# Patient Record
Sex: Female | Born: 1975 | Race: White | Hispanic: No | Marital: Single | Smoking: Current every day smoker
Health system: Southern US, Community
[De-identification: ages and names within clinical notes are randomized; demographics above are authoritative.]

## PROBLEM LIST (undated history)

## (undated) DIAGNOSIS — F32A Depression, unspecified: Secondary | ICD-10-CM

## (undated) DIAGNOSIS — E109 Type 1 diabetes mellitus without complications: Secondary | ICD-10-CM

## (undated) DIAGNOSIS — F419 Anxiety disorder, unspecified: Secondary | ICD-10-CM

## (undated) DIAGNOSIS — N83209 Unspecified ovarian cyst, unspecified side: Secondary | ICD-10-CM

## (undated) DIAGNOSIS — I209 Angina pectoris, unspecified: Secondary | ICD-10-CM

## (undated) DIAGNOSIS — F329 Major depressive disorder, single episode, unspecified: Secondary | ICD-10-CM

## (undated) DIAGNOSIS — E785 Hyperlipidemia, unspecified: Secondary | ICD-10-CM

## (undated) DIAGNOSIS — K219 Gastro-esophageal reflux disease without esophagitis: Secondary | ICD-10-CM

## (undated) HISTORY — PX: OTHER SURGICAL HISTORY: SHX169

## (undated) HISTORY — PX: COLONOSCOPY: SHX174

## (undated) HISTORY — PX: TUBAL LIGATION: SHX77

## (undated) HISTORY — PX: UPPER GASTROINTESTINAL ENDOSCOPY: SHX188

## (undated) HISTORY — PX: LAPAROSCOPIC SALPINGOOPHERECTOMY: SUR795

## (undated) HISTORY — DX: Hyperlipidemia, unspecified: E78.5

## (undated) HISTORY — PX: CARPAL TUNNEL RELEASE: SHX101

## (undated) HISTORY — DX: Major depressive disorder, single episode, unspecified: F32.9

## (undated) HISTORY — PX: ENDOMETRIAL ABLATION: SHX621

## (undated) HISTORY — DX: Depression, unspecified: F32.A

## (undated) HISTORY — PX: WISDOM TOOTH EXTRACTION: SHX21

## (undated) HISTORY — PX: ABDOMINAL HYSTERECTOMY: SHX81

## (undated) HISTORY — PX: TRIGGER FINGER RELEASE: SHX641

---

## 1998-12-20 ENCOUNTER — Other Ambulatory Visit: Admission: RE | Admit: 1998-12-20 | Discharge: 1998-12-20 | Payer: Self-pay | Admitting: Obstetrics and Gynecology

## 1999-10-02 ENCOUNTER — Other Ambulatory Visit: Admission: RE | Admit: 1999-10-02 | Discharge: 1999-10-02 | Payer: Self-pay | Admitting: Obstetrics and Gynecology

## 1999-10-27 ENCOUNTER — Observation Stay (HOSPITAL_COMMUNITY): Admission: AD | Admit: 1999-10-27 | Discharge: 1999-10-27 | Payer: Self-pay | Admitting: Obstetrics and Gynecology

## 2000-04-01 ENCOUNTER — Ambulatory Visit (HOSPITAL_COMMUNITY): Admission: RE | Admit: 2000-04-01 | Discharge: 2000-04-01 | Payer: Self-pay | Admitting: Obstetrics and Gynecology

## 2000-04-06 ENCOUNTER — Inpatient Hospital Stay (HOSPITAL_COMMUNITY): Admission: AD | Admit: 2000-04-06 | Discharge: 2000-04-09 | Payer: Self-pay | Admitting: Obstetrics and Gynecology

## 2000-05-05 ENCOUNTER — Other Ambulatory Visit: Admission: RE | Admit: 2000-05-05 | Discharge: 2000-05-05 | Payer: Self-pay | Admitting: Obstetrics and Gynecology

## 2000-06-11 ENCOUNTER — Encounter: Admission: RE | Admit: 2000-06-11 | Discharge: 2000-06-11 | Payer: Self-pay | Admitting: Obstetrics and Gynecology

## 2000-06-11 ENCOUNTER — Encounter: Payer: Self-pay | Admitting: Obstetrics and Gynecology

## 2001-05-12 ENCOUNTER — Other Ambulatory Visit: Admission: RE | Admit: 2001-05-12 | Discharge: 2001-05-12 | Payer: Self-pay | Admitting: Obstetrics and Gynecology

## 2002-05-31 ENCOUNTER — Other Ambulatory Visit: Admission: RE | Admit: 2002-05-31 | Discharge: 2002-05-31 | Payer: Self-pay | Admitting: Obstetrics and Gynecology

## 2003-06-04 ENCOUNTER — Other Ambulatory Visit: Admission: RE | Admit: 2003-06-04 | Discharge: 2003-06-04 | Payer: Self-pay | Admitting: Obstetrics and Gynecology

## 2005-02-19 ENCOUNTER — Encounter (INDEPENDENT_AMBULATORY_CARE_PROVIDER_SITE_OTHER): Payer: Self-pay | Admitting: Specialist

## 2005-02-19 ENCOUNTER — Ambulatory Visit (HOSPITAL_COMMUNITY): Admission: RE | Admit: 2005-02-19 | Discharge: 2005-02-19 | Payer: Self-pay | Admitting: Obstetrics and Gynecology

## 2007-10-07 ENCOUNTER — Ambulatory Visit (HOSPITAL_COMMUNITY): Admission: RE | Admit: 2007-10-07 | Discharge: 2007-10-07 | Payer: Self-pay | Admitting: Internal Medicine

## 2008-05-24 ENCOUNTER — Telehealth (INDEPENDENT_AMBULATORY_CARE_PROVIDER_SITE_OTHER): Payer: Self-pay | Admitting: *Deleted

## 2008-05-31 ENCOUNTER — Ambulatory Visit: Payer: Self-pay | Admitting: Gastroenterology

## 2008-05-31 DIAGNOSIS — E785 Hyperlipidemia, unspecified: Secondary | ICD-10-CM | POA: Insufficient documentation

## 2008-05-31 DIAGNOSIS — K921 Melena: Secondary | ICD-10-CM | POA: Insufficient documentation

## 2008-05-31 DIAGNOSIS — K219 Gastro-esophageal reflux disease without esophagitis: Secondary | ICD-10-CM | POA: Insufficient documentation

## 2008-05-31 DIAGNOSIS — F329 Major depressive disorder, single episode, unspecified: Secondary | ICD-10-CM

## 2008-05-31 DIAGNOSIS — F3289 Other specified depressive episodes: Secondary | ICD-10-CM | POA: Insufficient documentation

## 2008-05-31 DIAGNOSIS — K625 Hemorrhage of anus and rectum: Secondary | ICD-10-CM | POA: Insufficient documentation

## 2008-05-31 DIAGNOSIS — R197 Diarrhea, unspecified: Secondary | ICD-10-CM | POA: Insufficient documentation

## 2008-05-31 DIAGNOSIS — E119 Type 2 diabetes mellitus without complications: Secondary | ICD-10-CM | POA: Insufficient documentation

## 2008-06-01 DIAGNOSIS — E109 Type 1 diabetes mellitus without complications: Secondary | ICD-10-CM | POA: Insufficient documentation

## 2008-07-05 ENCOUNTER — Ambulatory Visit: Payer: Self-pay | Admitting: Gastroenterology

## 2008-07-11 ENCOUNTER — Telehealth: Payer: Self-pay | Admitting: Gastroenterology

## 2009-03-27 ENCOUNTER — Telehealth: Payer: Self-pay | Admitting: Gastroenterology

## 2009-08-08 ENCOUNTER — Telehealth: Payer: Self-pay | Admitting: Gastroenterology

## 2009-09-11 ENCOUNTER — Telehealth: Payer: Self-pay | Admitting: Gastroenterology

## 2009-09-12 ENCOUNTER — Ambulatory Visit: Payer: Self-pay | Admitting: Internal Medicine

## 2009-09-12 DIAGNOSIS — R1013 Epigastric pain: Secondary | ICD-10-CM | POA: Insufficient documentation

## 2009-09-16 ENCOUNTER — Ambulatory Visit (HOSPITAL_COMMUNITY): Admission: RE | Admit: 2009-09-16 | Discharge: 2009-09-16 | Payer: Self-pay | Admitting: Internal Medicine

## 2009-09-17 ENCOUNTER — Telehealth: Payer: Self-pay | Admitting: Internal Medicine

## 2009-10-07 ENCOUNTER — Telehealth: Payer: Self-pay | Admitting: Physician Assistant

## 2009-10-10 ENCOUNTER — Encounter: Payer: Self-pay | Admitting: Physician Assistant

## 2009-11-05 ENCOUNTER — Telehealth: Payer: Self-pay | Admitting: Gastroenterology

## 2009-11-18 ENCOUNTER — Ambulatory Visit: Payer: Self-pay | Admitting: Gastroenterology

## 2009-11-18 DIAGNOSIS — R079 Chest pain, unspecified: Secondary | ICD-10-CM | POA: Insufficient documentation

## 2009-12-16 ENCOUNTER — Ambulatory Visit (HOSPITAL_COMMUNITY): Admission: RE | Admit: 2009-12-16 | Discharge: 2009-12-16 | Payer: Self-pay | Admitting: Gastroenterology

## 2010-01-10 ENCOUNTER — Encounter: Admission: RE | Admit: 2010-01-10 | Discharge: 2010-01-10 | Payer: Self-pay | Admitting: Obstetrics and Gynecology

## 2010-11-27 ENCOUNTER — Telehealth: Payer: Self-pay | Admitting: Gastroenterology

## 2010-12-23 ENCOUNTER — Telehealth: Payer: Self-pay | Admitting: Gastroenterology

## 2011-01-07 ENCOUNTER — Ambulatory Visit
Admission: RE | Admit: 2011-01-07 | Discharge: 2011-01-07 | Payer: Self-pay | Source: Home / Self Care | Attending: Gastroenterology | Admitting: Gastroenterology

## 2011-01-15 NOTE — Progress Notes (Signed)
Summary: refill request  Phone Note Call from Patient Call back at [phone]   Caller: Patient Call For: Dr. Russella Dar Reason for Call: Talk to Nurse Summary of Call: needs refill of Dexilant until ov in January... [facility] [address] Initial call taken by: Vallarie Mare,  November 27, 2010 8:33 AM  Follow-up for Phone Call        Rx was sent to pts pharmacy and pt notified to keep appt in January for further refills. Follow-up by: Christie Nottingham CMA Duncan Dull),  November 27, 2010 8:37 AM    New/Updated Medications: DEXILANT 60 MG CPDR (DEXLANSOPRAZOLE) one tablet by mouth once daily Keep appt in January Prescriptions: DEXILANT 60 MG CPDR (DEXLANSOPRAZOLE) one tablet by mouth once daily Keep appt in January  #30 x 0   Entered by:   Christie Nottingham CMA (AAMA)   Authorized by:   Meryl Dare MD [facility]   Signed by:   Christie Nottingham CMA (AAMA) on 11/27/2010   Method used:   [facility]        [facility]  [address] [id number]       [address]       [place], [place]  [address]       Ph: [phone]       Fax: [phone]   RxID:   [id number]

## 2011-01-15 NOTE — Assessment & Plan Note (Signed)
Summary: REFILL DEXILANT...AS.   History of Present Illness Visit Type: Follow-up Visit Primary GI MD: Elie Goody MD [facility] Primary Provider: Geoffry Paradise, MD Requesting Provider: n/a Chief Complaint: F/u for GERD, pt denies any GI complaints. Pt needs refill on Dexilant  History of Present Illness:   Sheryl White has good control of her reflux symptoms. She occasionally notes breakthrough symptoms that respond to [facility]. Her gastric emptying scan was normal.   GI Review of Systems      Denies abdominal pain, acid reflux, belching, bloating, chest pain, dysphagia with liquids, dysphagia with solids, heartburn, loss of appetite, nausea, vomiting, vomiting blood, weight loss, and  weight gain.        Denies anal fissure, black tarry stools, change in bowel habit, constipation, diarrhea, diverticulosis, fecal incontinence, heme positive stool, hemorrhoids, irritable bowel syndrome, jaundice, light color stool, liver problems, rectal bleeding, and  rectal pain.   Current Medications (verified): 1)  Humalog Mix 50/50 50-50 %  Susp (Insulin Lispro Prot & Lispro) .... Pump 2)  Dexilant 60 Mg Cpdr (Dexlansoprazole) .... One Tablet By Mouth Once Daily Keep Appt in January 3)  Tums 500 Mg Chew (Calcium Carbonate Antacid) .... As Needed  Allergies (verified): No Known Drug Allergies  Past History:  Past Medical History: Reviewed history from 09/12/2009 and no changes required. Diabetes Mellitus 1987 GERD  Past Surgical History: Reviewed history from 09/12/2009 and no changes required. C-Section Carpal Tunnel Release Finger Release x 6  Family History: Reviewed history from 05/31/2008 and no changes required. No FH of Colon Cancer: Family History of Colon Polyps: Father Family History of Diabetes: Son, Sister  Social History: Reviewed history from 05/31/2008 and no changes required. Married Occupation: Share Engineer, materials. Patient currently smokes. 1 pack/3 days Alcohol Use -  no Daily Caffeine Use 3 cup/day Illicit Drug Use - no Patient does not get regular exercise.   Review of Systems       The pertinent positives and negatives are noted as above and in the HPI. All other ROS were reviewed and were negative.   Vital Signs:  Patient profile:   35 year old female Height:      66 inches Weight:      209 pounds BMI:     33.86 BSA:     2.04 Pulse rate:   88 / minute Pulse rhythm:   regular BP sitting:   116 / 74  (left arm) Cuff size:   regular  Vitals Entered By: Ok Anis CMA (January 07, 2011 3:28 PM)  Physical Exam  General:  Well developed, well nourished, no acute distress. obese.   Head:  Normocephalic and atraumatic. Eyes:  PERRLA, no icterus. Ears:  Normal auditory acuity. Mouth:  No deformity or lesions, dentition normal. Lungs:  Clear throughout to auscultation. Heart:  Regular rate and rhythm; no murmurs, rubs,  or bruits. Abdomen:  Soft, nontender and nondistended. No masses, hepatosplenomegaly or hernias noted. Normal bowel sounds. Psych:  Alert and cooperative. Normal mood and affect.  Impression & Recommendations:  Problem # 1:  GERD (ICD-530.81) Continue Dexilant 60 mg q.a.m. and standard antireflux measures.  Problem # 2:  DIABETES MELLITUS, TYPE I (ICD-250.01)  Patient Instructions: 1)  Your prescription has been sent to your pharmacy.  2)  Please continue current medications.  3)  Please schedule a follow-up appointment in 1 year. 4)  Copy sent to : Geoffry Paradise, MD 5)  The medication list was reviewed and reconciled.  All changed /  newly prescribed medications were explained.  A complete medication list was provided to the patient / caregiver.  Prescriptions: DEXILANT 60 MG CPDR (DEXLANSOPRAZOLE) one tablet by mouth once daily  #34 x 11   Entered by:   Christie Nottingham CMA (AAMA)   Authorized by:   Meryl Dare MD [facility]   Signed by:   Christie Nottingham CMA (AAMA) on 01/07/2011   Method used:   [facility]          [facility]  [address] [id number]       [address]       [place], [place]  [address]       Ph: [phone]       Fax: [phone]   RxID:   [id number]

## 2011-01-15 NOTE — Progress Notes (Signed)
Summary: Medication refill  Phone Note Call from Patient Call back at Home Phone [phone] Call back at [phone]   Caller: Patient Call For: Dr. Russella Dar Reason for Call: Talk to Nurse Summary of Call: Pt is calling because she only has two dexilant pills left to last untill her appt and she wants another refill Initial call taken by: [place] Johnson,  December 23, 2010 9:12 AM  Follow-up for Phone Call        Rx was sent to pts pharmacy and pt told to keep appt this month. Pt agreed. Follow-up by: Christie Nottingham CMA Duncan Dull),  December 23, 2010 9:21 AM    Prescriptions: DEXILANT 60 MG CPDR (DEXLANSOPRAZOLE) one tablet by mouth once daily Keep appt in January  #30 x 0   Entered by:   Christie Nottingham CMA (AAMA)   Authorized by:   Meryl Dare MD [facility]   Signed by:   Christie Nottingham CMA (AAMA) on 12/23/2010   Method used:   [facility]        [facility]  [address] [id number]       [address]       [place], [place]  [address]       Ph: [phone]       Fax: [phone]   RxID:   [id number]

## 2011-01-20 ENCOUNTER — Encounter: Payer: Self-pay | Admitting: Gastroenterology

## 2011-02-04 NOTE — Medication Information (Signed)
Summary: Dexilant approved/[employer]  Dexilant approved/[employer]   Imported By: Sherian Rein 01/26/2011 14:45:10  _____________________________________________________________________  External Attachment:    Type:   Image     Comment:   External Document

## 2011-04-10 ENCOUNTER — Encounter (HOSPITAL_COMMUNITY)
Admission: RE | Admit: 2011-04-10 | Discharge: 2011-04-10 | Disposition: A | Discharge status: Home / Self Care | Payer: 59 | Source: Ambulatory Visit | Attending: Obstetrics and Gynecology | Admitting: Obstetrics and Gynecology

## 2011-04-10 LAB — CBC
Hemoglobin: 15.1 g/dL — ABNORMAL HIGH (ref 12.0–15.0)
MCH: 31.4 pg (ref 26.0–34.0)
MCHC: 34.9 g/dL (ref 30.0–36.0)
MCV: 90 fL (ref 78.0–100.0)
RBC: 4.81 MIL/uL (ref 3.87–5.11)

## 2011-04-10 LAB — BASIC METABOLIC PANEL
BUN: 7 mg/dL (ref 6–23)
CO2: 25 mEq/L (ref 19–32)
Calcium: 8.9 mg/dL (ref 8.4–10.5)
Chloride: 105 mEq/L (ref 96–112)
Creatinine, Ser: 0.67 mg/dL (ref 0.4–1.2)
GFR calc Af Amer: >60 mL/min (ref >60)
Glucose, Bld: 189 mg/dL — ABNORMAL HIGH (ref 70–99)

## 2011-04-10 LAB — SURGICAL PCR SCREEN: MRSA, PCR: NEGATIVE

## 2011-04-16 ENCOUNTER — Ambulatory Visit (HOSPITAL_COMMUNITY)
Admission: RE | Admit: 2011-04-16 | Discharge: 2011-04-16 | Disposition: A | Discharge status: Home / Self Care | Payer: 59 | Source: Ambulatory Visit | Attending: Obstetrics and Gynecology | Admitting: Obstetrics and Gynecology

## 2011-04-16 ENCOUNTER — Other Ambulatory Visit: Payer: Self-pay | Admitting: Obstetrics and Gynecology

## 2011-04-16 DIAGNOSIS — Z01812 Encounter for preprocedural laboratory examination: Secondary | ICD-10-CM | POA: Insufficient documentation

## 2011-04-16 DIAGNOSIS — IMO0002 Reserved for concepts with insufficient information to code with codable children: Secondary | ICD-10-CM | POA: Insufficient documentation

## 2011-04-16 DIAGNOSIS — Z01818 Encounter for other preprocedural examination: Secondary | ICD-10-CM | POA: Insufficient documentation

## 2011-04-16 DIAGNOSIS — N803 Endometriosis of pelvic peritoneum, unspecified: Secondary | ICD-10-CM | POA: Insufficient documentation

## 2011-04-16 DIAGNOSIS — N802 Endometriosis of fallopian tube: Secondary | ICD-10-CM | POA: Insufficient documentation

## 2011-04-16 DIAGNOSIS — R1031 Right lower quadrant pain: Secondary | ICD-10-CM | POA: Insufficient documentation

## 2011-04-16 DIAGNOSIS — N80209 Endometriosis of unspecified fallopian tube, unspecified depth: Secondary | ICD-10-CM | POA: Insufficient documentation

## 2011-04-16 LAB — GLUCOSE, CAPILLARY
Glucose-Capillary: 244 mg/dL — ABNORMAL HIGH (ref 70–99)
Glucose-Capillary: 204 mg/dL — ABNORMAL HIGH (ref 70–99)
Glucose-Capillary: 255 mg/dL — ABNORMAL HIGH (ref 70–99)

## 2011-04-21 NOTE — H&P (Signed)
  Sheryl White, Sheryl White               ACCOUNT NO.:  [id number]  MEDICAL RECORD NO.:  [id number]           PATIENT TYPE:  LOCATION:                                 FACILITY:  PHYSICIAN:  Lenoard Aden, M.D.DATE OF BIRTH:  [birth year]  DATE OF ADMISSION:  04/16/2011 DATE OF DISCHARGE:                             HISTORY & PHYSICAL   CHIEF COMPLAINT:  Persistent right lower quadrant pain with dyspareunia.  She is a 35 year old white female G4, P2, history of NovaSure endometrial ablation in 2006, history of C-section in 2001, who presents now with worsening right lower quadrant pain over the last 2-3 months. She has had worsening pain during intercourse.  No vaginal bleeding status post NovaSure.  No GI or GU symptomatology and normal ultrasound. She presents now for diagnostic laparoscopy, definitive evaluation, and perform tubal ligation and possible right salpingo-oophorectomy.  ALLERGIES:  She has no known drug allergies.  MEDICATIONS:  Vicodin as needed for pain, progestin-only birth control pills, insulin, Dexilant.  She has a pregnancy history of 1 vaginal delivery, 1 C-section, and 2 miscarriages, and surgical history, otherwise, is endometrial ablation. The patient had a questionable history of a laparoscopy but this operative note cannot be found.  She has a medical history remarkable for HSV and diabetes mellitus.  PHYSICAL EXAMINATION:  GENERAL:  She is a well-developed, well-nourished white female.  Height of 5 feet 6 inches, weight of 250 pounds. HEENT:  Normal. NECK:  Supple.  Full range of motion. LUNGS:  Clear. HEART:  Regular rhythm. ABDOMEN:  Soft, nontender. PELVIC:  Uterus felt to be anteverted.  No adnexal masses. EXTREMITIES:  There are no cords. NEUROLOGIC:  Nonfocal. SKIN:  Intact.  IMPRESSION: 1. Persistent right lower quadrant pain of questionable etiology. 2. Desire for elective sterilization.  PLAN:  Diagnostic laparoscopy, possible RSO,  possible lysis of adhesions, tubal ligation.  Risks of anesthesia, infection, bleeding, injury to abdominal organs, need for repair, delayed versus immediate complications to include bowel and bladder injury.  The patient acknowledges and wishes to proceed.  Inability to __________ was also discussed despite surgical options.     Lenoard Aden, M.D.     RJT/MEDQ  D:  04/15/2011  T:  04/16/2011  Job:  [id number]  Electronically Signed by Olivia Mackie M.D. on 04/21/2011 02:45:29 PM

## 2011-04-21 NOTE — Op Note (Signed)
Sheryl White, Sheryl White               ACCOUNT NO.:  [id number]  MEDICAL RECORD NO.:  [id number]           PATIENT TYPE:  O  LOCATION:  [facility]                          [facility]  PHYSICIAN:  Lenoard Aden, M.D.DATE OF BIRTH:  1976/12/10  DATE OF PROCEDURE:  04/16/2011 DATE OF DISCHARGE:                              OPERATIVE REPORT   PREOPERATIVE DIAGNOSES: 1. Right lower quadrant pain. 2. Desire for elective sterilization.  POSTOPERATIVE DIAGNOSES: 1. Right lower quadrant pain. 2. Desire for elective sterilization. 3. Pelvic endometriosis.  PROCEDURES: 1. Diagnostic laparoscopy. 2. Laparoscopic tubal ligation of the left tube. 3. Ablation of endometriosis in the cul-de-sac. 4. Right salpingo-oophorectomy.  SURGEON:  Lenoard Aden, MD  ASSISTANT:  Marlinda Mike, CNM  ANESTHESIA:  General.  ESTIMATED BLOOD LOSS:  Less than 30 mL.  COMPLICATIONS:  None.  DRAINS:  Foley.  COUNTS:  Correct.  DISPOSITION:  The patient went to recovery in good condition.  BRIEF OPERATIVE NOTE:  After being apprised of the risks of anesthesia, infection, bleeding, injury to abdominal organs, need for repair, delayed versus immediate complications including bowel and bladder injury, possible need for repair, and inability to cure pelvic pain. The patient was brought to the operating room where she was administered general anesthetic without complications, prepped and draped in the usual sterile fashion.  Foley catheter was placed after achieving adequate anesthesia.  A Hulka tenaculum placed per vagina. Infraumbilical incision was made with a scalpel in the previous incisional area.  The fascia was identified and opened vertically using Mayo scissors.  Peritoneal entry was noted and is atraumatic.  A pursestring suture of 0-Vicryl was placed.  Hasson trocar was placed without difficulty.  Visualization of the abdomen reveals normal liver, gallbladder bed, normal appendiceal  area, pelvic endometriosis in the cul-de-sac, and right paratubal cyst.  Normal left tube and ovary, normal-appearing right ovary with possible superficial implants of endometriosis.  There is some vesicular endometriosis also with clear lesions noted in the cul-de-sac.  At this time, the two 5-mm trocar sites were placed after stab incisions were made in the right and left lower quadrants and trocar visualization and both placed atraumatically with good hemostasis.  Gyrus and atraumatic grasper entered.  Ureter was identified on the right and the infundibulopelvic ligament was transected after being coagulated on the right.  The mesosalpinx was progressively coagulated and divided using the gyrus device down to the level of the cornua where the tube was transected and the ovarian ligament was transected and the specimen was removed with placement of the cul-de-sac.  The left tube was then grasped along the ampullary isthmic portion of the tube, cauterized in 3 contiguous areas and divided using the gyrus.  In the cul-de-sac, there are some vesicular lesions which are ablated using the gyrus.  Otherwise, there was diffuse vesicular disease noted.  At this time, good hemostasis along the right surgical pedicle was noted.  The 5-mm scope was placed at the left port and the specimen was placed in EndoCatch and removed through the 12-mm trocar.  Good hemostasis was noted.  Revisualization reveals good hemostasis.  All  instruments were removed under direct visualization. Incisions were closed using 0 Vicryl, 4-0 Vicryl, and Dermabond. Instruments were removed from the vagina.  Foley catheter was removed. The patient tolerated the procedure well, was awakened, and transferred to recovery in good condition.     Lenoard Aden, M.D.     RJT/MEDQ  D:  04/16/2011  T:  04/16/2011  Job:  [id number]  Electronically Signed by Olivia Mackie M.D. on 04/21/2011 02:45:26 PM

## 2011-05-01 NOTE — H&P (Signed)
[facility]  Patient:    Sheryl White, Sheryl White                      MRN: [id number] Adm. Date:  [id number] Attending:  Lenoard Aden                         History and Physical  CHIEF COMPLAINT:              Elective cesarean section for macrosomia.  HISTORY OF PRESENT ILLNESS:   The patient is a 35 year old white female, gravida 4, para 1-0-2-1, EDD of Apr 30, 2000, at 37+ weeks with known insulin-dependent diabetes mellitus, noncompliant, mature LS PG ratio of 3.9, and positive who presents for elective cesarean section due to macrosomia and polyhydramnios.  PAST MEDICAL HISTORY:         Remarkable for an obstetric history which reveals two uncomplicated first trimester losses in 1994 and 1998.  Vaginal delivery in 1992, 8 pound 6 ounce female.  Medical problems to include longstanding insulin-dependent  diabetes mellitus noncompliant on insulin pump.  History of HSV.  History of cryosurgery in 1993.  SOCIAL HISTORY:               Noncontributory.  MEDICATIONS:                  Insulin pump and prenatal vitamins.  ALLERGIES:                    No known drug allergies.  PRENATAL LABORATORY DATA:     Remarkable for noncompliant diabetes and polyhydramnios with macrosomia.  No HSV lesions.  PHYSICAL EXAMINATION:  GENERAL:                      The patient is a well-developed, well-nourished white female in no acute distress.  HEENT:                        Normal.  LUNGS:                        Clear.  HEART:                        Regular rate and rhythm.  ABDOMEN:                      Soft, gravid, and nontender.  Estimated fetal weight 9 to 9-1/2 pounds.  PELVIC:                       Cervix is closed, long, vertex, and floating.  EXTREMITIES:                  Reveals no cords.  NEUROLOGICAL:                 Nonfocal.  IMPRESSION:                   1. 37-week intrauterine pregnancy.                               2.  Insulin-dependent diabetes mellitus.  3. Mature LS, PG, and noncompliance.  PLAN:                         Primary elective cesarean section.  Risks of anesthesia, infection, bleeding, injury to abdominal organs with need for repair is discussed.  Estimated fetal weight of 4200 grams is indication to proceed with primary elective LTCS.  The patient is apprised of the risks and the inaccuracies of ultrasound and will proceed. DD:  04/06/00 TD:  04/06/00 Job: [id number]

## 2011-05-01 NOTE — Op Note (Signed)
[facility]  Patient:    Sheryl White, Sheryl White                      MRN: [id number] Proc. Date: 04/06/00 Attending:  Lenoard Aden CC:         [facility]                           Operative Report  PREOPERATIVE DIAGNOSES:       Thirty-eight-week intrauterine pregnancy, noncompliance with insulin-dependent diabetes, with mature lecithin/sphingomyelin-prostaglandin ratio, a large-for-gestational-age fetus with polyhydramnios.  POSTOPERATIVE DIAGNOSES:      Thirty-eight-week intrauterine pregnancy, noncompliance with insulin-dependent diabetes, with mature lecithin/sphingomyelin-prostaglandin ratio, a large-for-gestational-age fetus with polyhydramnios.  PROCEDURE:                    Primary low segment transverse cesarean section.  SURGEON:                      Lenoard Aden, M.D.  ASSISTANT:                    ______ .  ANESTHESIA:                   Spinal by J. Leilani Able, Montez Hageman., M.D.  ESTIMATED BLOOD LOSS:         1000 cc.  COMPLICATIONS:                None.  DRAINS:                       Foley.  COUNTS:                       Correct.  FINDINGS:                     Full-term living female, occipitoanterior position,  9 pounds 6 ounces, Apgars 8/9, pediatricians in attendance.  Clear fluid noted.  Placenta manually and intact; three-vessel cord noted.  DESCRIPTION OF PROCEDURE:     After being apprised of the risks of anesthesia, infection, bleeding and injury to abdominal organs with need for repair, patient is brought to the operating room where she is prepped and draped in usual sterile fashion.  After having achieved adequate anesthesia via spinal anesthetic, placed in the dorsal lithotomy position and prepped and draped in the usual sterile fashion.  Foley catheter placed.  At this time, Pfannenstiel skin incision is made, after achieving adequate anesthesia, and carried down to the fascia, which is nicked in the  midline, and opened transversely using Mayo scissors.  Rectus muscles are dissected sharply in the midline, peritoneum entered sharply and bladder blade placed.  Visceroperitoneum is scored in a smile-like fashion, bladder flap developed sharply; uterus then scored in a smile-like fashion, Kerr hysterotomy  incision, for delivery of a 9-pound 6-ounce female from occipitoanterior position  using vacuum assistance, Apgars 8/9, pediatricians in attendance, clear fluid noted.  Placenta manually and intact; three-vessel cord noted.  Uterus exteriorized; normal tubes and ovaries noted.  Uterus closed using 0 Monocryl in continuous running fashion and second imbricating layer placed using a Lembert suture method.  At this time, good hemostasis is noted.  Bladder flap inspected and found to be hemostatic.  Paracolic gutters irrigated and all blood clots subsequently removed.  Rectus muscles are reapproximated in  the midline, fascia  closed using a 0 Vicryl in a continuous running fashion and skin closed using staples, after placement of a 2-0 plain suture in the subcutaneous tissue. Dilute Marcaine solution placed.  Patient tolerates procedure well and goes to recovery in good condition. DD:  04/06/00 TD:  04/07/00 Job: [id number]

## 2011-05-01 NOTE — Op Note (Signed)
Sheryl White, Sheryl White               ACCOUNT NO.:  [id number]   MEDICAL RECORD NO.:  [id number]          PATIENT TYPE:  AMB   LOCATION:  [facility]                           [facility]   PHYSICIAN:  Lenoard Aden, M.D.DATE OF BIRTH:  1976/03/02   DATE OF PROCEDURE:  02/19/2005  DATE OF DISCHARGE:                                 OPERATIVE REPORT   PREOPERATIVE DIAGNOSIS:  Menorrhagia.   POSTOPERATIVE DIAGNOSIS:  Menorrhagia.   PROCEDURE:  Diagnostic hysteroscopy, dilatation and curettage, NovaSure  endometrial ablation.   SURGEON:  Lenoard Aden, M.D.   ANESTHESIA:  General.   ESTIMATED BLOOD LOSS:  Less than 50 mL.   COMPLICATIONS:  None.   FLUID DEFICIT:  40 mL.   SPECIMENS:  Endometrial curettings to pathology.   Patient to recovery in good condition.   BRIEF OPERATIVE NOTE:  After being apprised of the risks of anesthesia,  bleeding, uterine perforation with possible need for repair, the patient was  brought to the operating room, where she was administered a general  anesthetic without complications, prepped and draped in the usual sterile  fashion, catheterized until the bladder is empty.  After achieving adequate  anesthesia, dilute Pitressin solution placed at 3 and 9 o'clock at the  cervicovaginal junction without difficulty.  The uterus is anteflexed and  sounds to 9 cm, intracervical length of 3.5 cm.  The cervix is dilated up to  a #23 Pratt dilator, hysteroscope placed.  No obvious endometrial lesions  are noted, no evidence of submucosal fibroids at this time.  D&C is  performed without difficulty, tissue sent to pathology.  Normal tubal ostia  previously noted, pictures taken.  At this time, NovaSure device is placed.  Intracavitary length of 4.6 cm is noted.  The procedure is initiated and the  total time of the procedure done in the standard fashion of 1 minute 21  seconds.  Revisualization hysteroscopically reveals the cavity to be well-  ablated, no  evidence of uterine perforation.  Please note that prior to the  ablation, the CO2 test performed was negative for any evidence of  perforation.  At this time the procedure is terminated and the patient is  awakened and transferred to recovery in good condition.    RJT/MEDQ  D:  02/19/2005  T:  02/19/2005  Job:  [id number]

## 2011-05-01 NOTE — Discharge Summary (Signed)
[facility]  Patient:    Sheryl White, Sheryl White                      MRN: [id number] Adm. Date:  [id number] Disc. Date: [id number] Attending:  Lenoard Aden                           Discharge Summary  HOSPITAL COURSE:              The patient was admitted and underwent a primary ow transverse cesarean section for noncompliance with diabetes with probable macrosomia and polyhydramnios.  She underwent uncomplicated low transverse cesarean section on April 06, 2000.  Postpartum course uncomplicated.  On insulin pump management per endocrinology.  She is discharged to home on day #3.  DISCHARGE MEDICATIONS:        1. Prenatal vitamins.                               2. Iron.                               3. Tylox.  FOLLOW-UP:                    Follow up in the office for staple removal. Postpartum approximately scheduled. DD:  04/27/00 TD:  04/27/00 Job: [id number]

## 2011-09-11 LAB — GLUCOSE, CAPILLARY: Glucose-Capillary: 174 — ABNORMAL HIGH

## 2012-01-09 ENCOUNTER — Inpatient Hospital Stay (HOSPITAL_COMMUNITY)
Admission: AD | Admit: 2012-01-09 | Discharge: 2012-01-09 | Disposition: A | Discharge status: Home / Self Care | Payer: 59 | Source: Ambulatory Visit | Attending: Obstetrics & Gynecology | Admitting: Obstetrics & Gynecology

## 2012-01-09 ENCOUNTER — Inpatient Hospital Stay (HOSPITAL_COMMUNITY): Payer: 59

## 2012-01-09 ENCOUNTER — Encounter (HOSPITAL_COMMUNITY): Payer: Self-pay | Admitting: *Deleted

## 2012-01-09 DIAGNOSIS — R1031 Right lower quadrant pain: Secondary | ICD-10-CM | POA: Diagnosis present

## 2012-01-09 DIAGNOSIS — D259 Leiomyoma of uterus, unspecified: Secondary | ICD-10-CM | POA: Insufficient documentation

## 2012-01-09 DIAGNOSIS — IMO0002 Reserved for concepts with insufficient information to code with codable children: Secondary | ICD-10-CM | POA: Insufficient documentation

## 2012-01-09 DIAGNOSIS — N949 Unspecified condition associated with female genital organs and menstrual cycle: Secondary | ICD-10-CM | POA: Insufficient documentation

## 2012-01-09 DIAGNOSIS — E118 Type 2 diabetes mellitus with unspecified complications: Secondary | ICD-10-CM | POA: Insufficient documentation

## 2012-01-09 HISTORY — DX: Unspecified ovarian cyst, unspecified side: N83.209

## 2012-01-09 LAB — URINALYSIS, ROUTINE W REFLEX MICROSCOPIC
Bilirubin Urine: NEGATIVE
Ketones, ur: 40 mg/dL — AB
Leukocytes, UA: NEGATIVE
Nitrite: NEGATIVE
Protein, ur: NEGATIVE mg/dL
pH: 6 (ref 5.0–8.0)

## 2012-01-09 LAB — URINE MICROSCOPIC-ADD ON

## 2012-01-09 MED ORDER — HYDROCODONE-ACETAMINOPHEN 10-325 MG PO TABS: 1.0000 | ORAL_TABLET | ORAL | Status: DC

## 2012-01-09 MED ORDER — TRAMADOL HCL 50 MG PO TABS: ORAL_TABLET | ORAL | Status: DC

## 2012-01-09 MED ORDER — OXYCODONE HCL 15 MG PO TB12
15.0000 mg | ORAL_TABLET | Freq: Once | ORAL | Status: AC
  Administered 2012-01-09: 15 mg via ORAL

## 2012-01-09 NOTE — ED Provider Notes (Signed)
History     Chief Complaint  Patient presents with  . Abdominal Pain  . Vaginal Bleeding   Abdominal Pain This is a new problem. The current episode started in the past 7 days. The onset quality is sudden. The problem occurs constantly. The most recent episode lasted 3 days. The problem has been unchanged. The pain is located in the RLQ. The pain is at a severity of 6/10. The pain is moderate. The quality of the pain is sharp and aching. The abdominal pain radiates to the back. Pertinent negatives include no anorexia, constipation, diarrhea, dysuria, fever, frequency, headaches, nausea or vomiting. The pain is aggravated by nothing. The pain is relieved by nothing. Her past medical history is significant for abdominal surgery.  Vaginal Bleeding Associated symptoms include abdominal pain. Pertinent negatives include no anorexia, chills, constipation, diarrhea, dysuria, fever, frequency, headaches, nausea, urgency or vomiting. Her past medical history is significant for an abdominal surgery.   Spotting red on Thursday with onset of sudden pain right lower abdomen. Brown spotting since Thursday. Ablation 5 years ago / removal of right tube and ovary in May 2012. Hx endometriosis. Chronic pelvic pain for years - no improvement in pain with removal of right ovary - pain same in right lower quadrant monthly with cycle. This month worse than others.  Takes Vicodin 2 tablets every 4-6 hours with acute episodes of pain - occasional use of Motrin. Last meds today Vicodin 2 tablets at 2pm / no NSAIDS.  CBG - 253 at 1600 /treated with insulin bolus per SS.    Past Medical History  Diagnosis Date  . Endometriosis   . Ovarian cyst     Past Surgical History  Procedure Date  . Laparoscopic salpingoopherectomy   . Endometrial ablation   . Wisdom tooth extraction   . Tubal ligation     History reviewed. No pertinent family history.  History  Substance Use Topics  . Smoking status: Current  Everyday Smoker -- 1.0 packs/day  . Smokeless tobacco: Not on file  . Alcohol Use: 0.0 oz/week    2-3 drink(s) per week     occasionally    Allergies: No Known Allergies  Prescriptions prior to admission  Medication Sig Dispense Refill  . ALPRAZolam (XANAX) 0.25 MG tablet Take 0.25 mg by mouth 2 (two) times daily as needed. anxiety      . dexlansoprazole (DEXILANT) 60 MG capsule Take 60 mg by mouth daily.      Marland Kitchen HYDROcodone-acetaminophen (VICODIN) 5-500 MG per tablet Take 1-2 tablets by mouth every 4 (four) hours as needed. pain      . Insulin Human (INSULIN PUMP) 100 unit/ml SOLN Inject into the skin. Pt uses Humalog insulin in pump, she could not give a range of units used daily.        Review of Systems  Constitutional: Negative for fever, chills and malaise/fatigue.  Respiratory: Negative.   Cardiovascular: Negative.   Gastrointestinal: Positive for abdominal pain. Negative for nausea, vomiting, diarrhea, constipation and anorexia.  Genitourinary: Positive for vaginal bleeding. Negative for dysuria, urgency and frequency.  Musculoskeletal: Negative.   Skin: Negative.   Neurological: Negative for weakness and headaches.  Psychiatric/Behavioral: Negative.    Physical Exam   Blood pressure 116/67, pulse 85, temperature 98.2 F (36.8 C), temperature source Oral, height 5\' 6"  (1.676 m), weight 87.816 kg (193 lb 9.6 oz).  Physical Exam  Constitutional: She is oriented to person, place, and time. She appears well-developed and well-nourished.  Neck: Normal range  of motion.  Cardiovascular: Normal rate and normal heart sounds.   Respiratory: Effort normal and breath sounds normal.  GI: Soft. Bowel sounds are normal. She exhibits no distension. There is no tenderness. There is no rebound and no guarding.  Genitourinary: Uterus normal. Vaginal discharge found.       Bimanual exam:  uterus without enlargement / non-tender Adnexa without masses / mildly tender Left adnexa Scant  tan-brown discharge   Musculoskeletal: Normal range of motion.  Neurological: She is alert and oriented to person, place, and time.  Skin: Skin is warm.    MAU Course  Procedures  Urinalysis - negative except 1000 glucose with + ketones Pelvic sono pending  Assessment and Plan   Pelvic pain           Etiology possible cyclic endometrial pain versus rupture ovarian cyst (left) versus adenomyosis  Uncontrolled diabetes  1) pelvic sono 2) analgesia - change narcotic med to remove acetaminophen                                     (uncontrolled diabetes with frequent analgesia use)  BAILEY,TANYA 01/09/2012, 6:55 PM   Addendum : 01/09/2012 at 2000  SONO reviewed - Left ovary normal - no evidence of cyst                               Uterus several small fibroids                                Developing adenomyosis s/p ablation  Discharge to home   call [facility] to be seen by Dr Billy Coast for definitive management  call endocrinologist for uncontrolled diabetes  RX for #45 Ultram 50 mg 1-2 tablet every 6-8 hours prn moderate to severe pain

## 2012-01-09 NOTE — Progress Notes (Signed)
Pain in RLQ since Thursday (2 days).  On Vicodin for chronic pain in this area.  Has h/o endometriosis, had uterine ablation more than 5 yrs ago for heavy bleeding, R salpingo-oophorectomy for endometriosis May 2012.  Called nurse line, was told to come to MAU for increased pain.

## 2012-01-09 NOTE — Progress Notes (Signed)
Patient had ablation about 5 years ago and right ovary and tube removed, onset of right lower abdominal pain since yesterday worse today, light spotting.

## 2012-01-20 ENCOUNTER — Other Ambulatory Visit: Payer: Self-pay | Admitting: Gastroenterology

## 2012-01-20 NOTE — Telephone Encounter (Signed)
NEEDS OFFICE VISIT FOR ANY FURTHER REFILLS! 

## 2012-02-01 ENCOUNTER — Encounter (HOSPITAL_COMMUNITY): Payer: Self-pay | Admitting: Pharmacist

## 2012-02-09 ENCOUNTER — Other Ambulatory Visit: Payer: Self-pay | Admitting: Obstetrics and Gynecology

## 2012-02-09 ENCOUNTER — Encounter (HOSPITAL_COMMUNITY): Payer: Self-pay

## 2012-02-09 ENCOUNTER — Encounter (HOSPITAL_COMMUNITY)
Admission: RE | Admit: 2012-02-09 | Discharge: 2012-02-09 | Disposition: A | Discharge status: Home / Self Care | Payer: 59 | Source: Ambulatory Visit | Attending: Obstetrics and Gynecology | Admitting: Obstetrics and Gynecology

## 2012-02-09 HISTORY — DX: Angina pectoris, unspecified: I20.9

## 2012-02-09 HISTORY — DX: Gastro-esophageal reflux disease without esophagitis: K21.9

## 2012-02-09 HISTORY — DX: Anxiety disorder, unspecified: F41.9

## 2012-02-09 LAB — BASIC METABOLIC PANEL
CO2: 28 mEq/L (ref 19–32)
Chloride: 100 mEq/L (ref 96–112)
GFR calc Af Amer: >90 mL/min (ref >90)
Potassium: 4.1 mEq/L (ref 3.5–5.1)

## 2012-02-09 LAB — SURGICAL PCR SCREEN: Staphylococcus aureus: POSITIVE — AB

## 2012-02-09 LAB — CBC
HCT: 42.2 % (ref 36.0–46.0)
Platelets: 331 10*3/uL (ref 150–400)
RBC: 4.68 MIL/uL (ref 3.87–5.11)
RDW: 12.1 % (ref 11.5–15.5)
WBC: 8.3 10*3/uL (ref 4.0–10.5)

## 2012-02-09 NOTE — Patient Instructions (Signed)
YOUR PROCEDURE IS SCHEDULED ON:02/19/12 ENTER THROUGH THE MAIN ENTRANCE OF [facility] AT:6am  USE DESK PHONE AND DIAL [phone] TO INFORM us OF YOUR ARRIVAL  CALL [phone] IF YOU HAVE ANY QUESTIONS OR PROBLEMS PRIOR TO YOUR ARRIVAL.  REMEMBER: DO NOT EAT OR DRINK AFTER MIDNIGHT :Thursday  SPECIAL INSTRUCTIONS:   YOU MAY BRUSH YOUR TEETH THE MORNING OF SURGERY   TAKE THESE MEDICINES THE DAY OF SURGERY WITH SIP OF WATER:Dexilant   DO NOT WEAR JEWELRY, EYE MAKEUP, LIPSTICK OR DARK FINGERNAIL POLISH DO NOT WEAR LOTIONS  DO NOT SHAVE FOR 48 HOURS PRIOR TO SURGERY  YOU WILL NOT BE ALLOWED TO DRIVE YOURSELF HOME.  NAME OF DRIVER:Victoria- mother

## 2012-02-10 NOTE — Pre-Procedure Instructions (Addendum)
Reviewed glucose results with Dr Kirtland Bouchard Jackson-(glucose 423 received on 02/09/12 at 1605) Instructed to notify Dr Billy Coast office for optimal control prior to surgery on 02/19/12. Message left with Birdena Crandall to return call Shanelle returned call: aware of need for Dr Billy Coast to better control blood glucose level for [facility]

## 2012-02-18 MED ORDER — CEFAZOLIN SODIUM-DEXTROSE 2-3 GM-% IV SOLR
2.0000 g | INTRAVENOUS | Status: AC
Start: 2012-02-19 — End: 2012-02-19
  Administered 2012-02-19: 2 g via INTRAVENOUS
  Filled 2012-02-18: qty 50

## 2012-02-19 ENCOUNTER — Encounter (HOSPITAL_COMMUNITY): Payer: Self-pay | Admitting: *Deleted

## 2012-02-19 ENCOUNTER — Encounter (HOSPITAL_COMMUNITY): Payer: Self-pay | Admitting: Anesthesiology

## 2012-02-19 ENCOUNTER — Ambulatory Visit (HOSPITAL_COMMUNITY): Payer: 59 | Admitting: Anesthesiology

## 2012-02-19 ENCOUNTER — Ambulatory Visit (HOSPITAL_COMMUNITY)
Admission: RE | Admit: 2012-02-19 | Discharge: 2012-02-19 | Disposition: A | Discharge status: Home / Self Care | Payer: 59 | Source: Ambulatory Visit | Attending: Obstetrics and Gynecology | Admitting: Obstetrics and Gynecology

## 2012-02-19 ENCOUNTER — Encounter (HOSPITAL_COMMUNITY)
Admission: RE | Disposition: A | Discharge status: Home / Self Care | Payer: Self-pay | Source: Ambulatory Visit | Attending: Obstetrics and Gynecology

## 2012-02-19 DIAGNOSIS — N949 Unspecified condition associated with female genital organs and menstrual cycle: Secondary | ICD-10-CM | POA: Insufficient documentation

## 2012-02-19 DIAGNOSIS — N80109 Endometriosis of ovary, unspecified side, unspecified depth: Secondary | ICD-10-CM | POA: Insufficient documentation

## 2012-02-19 DIAGNOSIS — E119 Type 2 diabetes mellitus without complications: Secondary | ICD-10-CM | POA: Insufficient documentation

## 2012-02-19 DIAGNOSIS — N801 Endometriosis of ovary: Secondary | ICD-10-CM | POA: Insufficient documentation

## 2012-02-19 DIAGNOSIS — N946 Dysmenorrhea, unspecified: Secondary | ICD-10-CM | POA: Insufficient documentation

## 2012-02-19 DIAGNOSIS — N920 Excessive and frequent menstruation with regular cycle: Secondary | ICD-10-CM | POA: Insufficient documentation

## 2012-02-19 LAB — GLUCOSE, CAPILLARY
Glucose-Capillary: 95 mg/dL (ref 70–99)
Glucose-Capillary: 117 mg/dL — ABNORMAL HIGH (ref 70–99)
Glucose-Capillary: 182 mg/dL — ABNORMAL HIGH (ref 70–99)

## 2012-02-19 LAB — CBC
HCT: 43.3 % (ref 36.0–46.0)
Hemoglobin: 15.1 g/dL — ABNORMAL HIGH (ref 12.0–15.0)
MCH: 31.1 pg (ref 26.0–34.0)
MCHC: 34.9 g/dL (ref 30.0–36.0)
MCV: 89.3 fL (ref 78.0–100.0)
RBC: 4.85 MIL/uL (ref 3.87–5.11)

## 2012-02-19 LAB — HCG, SERUM, QUALITATIVE: Preg, Serum: NEGATIVE

## 2012-02-19 SURGERY — ROBOTIC ASSISTED TOTAL HYSTERECTOMY
Anesthesia: General | Wound class: Clean Contaminated

## 2012-02-19 MED ORDER — NEOSTIGMINE METHYLSULFATE 1 MG/ML IJ SOLN
INTRAMUSCULAR | Status: AC
  Filled 2012-02-19: qty 10

## 2012-02-19 MED ORDER — HYDROMORPHONE 0.3 MG/ML IV SOLN
INTRAVENOUS | Status: DC
  Administered 2012-02-19: 13:00:00 via INTRAVENOUS

## 2012-02-19 MED ORDER — ARTIFICIAL TEARS OP OINT
TOPICAL_OINTMENT | OPHTHALMIC | Status: DC | PRN
  Administered 2012-02-19: 1 via OPHTHALMIC

## 2012-02-19 MED ORDER — TRAMADOL HCL 50 MG PO TABS: 50.0000 mg | ORAL_TABLET | Freq: Four times a day (QID) | ORAL | Status: DC | PRN

## 2012-02-19 MED ORDER — PROPOFOL 10 MG/ML IV EMUL
INTRAVENOUS | Status: AC
  Filled 2012-02-19: qty 40

## 2012-02-19 MED ORDER — DIPHENHYDRAMINE HCL 12.5 MG/5ML PO ELIX
12.5000 mg | ORAL_SOLUTION | Freq: Four times a day (QID) | ORAL | Status: DC | PRN
  Filled 2012-02-19: qty 5

## 2012-02-19 MED ORDER — MIDAZOLAM HCL 5 MG/5ML IJ SOLN
INTRAMUSCULAR | Status: DC | PRN
  Administered 2012-02-19: 2 mg via INTRAVENOUS

## 2012-02-19 MED ORDER — BUPIVACAINE HCL (PF) 0.25 % IJ SOLN
INTRAMUSCULAR | Status: DC | PRN
  Administered 2012-02-19: 10 mL

## 2012-02-19 MED ORDER — ONDANSETRON HCL 4 MG/2ML IJ SOLN: 4.0000 mg | Freq: Four times a day (QID) | INTRAMUSCULAR | Status: DC | PRN

## 2012-02-19 MED ORDER — ZOLPIDEM TARTRATE 5 MG PO TABS: 5.0000 mg | ORAL_TABLET | Freq: Every evening | ORAL | Status: DC | PRN

## 2012-02-19 MED ORDER — LACTATED RINGERS IV SOLN
INTRAVENOUS | Status: DC
  Administered 2012-02-19: 10:00:00 via INTRAVENOUS
  Administered 2012-02-19: 125 mL/h via INTRAVENOUS
  Administered 2012-02-19: 08:00:00 via INTRAVENOUS

## 2012-02-19 MED ORDER — DEXAMETHASONE SODIUM PHOSPHATE 10 MG/ML IJ SOLN
INTRAMUSCULAR | Status: AC
  Filled 2012-02-19: qty 1

## 2012-02-19 MED ORDER — LIDOCAINE HCL (CARDIAC) 20 MG/ML IV SOLN
INTRAVENOUS | Status: AC
  Filled 2012-02-19: qty 5

## 2012-02-19 MED ORDER — BUPIVACAINE HCL (PF) 0.25 % IJ SOLN
INTRAMUSCULAR | Status: AC
  Filled 2012-02-19: qty 30

## 2012-02-19 MED ORDER — ONDANSETRON HCL 4 MG/2ML IJ SOLN
INTRAMUSCULAR | Status: AC
  Filled 2012-02-19: qty 2

## 2012-02-19 MED ORDER — LACTATED RINGERS IV SOLN
INTRAVENOUS | Status: DC
  Administered 2012-02-19: 18:00:00 via INTRAVENOUS

## 2012-02-19 MED ORDER — PROPOFOL 10 MG/ML IV EMUL
INTRAVENOUS | Status: DC | PRN
  Administered 2012-02-19: 150 mg via INTRAVENOUS

## 2012-02-19 MED ORDER — NEOSTIGMINE METHYLSULFATE 1 MG/ML IJ SOLN
INTRAMUSCULAR | Status: DC | PRN
  Administered 2012-02-19: 2 mg via INTRAVENOUS

## 2012-02-19 MED ORDER — ROCURONIUM BROMIDE 100 MG/10ML IV SOLN
INTRAVENOUS | Status: DC | PRN
  Administered 2012-02-19: 10 mg via INTRAVENOUS
  Administered 2012-02-19: 50 mg via INTRAVENOUS

## 2012-02-19 MED ORDER — INSULIN PUMP
Freq: Three times a day (TID) | SUBCUTANEOUS | Status: DC
  Filled 2012-02-19: qty 1

## 2012-02-19 MED ORDER — LIDOCAINE HCL (CARDIAC) 20 MG/ML IV SOLN
INTRAVENOUS | Status: DC | PRN
  Administered 2012-02-19: 60 mg via INTRAVENOUS

## 2012-02-19 MED ORDER — HYDROMORPHONE 0.3 MG/ML IV SOLN
INTRAVENOUS | Status: AC
  Filled 2012-02-19: qty 25

## 2012-02-19 MED ORDER — SUFENTANIL CITRATE 50 MCG/ML IV SOLN
INTRAVENOUS | Status: DC | PRN
  Administered 2012-02-19 (×5): 10 ug via INTRAVENOUS

## 2012-02-19 MED ORDER — GLYCOPYRROLATE 0.2 MG/ML IJ SOLN
INTRAMUSCULAR | Status: DC | PRN
  Administered 2012-02-19: 0.4 mg via INTRAVENOUS

## 2012-02-19 MED ORDER — SODIUM CHLORIDE 0.9 % IJ SOLN: 9.0000 mL | INTRAMUSCULAR | Status: DC | PRN

## 2012-02-19 MED ORDER — ONDANSETRON HCL 4 MG/2ML IJ SOLN
INTRAMUSCULAR | Status: DC | PRN
  Administered 2012-02-19: 4 mg via INTRAVENOUS

## 2012-02-19 MED ORDER — DIPHENHYDRAMINE HCL 50 MG/ML IJ SOLN: 12.5000 mg | Freq: Four times a day (QID) | INTRAMUSCULAR | Status: DC | PRN

## 2012-02-19 MED ORDER — FENTANYL CITRATE 0.05 MG/ML IJ SOLN
25.0000 ug | INTRAMUSCULAR | Status: DC | PRN
  Administered 2012-02-19 (×2): 50 ug via INTRAVENOUS

## 2012-02-19 MED ORDER — SUFENTANIL CITRATE 50 MCG/ML IV SOLN
INTRAVENOUS | Status: AC
  Filled 2012-02-19: qty 1

## 2012-02-19 MED ORDER — FENTANYL CITRATE 0.05 MG/ML IJ SOLN
INTRAMUSCULAR | Status: AC
  Administered 2012-02-19: 50 ug via INTRAVENOUS
  Filled 2012-02-19: qty 2

## 2012-02-19 MED ORDER — ROCURONIUM BROMIDE 50 MG/5ML IV SOLN
INTRAVENOUS | Status: AC
  Filled 2012-02-19: qty 1

## 2012-02-19 MED ORDER — DEXAMETHASONE SODIUM PHOSPHATE 10 MG/ML IJ SOLN
INTRAMUSCULAR | Status: DC | PRN
  Administered 2012-02-19: 10 mg via INTRAVENOUS

## 2012-02-19 MED ORDER — HYDROCODONE-ACETAMINOPHEN 5-325 MG PO TABS
1.0000 | ORAL_TABLET | Freq: Once | ORAL | Status: AC
  Administered 2012-02-19: 2 via ORAL
  Filled 2012-02-19: qty 2

## 2012-02-19 MED ORDER — NALOXONE HCL 0.4 MG/ML IJ SOLN: 0.4000 mg | INTRAMUSCULAR | Status: DC | PRN

## 2012-02-19 MED ORDER — TRAMADOL HCL 50 MG PO TABS: 100.0000 mg | ORAL_TABLET | Freq: Four times a day (QID) | ORAL | Status: DC | PRN

## 2012-02-19 MED ORDER — OXYCODONE-ACETAMINOPHEN 5-325 MG PO TABS: 1.0000 | ORAL_TABLET | ORAL | Status: DC | PRN

## 2012-02-19 MED ORDER — GLYCOPYRROLATE 0.2 MG/ML IJ SOLN
INTRAMUSCULAR | Status: AC
  Filled 2012-02-19: qty 2

## 2012-02-19 MED ORDER — MIDAZOLAM HCL 2 MG/2ML IJ SOLN
INTRAMUSCULAR | Status: AC
  Filled 2012-02-19: qty 2

## 2012-02-19 SURGICAL SUPPLY — 73 items
ADH SKN CLS APL DERMABOND .7 (GAUZE/BANDAGES/DRESSINGS) ×3
BAG URINE DRAINAGE (UROLOGICAL SUPPLIES) ×4 IMPLANT
BARRIER ADHS 3X4 INTERCEED (GAUZE/BANDAGES/DRESSINGS) ×4 IMPLANT
BRR ADH 4X3 ABS CNTRL BYND (GAUZE/BANDAGES/DRESSINGS) ×3
CABLE HIGH FREQUENCY MONO STRZ (ELECTRODE) ×4 IMPLANT
CATH FOLEY 3WAY  5CC 16FR (CATHETERS) ×1
CATH FOLEY 3WAY 5CC 16FR (CATHETERS) ×3 IMPLANT
CHLORAPREP W/TINT 26ML (MISCELLANEOUS) ×4 IMPLANT
CLOTH BEACON ORANGE TIMEOUT ST (SAFETY) ×4 IMPLANT
CONT PATH 16OZ SNAP LID 3702 (MISCELLANEOUS) ×4 IMPLANT
COVER MAYO STAND STRL (DRAPES) ×4 IMPLANT
COVER TABLE BACK 60X90 (DRAPES) ×8 IMPLANT
COVER TIP SHEARS 8 DVNC (MISCELLANEOUS) ×3 IMPLANT
COVER TIP SHEARS 8MM DA VINCI (MISCELLANEOUS) ×1
DECANTER SPIKE VIAL GLASS SM (MISCELLANEOUS) ×4 IMPLANT
DERMABOND ADVANCED (GAUZE/BANDAGES/DRESSINGS) ×1
DERMABOND ADVANCED .7 DNX12 (GAUZE/BANDAGES/DRESSINGS) ×3 IMPLANT
DRAPE HUG U DISPOSABLE (DRAPE) ×4 IMPLANT
DRAPE LG THREE QUARTER DISP (DRAPES) ×8 IMPLANT
DRAPE MONITOR DA VINCI (DRAPE) IMPLANT
DRAPE WARM FLUID 44X44 (DRAPE) ×4 IMPLANT
ELECT REM PT RETURN 9FT ADLT (ELECTROSURGICAL) ×4
ELECTRODE REM PT RTRN 9FT ADLT (ELECTROSURGICAL) ×3 IMPLANT
EVACUATOR SMOKE 8.L (FILTER) ×4 IMPLANT
GAUZE VASELINE 3X9 (GAUZE/BANDAGES/DRESSINGS) IMPLANT
GLOVE BIO SURGEON STRL SZ7.5 (GLOVE) ×12 IMPLANT
GOWN PREVENTION PLUS XLARGE (GOWN DISPOSABLE) ×4 IMPLANT
GOWN STRL REIN XL XLG (GOWN DISPOSABLE) ×24 IMPLANT
GYRUS RUMI II 2.5CM BLUE (DISPOSABLE)
GYRUS RUMI II 3.5CM BLUE (DISPOSABLE)
GYRUS RUMI II 4.0CM BLUE (DISPOSABLE)
KIT ACCESSORY DA VINCI DISP (KITS) ×1
KIT ACCESSORY DVNC DISP (KITS) ×3 IMPLANT
KIT DISP ACCESSORY 4 ARM (KITS) IMPLANT
NDL INSUFFLATION 14GA 120MM (NEEDLE) ×2 IMPLANT
NEEDLE INSUFFLATION 14GA 120MM (NEEDLE) ×4 IMPLANT
PACK LAVH (CUSTOM PROCEDURE TRAY) ×4 IMPLANT
PAD PREP 24X48 CUFFED NSTRL (MISCELLANEOUS) ×8 IMPLANT
PLUG CATH AND CAP STER (CATHETERS) ×4 IMPLANT
POSITIONER SURGICAL ARM (MISCELLANEOUS) ×8 IMPLANT
PROTECTOR NERVE ULNAR (MISCELLANEOUS) ×8 IMPLANT
RUMI II 3.0CM BLUE KOH-EFFICIE (DISPOSABLE) ×2 IMPLANT
RUMI II GYRUS 2.5CM BLUE (DISPOSABLE) IMPLANT
RUMI II GYRUS 3.5CM BLUE (DISPOSABLE) IMPLANT
RUMI II GYRUS 4.0CM BLUE (DISPOSABLE) IMPLANT
SET IRRIG TUBING LAPAROSCOPIC (IRRIGATION / IRRIGATOR) ×4 IMPLANT
SOLUTION ELECTROLUBE (MISCELLANEOUS) ×4 IMPLANT
SPONGE LAP 18X18 X RAY DECT (DISPOSABLE) IMPLANT
SUT VIC AB 0 CT1 27 (SUTURE) ×8
SUT VIC AB 0 CT1 27XBRD ANBCTR (SUTURE) ×6 IMPLANT
SUT VIC AB 0 CT1 27XBRD ANTBC (SUTURE) IMPLANT
SUT VICRYL 0 UR6 27IN ABS (SUTURE) ×4 IMPLANT
SUT VICRYL RAPIDE 4/0 PS 2 (SUTURE) ×8 IMPLANT
SUT VLOC 180 0 6IN GS21 (SUTURE) ×2 IMPLANT
SUT VLOC 180 0 9IN  GS21 (SUTURE)
SUT VLOC 180 0 9IN GS21 (SUTURE) IMPLANT
SYR 50ML LL SCALE MARK (SYRINGE) ×4 IMPLANT
SYRINGE 10CC LL (SYRINGE) ×4 IMPLANT
SYSTEM CONVERTIBLE TROCAR (TROCAR) IMPLANT
TIP UTERINE 5.1X6CM LAV DISP (MISCELLANEOUS) IMPLANT
TIP UTERINE 6.7X10CM GRN DISP (MISCELLANEOUS) IMPLANT
TIP UTERINE 6.7X6CM WHT DISP (MISCELLANEOUS) IMPLANT
TIP UTERINE 6.7X8CM BLUE DISP (MISCELLANEOUS) ×2 IMPLANT
TOWEL OR 17X24 6PK STRL BLUE (TOWEL DISPOSABLE) ×12 IMPLANT
TROCAR DISP BLADELESS 8 DVNC (TROCAR) ×3 IMPLANT
TROCAR DISP BLADELESS 8MM (TROCAR) ×1
TROCAR XCEL 12X100 BLDLESS (ENDOMECHANICALS) IMPLANT
TROCAR XCEL NON-BLD 5MMX100MML (ENDOMECHANICALS) ×4 IMPLANT
TROCAR Z-THREAD 12X150 (TROCAR) ×4 IMPLANT
TROCAR Z-THREAD FIOS 12X100MM (TROCAR) IMPLANT
TUBING FILTER THERMOFLATOR (ELECTROSURGICAL) ×4 IMPLANT
WARMER LAPAROSCOPE (MISCELLANEOUS) ×4 IMPLANT
WATER STERILE IRR 1000ML POUR (IV SOLUTION) ×12 IMPLANT

## 2012-02-19 NOTE — Progress Notes (Signed)
Pt checked CBG 147 self administered 5.5 units via insulin pump.

## 2012-02-19 NOTE — OR Nursing (Signed)
Patient has insulin pump - right lower abd.  CBG in SS was 179.

## 2012-02-19 NOTE — OR Nursing (Signed)
Dr Cristela Blue informed of cbg 179 and patient adjusted her insulin pump for that result.  No additional orders given.

## 2012-02-19 NOTE — Anesthesia Postprocedure Evaluation (Signed)
Anesthesia Post Note  Patient: Sheryl White  Procedure(s) Performed: Procedure(s) (LRB): ROBOTIC ASSISTED TOTAL HYSTERECTOMY (N/A) ROBOTIC ASSISTED BILATERAL SALPINGO OOPHERECTOMY (Left)  Anesthesia type: GA  Patient location: PACU  Post pain: Pain level controlled  Post assessment: Post-op Vital signs reviewed  Last Vitals:  Filed Vitals:   02/19/12 1100  BP: 133/77  Pulse: 68  Temp:   Resp: 18    Post vital signs: Reviewed  Level of consciousness: sedated  Complications: No apparent anesthesia complications

## 2012-02-19 NOTE — Progress Notes (Signed)
Patient ID: Sheryl White, female   DOB: 12-18-1975, 36 y.o.   MRN: [id number] Patient seen and examined. Consent witnessed and signed. No changes noted. Update completed.

## 2012-02-19 NOTE — Anesthesia Preprocedure Evaluation (Signed)
Anesthesia Evaluation  Patient identified by MRN, date of birth, ID band Patient awake    Reviewed: Allergy & Precautions, H&P , Patient's Chart, lab work & pertinent test results, reviewed documented beta blocker date and time   Airway Mallampati: II TM Distance: >3 FB Neck ROM: full    Dental No notable dental hx.    Pulmonary  breath sounds clear to auscultation  Pulmonary exam normal       Cardiovascular Rhythm:regular Rate:Normal     Neuro/Psych    GI/Hepatic GERD-  ,  Endo/Other  Diabetes mellitus- (present BS 170 in DS; was 200 this am earlier.She may have to d/c pump as it is close to surgical site), Insulin Dependent  Renal/GU      Musculoskeletal   Abdominal   Peds  Hematology   Anesthesia Other Findings   Reproductive/Obstetrics                           Anesthesia Physical Anesthesia Plan  ASA: III  Anesthesia Plan: General   Post-op Pain Management:    Induction: Intravenous  Airway Management Planned: Oral ETT  Additional Equipment:   Intra-op Plan:   Post-operative Plan:   Informed Consent: I have reviewed the patients History and Physical, chart, labs and discussed the procedure including the risks, benefits and alternatives for the proposed anesthesia with the patient or authorized representative who has indicated his/her understanding and acceptance.   Dental Advisory Given and Dental advisory given  Plan Discussed with: CRNA and Surgeon  Anesthesia Plan Comments: (  Discussed  general anesthesia, including possible nausea, instrumentation of airway, sore throat,pulmonary aspiration, etc. I asked if the were any outstanding questions, or  concerns before we proceeded. )        Anesthesia Quick Evaluation

## 2012-02-19 NOTE — Transfer of Care (Signed)
Immediate Anesthesia Transfer of Care Note  Patient: Sheryl White  Procedure(s) Performed: Procedure(s) (LRB): ROBOTIC ASSISTED TOTAL HYSTERECTOMY (N/A) ROBOTIC ASSISTED BILATERAL SALPINGO OOPHERECTOMY (Left)  Patient Location: PACU  Anesthesia Type: General  Level of Consciousness: oriented and sedated  Airway & Oxygen Therapy: Patient Spontanous Breathing and Patient connected to nasal cannula oxygen  Post-op Assessment: Report given to PACU RN and Post -op Vital signs reviewed and stable  Post vital signs: stable  Complications: No apparent anesthesia complications

## 2012-02-19 NOTE — Progress Notes (Signed)
Discharge instructions given to and reviewed with patient.  Pt verbalized understanding of discharge instructions.2nd copy of discharge instructions signed and place in patient shadow chart. Pt escorted out by   [employer].                     Marland Kitchen

## 2012-02-19 NOTE — H&P (Signed)
Sheryl White, Sheryl White               ACCOUNT NO.:  [id number]  MEDICAL RECORD NO.:  [id number]  LOCATION:                                 FACILITY:  PHYSICIAN:  Lenoard Aden, M.D.DATE OF BIRTH:  1976/03/21  DATE OF ADMISSION:  02/19/2012 DATE OF DISCHARGE:                             HISTORY & PHYSICAL   CHIEF COMPLAINT:  Dysmenorrhea and pelvic pain.  HISTORY OF PRESENT ILLNESS:  A 36 year old white female, G4, P2 with a history of a NovaSure endometrial ablation for dysmenorrhea and menorrhagia, then status post right salpingectomy in 2012, who presents now with persistent dysmenorrhea and discomfort for definitive therapy.  She has no known drug allergies.  Medications are Vicodin as needed, Xanax as needed, insulin and Dexilant.  PAST SURGICAL HISTORY:  Remarkable for laparoscopy as noted, tubal ligation, salpingectomy, and NovaSure ablation, two vaginal deliveries.  PHYSICAL EXAMINATION:  GENERAL:  This is a well-developed, well- nourished white female, in no acute distress. VITAL SIGNS:  Height of 66-1/2 inches, weight of 196 pounds. HEENT:  Normal. NECK:  Supple, full range of motion. LUNGS:  Clear. HEART:  Regular rhythm. ABDOMEN:  Soft, gravid, nontender. PELVIC:  Reveals uterus to be anteflexed with no adnexal masses appreciated.  FAMILY HISTORY:  Remarkable for diabetes, lung cancer, lupus, and hypertension.  PERSONAL MEDICAL HISTORY:  Remarkable for genital herpes, dysmenorrhea, and type 2 diabetes.  IMPRESSION:  Persistent dysmenorrhea and menorrhagia despite NovaSure ablation for definitive therapy.  PLAN:  Proceed with da-Vinci assisted total laparoscopic hysterectomy, unilateral salpingectomy, risks of anesthesia, infection, bleeding, injury to abdominal organs, need for repair was discussed, delayed versus immediate complications to include bowel and bladder injury noted, inability to cure all pelvic pain was discussed.  The  patient acknowledges, wishes to proceed.  We will check BMP prior surgery.     Lenoard Aden, M.D.     RJT/MEDQ  D:  02/18/2012  T:  02/19/2012  Job:  [id number]

## 2012-02-19 NOTE — Op Note (Signed)
02/19/2012  9:57 AM  PATIENT:  Sheryl White  36 y.o. female  PRE-OPERATIVE DIAGNOSIS:  Dysmenorrhea Menorrhagia  POST-OPERATIVE DIAGNOSIS:  Dysmenorrhea  PROCEDURE:  Procedure(s): ROBOTIC ASSISTED TOTAL HYSTERECTOMY ROBOTIC ASSISTED Left salpingectomy and Lysis of Adhesions Cul-de -plasty   SURGEON:  Surgeon(s): Lenoard Aden, MD Genia Del, MD  ASSISTANTS: Seymour Bars   ANESTHESIA:   local and general  ESTIMATED BLOOD LOSS: * No blood loss amount entered *   DRAINS: Urinary Catheter (Foley)   LOCAL MEDICATIONS USED:  MARCAINE     SPECIMEN:  Source of Specimen:  Uterus, cervix, left tube,   DISPOSITION OF SPECIMEN:  PATHOLOGY  COUNTS:  YES  DICTATION #: [id number]  PLAN OF CARE: 23 hour observation then dc home  PATIENT DISPOSITION:  PACU - hemodynamically stable.

## 2012-02-20 NOTE — Op Note (Signed)
Sheryl White, Sheryl White               ACCOUNT NO.:  [id number]  MEDICAL RECORD NO.:  [id number]  LOCATION:  9317                          FACILITY:  [facility]  PHYSICIAN:  Lenoard Aden, M.D.DATE OF BIRTH:  [birth year]  DATE OF PROCEDURE: DATE OF DISCHARGE:  02/19/2012                              OPERATIVE REPORT   SURGEON:  Lenoard Aden, MD  DESCRIPTION OF PROCEDURE:  After being apprised of risks of anesthesia, infection, bleeding, injury to abdominal organs, possible need for repair, delayed versus immediate complications to include bowel and bladder injury, possible need for repair, the patient was brought to the operating room where she was administered general anesthetic without complications.  Prepped and draped in usual sterile fashion, and Foley catheter was placed.  RUMI retractor was placed per vagina in standard fashion.  At this time, attention was turned to the abdominal portion of the procedure whereby infraumbilical incision was made with a scalpel. Veress needle was placed with opening pressure of -1 noted, 3 liters of CO2 was insufflated without difficulty.  Trocar was placed atraumatically.  Visualization reveals adhesions to the left and right adnexa and the cul-de-sac and normal-appearing left ovary and normal anterior cul-de-sac.  At this time, the robotic ports were placed on the right and left, and the 5-mm port was also placed on the left.  The robot was docked in a standard fashion entering the PK forceps and the Endo Shears through the operative ports.  At this time, attention was turned to the ureters bilaterally were identified coming over the pelvic brim.  There were adhesions of the bowel to the right adnexa, which has been previously removed.  These were lysed using sharp dissection clearing the right adnexa and restoring the normal anatomy.  On the left side, similarly, the left sigmoid was adherent to the left tube and ovarian fossa, these were  lysed sharply restoring the normal anatomy. The left tube was traced out to the fimbriated end.  The mesosalpinx was cauterized, divided the left tube, was removed.  There was some endometriosis on the left ovary, which was also cauterized.  At this time, the round ligament was cauterized and divided and the retroperitoneal space was entered on the left.  The uterine vessels were skeletonized.  The ureter was identified.  The bladder flap was then developed sharply and the uterine vessels after being skeletonized were clamped, burned, but not cut.  On the right side after further adhesiolysis, the round ligament was divided, the retroperitoneal space was entered, the ureter was identified.  The right uterine vessels were skeletonized.  The bladder flap was developed.  The right uterine vessels were cauterized, divided, and then the uterine vessels were divided on the left.  The cervicovaginal junction was scored using the Endo Shears and the specimen was retracted into the vagina.  The vagina was closed using a 0 Vicryl V-Loc 180 days suture in a continuous running fashion and the second layer was placed performing a McCall culdoplasty in the process.  At this time, good hemostasis was noted. Irrigation was accomplished.  Ureters were seemed to be peristalsing normally bilaterally.  Urine was clear.  All instruments were then removed  after the irrigation was accomplished.  The robot was undocked and visualization revealed good hemostasis.  Vaginal exam confirmed excellent closure of the vaginal cuff.  The CO2 was then released.  Incisions were then closed using 4-0 Vicryl, 0 Vicryl and Dermabond.  The patient tolerated the procedure well, was awakened and transferred to recovery in good condition.     Lenoard Aden, M.D.     RJT/MEDQ  D:  02/19/2012  T:  02/20/2012  Job:  [id number]

## 2012-02-25 ENCOUNTER — Other Ambulatory Visit: Payer: Self-pay | Admitting: Gastroenterology

## 2012-03-28 ENCOUNTER — Encounter: Payer: Self-pay | Admitting: Gastroenterology

## 2012-03-28 ENCOUNTER — Ambulatory Visit (INDEPENDENT_AMBULATORY_CARE_PROVIDER_SITE_OTHER): Payer: 59 | Admitting: Gastroenterology

## 2012-03-28 VITALS — BP 108/72 | HR 88 | Ht 66.5 in | Wt 199.2 lb

## 2012-03-28 DIAGNOSIS — K219 Gastro-esophageal reflux disease without esophagitis: Secondary | ICD-10-CM

## 2012-03-28 MED ORDER — DEXLANSOPRAZOLE 60 MG PO CPDR: 60.0000 mg | DELAYED_RELEASE_CAPSULE | Freq: Every day | ORAL | Status: DC

## 2012-03-28 MED ORDER — RANITIDINE HCL 300 MG PO TABS: 300.0000 mg | ORAL_TABLET | Freq: Every day | ORAL | Status: DC

## 2012-03-28 NOTE — Patient Instructions (Addendum)
You have been given a separate informational sheet regarding your tobacco use, the importance of quitting and local resources to help you quit. We have sent the following medications to your pharmacy for you to pick up at your convenience: Dexilant, Ranitidine. cc: Geoffry Paradise, MD

## 2012-03-28 NOTE — Progress Notes (Signed)
History of Present Illness: This is a 36 year old female with chronic GERD. EGD in July 2009 showed esophageal erythema and a small hiatal hernia. Her symptoms had been well-controlled on Dexilant however over the past several months she notes breakthrough symptoms that occur approximately every other day at various times during the day. She does not have nocturnal symptoms. Her symptoms tend to take 2 doses of TUMS to improve and last for about one hour. Denies weight loss, abdominal pain, constipation, diarrhea, change in stool caliber, melena, hematochezia, nausea, vomiting, dysphagia, chest pain.  Current Medications, Allergies, Past Medical History, Past Surgical History, Family History and Social History were reviewed in [employer] record.  Physical Exam: General: Well developed , well nourished, no acute distress Head: Normocephalic and atraumatic Eyes:  sclerae anicteric, EOMI Ears: Normal auditory acuity Mouth: No deformity or lesions Lungs: Clear throughout to auscultation Heart: Regular rate and rhythm; no murmurs, rubs or bruits Abdomen: Soft, non tender and non distended. No masses, hepatosplenomegaly or hernias noted. Normal Bowel sounds Musculoskeletal: Symmetrical with no gross deformities  Pulses:  Normal pulses noted Extremities: No clubbing, cyanosis, edema or deformities noted Neurological: Alert oriented x 4, grossly nonfocal Psychological:  Alert and cooperative. Normal mood and affect  Assessment and Recommendations:  1. GERD. Frequent breakthrough symptoms. Add ranitidine 300 mg every evening and intensify all antireflux measures. Refill Dexilant. Consider Nexium 40 mg twice a day if this regimen is not effective. Return offices in 4 weeks

## 2012-06-01 ENCOUNTER — Ambulatory Visit: Payer: 59 | Admitting: Gastroenterology

## 2013-04-03 ENCOUNTER — Other Ambulatory Visit: Payer: Self-pay | Admitting: Gastroenterology

## 2013-04-07 ENCOUNTER — Ambulatory Visit (INDEPENDENT_AMBULATORY_CARE_PROVIDER_SITE_OTHER): Payer: 59 | Admitting: Gastroenterology

## 2013-04-07 ENCOUNTER — Encounter: Payer: Self-pay | Admitting: Gastroenterology

## 2013-04-07 VITALS — BP 110/70 | HR 78 | Ht 66.5 in | Wt 201.0 lb

## 2013-04-07 DIAGNOSIS — K219 Gastro-esophageal reflux disease without esophagitis: Secondary | ICD-10-CM

## 2013-04-07 MED ORDER — DEXLANSOPRAZOLE 60 MG PO CPDR: 60.0000 mg | DELAYED_RELEASE_CAPSULE | Freq: Every day | ORAL | Status: DC

## 2013-04-07 MED ORDER — RANITIDINE HCL 300 MG PO TABS: 300.0000 mg | ORAL_TABLET | Freq: Every day | ORAL | Status: DC

## 2013-04-07 NOTE — Patient Instructions (Addendum)
We have sent the following medications to your pharmacy for you to pick up at your convenience: Dexilant and Ranitidine with refills for 1 year. Follow up with Dr Russella Dar in 1 year. CC:  Geoffry Paradise MD

## 2013-04-07 NOTE — Progress Notes (Signed)
History of Present Illness: This is a 38 year old female returns for followup of GERD. Her reflux symptoms have come under very good control on Dexilant 60 mg in the morning and ranitidine 300 mg in the afternoon. Her only episodes of reflux now occur with dietary stressors such as spicy foods.  Current Medications, Allergies, Past Medical History, Past Surgical History, Family History and Social History were reviewed in [employer] record.  Physical Exam: General: Well developed , well nourished, no acute distress Head: Normocephalic and atraumatic Eyes:  sclerae anicteric, EOMI Ears: Normal auditory acuity Mouth: No deformity or lesions Lungs: Clear throughout to auscultation Heart: Regular rate and rhythm; no murmurs, rubs or bruits Abdomen: Soft, non tender and non distended. No masses, hepatosplenomegaly or hernias noted.  Normal Bowel sounds Musculoskeletal: Symmetrical with no gross deformities  Pulses:  Normal pulses noted Extremities: No clubbing, cyanosis, edema or deformities noted Neurological: Alert oriented x 4, grossly nonfocal Psychological:  Alert and cooperative. Normal mood and affect  Assessment and Recommendations:  1. GERD. Symptoms under very good control on current regimen. Continue current medications and all standard antireflux measures. Followup in one year and as needed.

## 2013-10-06 ENCOUNTER — Emergency Department (HOSPITAL_COMMUNITY): Payer: Medicaid Other

## 2013-10-06 ENCOUNTER — Inpatient Hospital Stay (HOSPITAL_COMMUNITY)
Admission: EM | Admit: 2013-10-06 | Discharge: 2013-10-07 | DRG: 639 | Disposition: A | Discharge status: Home / Self Care | Payer: Medicaid Other | Attending: Internal Medicine | Admitting: Internal Medicine

## 2013-10-06 ENCOUNTER — Encounter (HOSPITAL_COMMUNITY): Payer: Self-pay | Admitting: Emergency Medicine

## 2013-10-06 DIAGNOSIS — F3289 Other specified depressive episodes: Secondary | ICD-10-CM | POA: Diagnosis present

## 2013-10-06 DIAGNOSIS — K219 Gastro-esophageal reflux disease without esophagitis: Secondary | ICD-10-CM | POA: Diagnosis present

## 2013-10-06 DIAGNOSIS — F17200 Nicotine dependence, unspecified, uncomplicated: Secondary | ICD-10-CM | POA: Diagnosis present

## 2013-10-06 DIAGNOSIS — F329 Major depressive disorder, single episode, unspecified: Secondary | ICD-10-CM | POA: Diagnosis present

## 2013-10-06 DIAGNOSIS — E1010 Type 1 diabetes mellitus with ketoacidosis without coma: Principal | ICD-10-CM

## 2013-10-06 DIAGNOSIS — E1110 Type 2 diabetes mellitus with ketoacidosis without coma: Secondary | ICD-10-CM

## 2013-10-06 DIAGNOSIS — E109 Type 1 diabetes mellitus without complications: Secondary | ICD-10-CM | POA: Diagnosis present

## 2013-10-06 DIAGNOSIS — Z794 Long term (current) use of insulin: Secondary | ICD-10-CM

## 2013-10-06 DIAGNOSIS — Z79899 Other long term (current) drug therapy: Secondary | ICD-10-CM

## 2013-10-06 DIAGNOSIS — F411 Generalized anxiety disorder: Secondary | ICD-10-CM | POA: Diagnosis present

## 2013-10-06 DIAGNOSIS — E785 Hyperlipidemia, unspecified: Secondary | ICD-10-CM | POA: Diagnosis present

## 2013-10-06 LAB — COMPREHENSIVE METABOLIC PANEL
ALT: 14 U/L (ref 0–35)
AST: 18 U/L (ref 0–37)
BUN: 15 mg/dL (ref 6–23)
CO2: 12 mEq/L — ABNORMAL LOW (ref 19–32)
Calcium: 8.8 mg/dL (ref 8.4–10.5)
Chloride: 98 mEq/L (ref 96–112)
GFR calc non Af Amer: >90 mL/min (ref >90)
Potassium: 3.8 mEq/L (ref 3.5–5.1)
Sodium: 135 mEq/L (ref 135–145)
Total Bilirubin: 0.6 mg/dL (ref 0.3–1.2)

## 2013-10-06 LAB — URINE MICROSCOPIC-ADD ON

## 2013-10-06 LAB — POCT I-STAT 3, ART BLOOD GAS (G3+)
O2 Saturation: 97 %
Patient temperature: 98.6
pCO2 arterial: 22.8 mmHg — ABNORMAL LOW (ref 35.0–45.0)
pH, Arterial: 7.304 — ABNORMAL LOW (ref 7.350–7.450)
pO2, Arterial: 97 mmHg (ref 80.0–100.0)

## 2013-10-06 LAB — POCT I-STAT, CHEM 8
Calcium, Ion: 1.25 mmol/L — ABNORMAL HIGH (ref 1.12–1.23)
Chloride: 103 mEq/L (ref 96–112)
Glucose, Bld: 260 mg/dL — ABNORMAL HIGH (ref 70–99)
HCT: 45 % (ref 36.0–46.0)

## 2013-10-06 LAB — URINALYSIS, ROUTINE W REFLEX MICROSCOPIC
Glucose, UA: >1000 mg/dL — AB
Leukocytes, UA: NEGATIVE
Specific Gravity, Urine: 1.026 (ref 1.005–1.030)
pH: 5 (ref 5.0–8.0)

## 2013-10-06 MED ORDER — ONDANSETRON HCL 4 MG/2ML IJ SOLN: 4.0000 mg | Freq: Once | INTRAMUSCULAR | Status: DC

## 2013-10-06 MED ORDER — SODIUM CHLORIDE 0.9 % IV BOLUS (SEPSIS)
1000.0000 mL | Freq: Once | INTRAVENOUS | Status: AC
  Administered 2013-10-06: 1000 mL via INTRAVENOUS

## 2013-10-06 NOTE — ED Notes (Addendum)
cbg high ~ 2000 454. Took 8/9 novolog. Took another 4 units ~ 20 minutes ago.  C/o dry mouth, some sob, sick earlier from eating. Ketones are high. Has insulin pump.

## 2013-10-06 NOTE — ED Provider Notes (Signed)
CSN: [id number]     Arrival date & time 10/06/13  2244 History   First MD Initiated Contact with Patient 10/06/13 2306     Chief Complaint  Patient presents with  . Hyperglycemia   (Consider location/radiation/quality/duration/timing/severity/associated sxs/prior Treatment) HPI Hx per PT - Has IDDM and has been having trouble with her sugars all week, she c/o fatigue and tonight some SOB, N/V and persistent elevated sugars.  She feels warm but no chills or measured fevers. No ABD pain, no CP, no HA or neck pain/ stiffness. No rash, sick contacts or recent travel.  Followed by [facility]. No dysuria or back pain. No sore throat or cough.   Past Medical History  Diagnosis Date  . Endometriosis   . Ovarian cyst   . Diabetes mellitus   . Angina   . GERD (gastroesophageal reflux disease)   . Anxiety   . Depression   . Dyslipidemia    Past Surgical History  Procedure Laterality Date  . Laparoscopic salpingoopherectomy    . Endometrial ablation    . Wisdom tooth extraction    . Tubal ligation    . Carpal tunnel release      bilateral  . Colonoscopy    . Upper gastrointestinal endoscopy    . Svd      x 1  . Cesarean section      x 1  . Abdominal hysterectomy      still have left ovary  . Trigger finger release      bilateral   Family History  Problem Relation Age of Onset  . Colon polyps Father   . Diabetes Son   . Diabetes Sister   . Cancer Father     ?   History  Substance Use Topics  . Smoking status: Current Every Day Smoker -- 1.00 packs/day for 5 years    Types: Cigarettes  . Smokeless tobacco: Never Used  . Alcohol Use: 0.0 oz/week    2-3 drink(s) per week     Comment: occasionally   OB History   Grav Para Term Preterm Abortions TAB SAB Ect Mult Living   4 2 1 1 2  2   2      Review of Systems  Constitutional: Positive for fatigue. Negative for chills.  HENT: Negative for sore throat.   Eyes: Negative for visual disturbance.  Respiratory: Positive for  shortness of breath. Negative for cough.   Cardiovascular: Negative for chest pain, palpitations and leg swelling.  Gastrointestinal: Positive for nausea and vomiting. Negative for abdominal pain.  Genitourinary: Negative for dysuria.  Musculoskeletal: Negative for back pain.  Skin: Negative for rash.  Neurological: Negative for headaches.  All other systems reviewed and are negative.    Allergies  Review of patient's allergies indicates no known allergies.  Home Medications   Current Outpatient Rx  Name  Route  Sig  Dispense  Refill  . acetaminophen (TYLENOL) 500 MG tablet   Oral   Take 3,000 mg by mouth 2 (two) times daily as needed for pain.          Marland Kitchen ALPRAZolam (XANAX) 0.25 MG tablet   Oral   Take 0.25 mg by mouth 2 (two) times daily as needed for anxiety (chest pains).          Marland Kitchen dexlansoprazole (DEXILANT) 60 MG capsule   Oral   Take 1 capsule (60 mg total) by mouth daily.   30 capsule   11   . insulin glargine (LANTUS) 100 UNIT/ML  injection   Subcutaneous   Inject 45 Units into the skin at bedtime.         . insulin lispro (HUMALOG) 100 UNIT/ML injection   Subcutaneous   Inject 4-15 Units into the skin 6 (six) times daily. Per sliding scale          BP 120/73  Pulse 133  Temp(Src) 97.7 F (36.5 C) (Oral)  SpO2 100% Physical Exam  Constitutional: She is oriented to person, place, and time. She appears well-developed and well-nourished.  HENT:  Head: Normocephalic and atraumatic.  Mouth/Throat: Oropharynx is clear and moist. No oropharyngeal exudate.  Eyes: Conjunctivae and EOM are normal. Pupils are equal, round, and reactive to light.  Neck: Neck supple. No tracheal deviation present.  Cardiovascular: Regular rhythm and intact distal pulses.   tachycardia  Pulmonary/Chest: Effort normal and breath sounds normal. No respiratory distress. She exhibits no tenderness.  Abdominal: Soft. Bowel sounds are normal. She exhibits no distension. There is no  tenderness.  Musculoskeletal: Normal range of motion. She exhibits no edema and no tenderness.  Neurological: She is alert and oriented to person, place, and time. No cranial nerve deficit.  Skin: Skin is warm and dry.    ED Course  Procedures (including critical care time) Labs Review Labs Reviewed  GLUCOSE, CAPILLARY - Abnormal; Notable for the following:    Glucose-Capillary 292 (*)    All other components within normal limits  URINALYSIS, ROUTINE W REFLEX MICROSCOPIC - Abnormal; Notable for the following:    Glucose, UA >1000 (*)    Ketones, ur >80 (*)    All other components within normal limits  COMPREHENSIVE METABOLIC PANEL - Abnormal; Notable for the following:    CO2 12 (*)    Glucose, Bld 269 (*)    All other components within normal limits  POCT I-STAT, CHEM 8 - Abnormal; Notable for the following:    Glucose, Bld 260 (*)    Calcium, Ion 1.25 (*)    Hemoglobin 15.3 (*)    All other components within normal limits  POCT I-STAT 3, BLOOD GAS (G3+) - Abnormal; Notable for the following:    pH, Arterial 7.304 (*)    pCO2 arterial 22.8 (*)    Bicarbonate 11.3 (*)    Acid-base deficit 13.0 (*)    All other components within normal limits  URINE MICROSCOPIC-ADD ON  TROPONIN I  CBC WITH DIFFERENTIAL   Imaging Review Dg Chest Portable 1 View  10/06/2013   CLINICAL DATA:  Hyperglycemia.  EXAM: PORTABLE CHEST - 1 VIEW  COMPARISON:  None.  FINDINGS: The heart size and mediastinal contours are within normal limits. Both lungs are clear. The visualized skeletal structures are unremarkable.  IMPRESSION: No active disease.   Electronically Signed   By: Andreas Newport M.D.   On: 10/06/2013 23:27     Date: 10/07/2013  Rate: 100  Rhythm: normal sinus rhythm  QRS Axis: normal  Intervals: normal  ST/T Wave abnormalities: nonspecific ST changes  Conduction Disutrbances:none  Narrative Interpretation: sinus with PR depressions, baseline wander and mild diffuse STE  Old EKG  Reviewed: none available    CRITICAL CARE Performed by: Sunnie Nielsen Total critical care time: 35 Critical care time was exclusive of separately billable procedures and treating other patients. Critical care was necessary to treat or prevent imminent or life-threatening deterioration. Critical care was time spent personally by me on the following activities: development of treatment plan with patient and/or surrogate as well as nursing, discussions with consultants, evaluation  of patient's response to treatment, examination of patient, obtaining history from patient or surrogate, ordering and performing treatments and interventions, ordering and review of laboratory studies, ordering and review of radiographic studies, pulse oximetry and re-evaluation of patient's condition. IVFs, IV insulin ordered 12:47 AM d/w GMA DR Jarold Motto, will evaluate bedside for admit  MDM  Dx: DKA  Labs, ECG, CXR, UA IVFs  MED admit    Sunnie Nielsen, MD 10/07/13 [phone]

## 2013-10-07 ENCOUNTER — Encounter (HOSPITAL_COMMUNITY): Payer: Self-pay | Admitting: Internal Medicine

## 2013-10-07 DIAGNOSIS — E1010 Type 1 diabetes mellitus with ketoacidosis without coma: Secondary | ICD-10-CM

## 2013-10-07 LAB — GLUCOSE, CAPILLARY
Glucose-Capillary: 129 mg/dL — ABNORMAL HIGH (ref 70–99)
Glucose-Capillary: 141 mg/dL — ABNORMAL HIGH (ref 70–99)
Glucose-Capillary: 151 mg/dL — ABNORMAL HIGH (ref 70–99)
Glucose-Capillary: 156 mg/dL — ABNORMAL HIGH (ref 70–99)
Glucose-Capillary: 169 mg/dL — ABNORMAL HIGH (ref 70–99)
Glucose-Capillary: 177 mg/dL — ABNORMAL HIGH (ref 70–99)
Glucose-Capillary: 191 mg/dL — ABNORMAL HIGH (ref 70–99)
Glucose-Capillary: 258 mg/dL — ABNORMAL HIGH (ref 70–99)
Glucose-Capillary: 94 mg/dL (ref 70–99)

## 2013-10-07 LAB — CBC
HCT: 35.1 % — ABNORMAL LOW (ref 36.0–46.0)
Hemoglobin: 12.5 g/dL (ref 12.0–15.0)
MCH: 33.5 pg (ref 26.0–34.0)
MCHC: 35.6 g/dL (ref 30.0–36.0)
MCV: 94.1 fL (ref 78.0–100.0)
Platelets: 287 10*3/uL (ref 150–400)
RBC: 3.73 MIL/uL — ABNORMAL LOW (ref 3.87–5.11)
RDW: 12 % (ref 11.5–15.5)

## 2013-10-07 LAB — BASIC METABOLIC PANEL
BUN: 8 mg/dL (ref 6–23)
CO2: 13 mEq/L — ABNORMAL LOW (ref 19–32)
CO2: 23 mEq/L (ref 19–32)
Calcium: 7.9 mg/dL — ABNORMAL LOW (ref 8.4–10.5)
Calcium: 8.1 mg/dL — ABNORMAL LOW (ref 8.4–10.5)
Chloride: 103 mEq/L (ref 96–112)
Chloride: 102 mEq/L (ref 96–112)
Chloride: 103 mEq/L (ref 96–112)
Creatinine, Ser: 0.58 mg/dL (ref 0.50–1.10)
Creatinine, Ser: 0.62 mg/dL (ref 0.50–1.10)
GFR calc Af Amer: >90 mL/min (ref >90)
GFR calc Af Amer: >90 mL/min (ref >90)
GFR calc non Af Amer: >90 mL/min (ref >90)
GFR calc non Af Amer: >90 mL/min (ref >90)
GFR calc non Af Amer: >90 mL/min (ref >90)
Glucose, Bld: 276 mg/dL — ABNORMAL HIGH (ref 70–99)
Potassium: 3.8 mEq/L (ref 3.5–5.1)
Potassium: 3.9 mEq/L (ref 3.5–5.1)
Sodium: 136 mEq/L (ref 135–145)
Sodium: 134 mEq/L — ABNORMAL LOW (ref 135–145)
Sodium: 135 mEq/L (ref 135–145)

## 2013-10-07 MED ORDER — POTASSIUM CHLORIDE CRYS ER 20 MEQ PO TBCR: 20.0000 meq | EXTENDED_RELEASE_TABLET | Freq: Three times a day (TID) | ORAL | Status: DC | PRN

## 2013-10-07 MED ORDER — ACETAMINOPHEN 325 MG PO TABS
650.0000 mg | ORAL_TABLET | Freq: Four times a day (QID) | ORAL | Status: DC | PRN
  Administered 2013-10-07: 650 mg via ORAL
  Filled 2013-10-07: qty 2

## 2013-10-07 MED ORDER — ONDANSETRON HCL 4 MG PO TABS: 4.0000 mg | ORAL_TABLET | Freq: Four times a day (QID) | ORAL | Status: DC | PRN

## 2013-10-07 MED ORDER — ALPRAZOLAM 0.25 MG PO TABS: 0.2500 mg | ORAL_TABLET | Freq: Two times a day (BID) | ORAL | Status: DC | PRN

## 2013-10-07 MED ORDER — INSULIN REGULAR HUMAN 100 UNIT/ML IJ SOLN: INTRAMUSCULAR | Status: DC

## 2013-10-07 MED ORDER — INSULIN GLARGINE 100 UNIT/ML ~~LOC~~ SOLN
20.0000 [IU] | Freq: Once | SUBCUTANEOUS | Status: AC
  Administered 2013-10-07: 20 [IU] via SUBCUTANEOUS
  Filled 2013-10-07: qty 0.2

## 2013-10-07 MED ORDER — SODIUM CHLORIDE 0.9 % IV SOLN
INTRAVENOUS | Status: DC
  Administered 2013-10-07: 2.2 [IU]/h via INTRAVENOUS
  Filled 2013-10-07: qty 1

## 2013-10-07 MED ORDER — INSULIN ASPART 100 UNIT/ML ~~LOC~~ SOLN
0.0000 [IU] | Freq: Three times a day (TID) | SUBCUTANEOUS | Status: DC
  Administered 2013-10-07: 3 [IU] via SUBCUTANEOUS

## 2013-10-07 MED ORDER — SODIUM CHLORIDE 0.9 % IV SOLN
INTRAVENOUS | Status: DC
  Administered 2013-10-07: 02:00:00 via INTRAVENOUS

## 2013-10-07 MED ORDER — INSULIN REGULAR BOLUS VIA INFUSION
0.0000 [IU] | Freq: Three times a day (TID) | INTRAVENOUS | Status: DC
  Administered 2013-10-07: 2.9 [IU] via INTRAVENOUS
  Administered 2013-10-07: 6.3 [IU] via INTRAVENOUS
  Filled 2013-10-07: qty 10

## 2013-10-07 MED ORDER — INSULIN ASPART 100 UNIT/ML ~~LOC~~ SOLN: 0.0000 [IU] | Freq: Every day | SUBCUTANEOUS | Status: DC

## 2013-10-07 MED ORDER — DEXTROSE-NACL 5-0.45 % IV SOLN: INTRAVENOUS | Status: DC

## 2013-10-07 MED ORDER — INSULIN LISPRO 100 UNIT/ML ~~LOC~~ SOLN: 4.0000 [IU] | Freq: Every day | SUBCUTANEOUS | Status: DC

## 2013-10-07 MED ORDER — SODIUM CHLORIDE 0.9 % IV SOLN
INTRAVENOUS | Status: DC
  Filled 2013-10-07: qty 1

## 2013-10-07 MED ORDER — POTASSIUM CHLORIDE 20 MEQ PO PACK
20.0000 meq | PACK | Freq: Three times a day (TID) | ORAL | Status: DC | PRN
  Filled 2013-10-07: qty 1

## 2013-10-07 MED ORDER — ACETAMINOPHEN 650 MG RE SUPP: 650.0000 mg | Freq: Four times a day (QID) | RECTAL | Status: DC | PRN

## 2013-10-07 MED ORDER — SODIUM CHLORIDE 0.9 % IV SOLN: INTRAVENOUS | Status: DC

## 2013-10-07 MED ORDER — PANTOPRAZOLE SODIUM 40 MG PO TBEC
40.0000 mg | DELAYED_RELEASE_TABLET | Freq: Every day | ORAL | Status: DC
  Administered 2013-10-07: 40 mg via ORAL
  Filled 2013-10-07: qty 1

## 2013-10-07 MED ORDER — ACETAMINOPHEN 500 MG PO TABS: 500.0000 mg | ORAL_TABLET | Freq: Four times a day (QID) | ORAL | Status: DC | PRN

## 2013-10-07 MED ORDER — KCL IN DEXTROSE-NACL 40-5-0.45 MEQ/L-%-% IV SOLN
INTRAVENOUS | Status: DC
  Administered 2013-10-07: 1000 mL via INTRAVENOUS
  Administered 2013-10-07: 13:00:00 via INTRAVENOUS
  Filled 2013-10-07 (×4): qty 1000

## 2013-10-07 MED ORDER — INSULIN REGULAR BOLUS VIA INFUSION
0.0000 [IU] | Freq: Three times a day (TID) | INTRAVENOUS | Status: DC
  Filled 2013-10-07: qty 10

## 2013-10-07 MED ORDER — ENOXAPARIN SODIUM 40 MG/0.4ML ~~LOC~~ SOLN
40.0000 mg | SUBCUTANEOUS | Status: DC
  Filled 2013-10-07: qty 0.4

## 2013-10-07 MED ORDER — SODIUM CHLORIDE 0.9 % IV SOLN
INTRAVENOUS | Status: DC
  Administered 2013-10-07: 1 [IU]/h via INTRAVENOUS
  Filled 2013-10-07: qty 1

## 2013-10-07 MED ORDER — INSULIN NPH (HUMAN) (ISOPHANE) 100 UNIT/ML ~~LOC~~ SUSP
20.0000 [IU] | Freq: Two times a day (BID) | SUBCUTANEOUS | Status: DC
Start: 2013-10-07 — End: 2013-10-28

## 2013-10-07 MED ORDER — SENNOSIDES-DOCUSATE SODIUM 8.6-50 MG PO TABS: 1.0000 | ORAL_TABLET | Freq: Every evening | ORAL | Status: DC | PRN

## 2013-10-07 MED ORDER — ONDANSETRON HCL 4 MG/2ML IJ SOLN: 4.0000 mg | Freq: Four times a day (QID) | INTRAMUSCULAR | Status: DC | PRN

## 2013-10-07 MED ORDER — DEXTROSE 50 % IV SOLN: 25.0000 mL | INTRAVENOUS | Status: DC | PRN

## 2013-10-07 MED ORDER — INSULIN GLARGINE 100 UNIT/ML ~~LOC~~ SOLN: 20.0000 [IU] | Freq: Two times a day (BID) | SUBCUTANEOUS | Status: DC

## 2013-10-07 MED ORDER — INSULIN GLARGINE 100 UNIT/ML ~~LOC~~ SOLN
20.0000 [IU] | Freq: Once | SUBCUTANEOUS | Status: DC
  Filled 2013-10-07: qty 0.2

## 2013-10-07 NOTE — ED Notes (Signed)
DR. Jarold Motto NOTIFIED ON PT.'S CBG = 156 .

## 2013-10-07 NOTE — Progress Notes (Signed)
Subjective: Today she is without nausea or dyspnea. She recently lost her job and her health insurance and has applied for Medicaid.  Objective: Vital signs in last 24 hours: Temp:  [97.7 F (36.5 C)-97.9 F (36.6 C)] 97.9 F (36.6 C) (10/25 0715) Pulse Rate:  [84-133] 87 (10/25 0306) Resp:  [14-16] 16 (10/25 0306) BP: (97-120)/(57-81) 107/61 mmHg (10/25 0715) SpO2:  [97 %-100 %] 99 % (10/25 0306) Weight:  [76 kg (167 lb 8.8 oz)] 76 kg (167 lb 8.8 oz) (10/25 0340) Weight change:    Intake/Output from previous day: 10/24 0701 - 10/25 0700 In: 1316.7 [I.V.:1316.7] Out: -    General appearance: alert, cooperative and no distress Resp: clear to auscultation bilaterally Cardio: regular rate and rhythm Neurologic: Grossly normal  Lab Results:  Recent Labs  10/06/13 2320 10/07/13 0506  WBC  --  7.7  HGB 15.3* 12.5  HCT 45.0 35.1*  PLT  --  287   BMET  Recent Labs  10/06/13 2316 10/06/13 2320 10/07/13 0506  NA 135 136 136  K 3.8 3.8 3.8  CL 98 103 103  CO2 12*  --  13*  GLUCOSE 269* 260* 143*  BUN 15 14 10   CREATININE 0.79 1.00 0.58  CALCIUM 8.8  --  7.9*   CMET CMP     Component Value Date/Time   NA 136 10/07/2013 0506   K 3.8 10/07/2013 0506   CL 103 10/07/2013 0506   CO2 13* 10/07/2013 0506   GLUCOSE 143* 10/07/2013 0506   BUN 10 10/07/2013 0506   CREATININE 0.58 10/07/2013 0506   CALCIUM 7.9* 10/07/2013 0506   PROT 6.8 10/06/2013 2316   ALBUMIN 3.8 10/06/2013 2316   AST 18 10/06/2013 2316   ALT 14 10/06/2013 2316   ALKPHOS 85 10/06/2013 2316   BILITOT 0.6 10/06/2013 2316   GFRNONAA >90 10/07/2013 0506   GFRAA >90 10/07/2013 0506    CBG (last 3)   Recent Labs  10/07/13 0259 10/07/13 0335 10/07/13 0755  GLUCAP 129* 151* 177*    INR RESULTS:   No results found for this basename: INR, PROTIME     Studies/Results: Dg Chest Portable 1 View  10/06/2013   CLINICAL DATA:  Hyperglycemia.  EXAM: PORTABLE CHEST - 1 VIEW  COMPARISON:   None.  FINDINGS: The heart size and mediastinal contours are within normal limits. Both lungs are clear. The visualized skeletal structures are unremarkable.  IMPRESSION: No active disease.   Electronically Signed   By: Andreas Newport M.D.   On: 10/06/2013 23:27    Medications: I have reviewed the patient's current medications.  Assessment/Plan: #1 DKA: stable, not sure why IV insulin was discontinued as I had no given a verbal order for that. She still has an anion gap, so will have her get IV insulin with IV glucose, and when she no longer has an anion gap she will be ready for discharge to home. Due to cost concerns at home we will switch to [employer] brand Relion NPH and Relion regular insulin upon discharge   LOS: 1 day   Haydin Calandra G 10/07/2013, 10:51 AM

## 2013-10-07 NOTE — Discharge Summary (Signed)
Physician Discharge Summary  Patient ID: Sheryl White MRN: [id number] DOB/AGE: 1976-05-13 37 y.o.  Admit date: 10/06/2013 Discharge date: 10/07/2013   Discharge Diagnoses:  Principal Problem:   Diabetic ketoacidosis, type I Active Problems:   DIABETES MELLITUS, TYPE I   GERD   Discharged Condition: good  Hospital Course:   Patient is a 37 year old Caucasian woman with several medical problems. She has diabetes mellitus, type I for which he takes a long-acting insulin and short acting insulin on a regular basis. She had been on an insulin pump which he discontinued several months ago out of frustration with the infusion sets not apparently working well. She was in her usual state of good health until once 2 days ago when she began to have mild nausea. Her symptoms persisted despite continuing to take insulin. She had some vomiting of greenish fluid earlier today. She presented to the emergency room for evaluation and was noted to be in diabetic ketoacidosis. She was given IV fluids and IV insulin as well as an antibiotic. Her symptoms have improved significantly and currently she is hungry. She states that she has not had any break in her insulin treatment. She did have a moderate amount of alcohol yesterday. She has not had any fever, chills, productive cough, dysuria, frequency, abdominal pain, or headache.  She was treated with high dose insulin, IV fluids, and potassium replacement and did well with rapid resolution of her sxs and normalization of her acidosis. By the time of discharge she was without nausea and eating and drinking well. There were no procedures done and no complications.  Consults: None  Significant Diagnostic Studies:  Dg Chest Portable 1 View  10/06/2013   CLINICAL DATA:  Hyperglycemia.  EXAM: PORTABLE CHEST - 1 VIEW  COMPARISON:  None.  FINDINGS: The heart size and mediastinal contours are within normal limits. Both lungs are clear. The visualized skeletal  structures are unremarkable.  IMPRESSION: No active disease.   Electronically Signed   By: Andreas Newport M.D.   On: 10/06/2013 23:27    Labs: Lab Results  Component Value Date   WBC 7.7 10/07/2013   HGB 12.5 10/07/2013   HCT 35.1* 10/07/2013   MCV 94.1 10/07/2013   PLT 287 10/07/2013     Recent Labs Lab 10/06/13 2316  10/07/13 1547  NA 135  < > 135  K 3.8  < > 3.9  CL 98  < > 103  CO2 12*  < > 23  BUN 15  < > 9  CREATININE 0.79  < > 0.62  CALCIUM 8.8  < > 8.7  PROT 6.8  --   --   BILITOT 0.6  --   --   ALKPHOS 85  --   --   ALT 14  --   --   AST 18  --   --   GLUCOSE 269*  < > 134*  < > = values in this interval not displayed.     No results found for this basename: INR, PROTIME     Recent Results (from the past 240 hour(s))  MRSA PCR SCREENING     Status: None   Collection Time    10/07/13  3:37 AM      Result Value Range Status   MRSA by PCR NEGATIVE  NEGATIVE Final   Comment:            The GeneXpert MRSA Assay (FDA     approved for NASAL specimens  only), is one component of a     comprehensive MRSA colonization     surveillance program. It is not     intended to diagnose MRSA     infection nor to guide or     monitor treatment for     MRSA infections.      Discharge Exam: Blood pressure 100/64, pulse 87, temperature 98.3 F (36.8 C), temperature source Oral, resp. rate 21, height 5\' 6"  (1.676 m), weight 76 kg (167 lb 8.8 oz), SpO2 99.00%.  Physical Exam: wwell-nourished well-developed white woman who was in no apparent distress.  HEENT exam was within normal limits, neck was supple without jugular venous distention, chest was clear to auscultation, heart had a regular rate and rhythm and was without significant murmur or gallop, abdomen had normal bowel sounds and no hepatosplenomegaly or tenderness, extremities were without cyanosis, clubbing, or edema, neurological exam was nonfocal  Disposition:  She'll be discharged to home this evening.  We  will have her use of her existing Lantus insulin and Humalog insulin and then she will switch to [employer] brand Relion NPH and Regular insulin as described below.  This evening I will call in to [facility] the prescriptions for the new insulin types.   She should call our office if she has any problems in the interim.  Discharge Orders   Future Orders Complete By Expires   Call MD for:  As directed    Comments:     Call if you develop nausea, vomiting, difficulty breathing, or sugars consistently over 300 or less than 70.   Diet - low sodium heart healthy  As directed    Discharge instructions  As directed    Comments:     Tonight you will get lantus 20 units at the hospital, take an additional 20 units tomorrow am. I will call your Relion NPH insulin and Relion Regular insulin into [employer] which you can start after you run out of your current supply of insulin   Increase activity slowly  As directed        Medication List         acetaminophen 500 MG tablet  Commonly known as:  TYLENOL  Take 1 tablet (500 mg total) by mouth every 6 (six) hours as needed for pain.     ALPRAZolam 0.25 MG tablet  Commonly known as:  XANAX  Take 0.25 mg by mouth 2 (two) times daily as needed for anxiety (chest pains).     dexlansoprazole 60 MG capsule  Commonly known as:  DEXILANT  Take 1 capsule (60 mg total) by mouth daily.     insulin glargine 100 UNIT/ML injection  Commonly known as:  LANTUS  Inject 0.2 mLs (20 Units total) into the skin 2 (two) times daily.     insulin lispro 100 UNIT/ML injection  Commonly known as:  HUMALOG  Inject 4-15 Units into the skin 6 (six) times daily. Per sliding scale     insulin NPH 100 UNIT/ML injection  Commonly known as:  NOVOLIN N  Inject 20 Units into the skin 2 (two) times daily.     insulin regular 100 units/mL injection  Commonly known as:  HUMULIN R  Use the sliding scale mealtime insulin that you are used to           Follow-up Information    Follow up with ARONSON,RICHARD A, MD. Schedule an appointment as soon as possible for a visit in 1 week.   Specialty:  Internal  Medicine   Contact information:   [address] [facility] [place] [address] [phone]       Signed: Garlan Fillers 10/07/2013, 6:07 PM

## 2013-10-07 NOTE — Progress Notes (Signed)
Discharge instructions per MD given to patient and spouse.  Patient discharged to home.

## 2013-10-07 NOTE — H&P (Signed)
Sheryl White is an 37 y.o. female.   Chief Complaint: nausea and vomiting HPI:  Patient is a 37 year old Caucasian woman with several medical problems. She has diabetes mellitus, type I for which he takes a long-acting insulin and short acting insulin on a regular basis. She had been on an insulin pump which he discontinued several months ago out of frustration with the infusion sets not apparently working well. She was in her usual state of good health until once 2 days ago when she began to have mild nausea. Her symptoms persisted despite continuing to take insulin. She had some vomiting of greenish fluid earlier today. She presented to the emergency room for evaluation and was noted to be in diabetic ketoacidosis. She was given IV fluids and IV insulin as well as an antibiotic. Her symptoms have improved significantly and currently she is hungry. She states that she has not had any break in her insulin treatment. She did have a moderate amount of alcohol yesterday. She has not had any fever, chills, productive cough, dysuria, frequency, abdominal pain, or headache. Past Medical History  Diagnosis Date  . Endometriosis   . Ovarian cyst   . Diabetes mellitus   . Angina   . GERD (gastroesophageal reflux disease)   . Anxiety   . Depression   . Dyslipidemia      (Not in a hospital admission)  ADDITIONAL HOME MEDICATIONS:  No additional medications PHYSICIANS INVOLVED IN CARE: Geoffry Paradise (primary care), Claudette Head (GI), Olivia Mackie (gynecology)  Past Surgical History  Procedure Laterality Date  . Laparoscopic salpingoopherectomy    . Endometrial ablation    . Wisdom tooth extraction    . Tubal ligation    . Carpal tunnel release      bilateral  . Colonoscopy    . Upper gastrointestinal endoscopy    . Svd      x 1  . Cesarean section      x 1  . Abdominal hysterectomy      still have left ovary  . Trigger finger release      bilateral    Family History  Problem  Relation Age of Onset  . Colon polyps Father   . Diabetes Son   . Diabetes Sister   . Cancer Father     ?     Social History:  reports that she has been smoking Cigarettes.  She has a 5 pack-year smoking history. She has never used smokeless tobacco. She reports that she drinks alcohol. She reports that she does not use illicit drugs.  Allergies: No Known Allergies   ROS: diabetes, tobacco use and Gastroesophageal reflux  PHYSICAL EXAM: Blood pressure 100/60, pulse 88, temperature 97.7 F (36.5 C), temperature source Oral, resp. rate 16, SpO2 98.00%.  in general, patient is a well-nourished well-developed white woman who was in no apparent distress while lying supine in bed. HEENT exam was within normal limits, neck was supple without jugular venous distention, chest was clear to auscultation, heart had a regular rate and rhythm and was without significant murmur or gallop, abdomen had normal bowel sounds and no hepatosplenomegaly or tenderness, extremities were without cyanosis, clubbing, or edema. Neurological exam was nonfocal.  Results for orders placed during the hospital encounter of 10/06/13 (from the past 48 hour(s))  GLUCOSE, CAPILLARY     Status: Abnormal   Collection Time    10/06/13 10:57 PM      Result Value Range   Glucose-Capillary 292 (*) 70 -  99 mg/dL  URINALYSIS, ROUTINE W REFLEX MICROSCOPIC     Status: Abnormal   Collection Time    10/06/13 11:04 PM      Result Value Range   Color, Urine YELLOW  YELLOW   APPearance CLEAR  CLEAR   Specific Gravity, Urine 1.026  1.005 - 1.030   pH 5.0  5.0 - 8.0   Glucose, UA >1000 (*) NEGATIVE mg/dL   Hgb urine dipstick NEGATIVE  NEGATIVE   Bilirubin Urine NEGATIVE  NEGATIVE   Ketones, ur >80 (*) NEGATIVE mg/dL   Protein, ur NEGATIVE  NEGATIVE mg/dL   Urobilinogen, UA 0.2  0.0 - 1.0 mg/dL   Nitrite NEGATIVE  NEGATIVE   Leukocytes, UA NEGATIVE  NEGATIVE  URINE MICROSCOPIC-ADD ON     Status: None   Collection Time     10/06/13 11:04 PM      Result Value Range   Squamous Epithelial / LPF RARE  RARE   WBC, UA 0-2  <3 WBC/hpf   RBC / HPF 0-2  <3 RBC/hpf   Bacteria, UA RARE  RARE  COMPREHENSIVE METABOLIC PANEL     Status: Abnormal   Collection Time    10/06/13 11:16 PM      Result Value Range   Sodium 135  135 - 145 mEq/L   Potassium 3.8  3.5 - 5.1 mEq/L   Chloride 98  96 - 112 mEq/L   CO2 12 (*) 19 - 32 mEq/L   Glucose, Bld 269 (*) 70 - 99 mg/dL   BUN 15  6 - 23 mg/dL   Creatinine, Ser 1.61  0.50 - 1.10 mg/dL   Calcium 8.8  8.4 - 09.6 mg/dL   Total Protein 6.8  6.0 - 8.3 g/dL   Albumin 3.8  3.5 - 5.2 g/dL   AST 18  0 - 37 U/L   ALT 14  0 - 35 U/L   Alkaline Phosphatase 85  39 - 117 U/L   Total Bilirubin 0.6  0.3 - 1.2 mg/dL   GFR calc non Af Amer >90  >90 mL/min   GFR calc Af Amer >90  >90 mL/min   Comment: (NOTE)     The eGFR has been calculated using the CKD EPI equation.     This calculation has not been validated in all clinical situations.     eGFR's persistently <90 mL/min signify possible Chronic Kidney     Disease.  TROPONIN I     Status: None   Collection Time    10/06/13 11:16 PM      Result Value Range   Troponin I <0.30  <0.30 ng/mL   Comment:            Due to the release kinetics of cTnI,     a negative result within the first hours     of the onset of symptoms does not rule out     myocardial infarction with certainty.     If myocardial infarction is still suspected,     repeat the test at appropriate intervals.  POCT I-STAT, CHEM 8     Status: Abnormal   Collection Time    10/06/13 11:20 PM      Result Value Range   Sodium 136  135 - 145 mEq/L   Potassium 3.8  3.5 - 5.1 mEq/L   Chloride 103  96 - 112 mEq/L   BUN 14  6 - 23 mg/dL   Creatinine, Ser 0.45  0.50 - 1.10 mg/dL  Glucose, Bld 260 (*) 70 - 99 mg/dL   Calcium, Ion 5.40 (*) 1.12 - 1.23 mmol/L   TCO2 12  0 - 100 mmol/L   Hemoglobin 15.3 (*) 12.0 - 15.0 g/dL   HCT 98.1  19.1 - 47.8 %  POCT I-STAT 3, BLOOD  GAS (G3+)     Status: Abnormal   Collection Time    10/06/13 11:56 PM      Result Value Range   pH, Arterial 7.304 (*) 7.350 - 7.450   pCO2 arterial 22.8 (*) 35.0 - 45.0 mmHg   pO2, Arterial 97.0  80.0 - 100.0 mmHg   Bicarbonate 11.3 (*) 20.0 - 24.0 mEq/L   TCO2 12  0 - 100 mmol/L   O2 Saturation 97.0     Acid-base deficit 13.0 (*) 0.0 - 2.0 mmol/L   Patient temperature 98.6 F     Collection site RADIAL, ALLEN'S TEST ACCEPTABLE     Drawn by RT     Sample type ARTERIAL    GLUCOSE, CAPILLARY     Status: Abnormal   Collection Time    10/07/13 12:46 AM      Result Value Range   Glucose-Capillary 169 (*) 70 - 99 mg/dL  GLUCOSE, CAPILLARY     Status: Abnormal   Collection Time    10/07/13  1:57 AM      Result Value Range   Glucose-Capillary 156 (*) 70 - 99 mg/dL   Dg Chest Portable 1 View  10/06/2013   CLINICAL DATA:  Hyperglycemia.  EXAM: PORTABLE CHEST - 1 VIEW  COMPARISON:  None.  FINDINGS: The heart size and mediastinal contours are within normal limits. Both lungs are clear. The visualized skeletal structures are unremarkable.  IMPRESSION: No active disease.   Electronically Signed   By: Andreas Newport M.D.   On: 10/06/2013 23:27     Assessment/Plan  #1 diabetic ketoacidosis: She had moderate ketoacidosis and clinically is doing a lot better. He is hardly that she has been regularly taking her insulin as she does not have other acute illness to explain her ketoacidosis. Nevertheless, we will treat her with high-dose IV fluids, IV insulin, and antiemetics as needed. #2 gastroesophageal reflux disease: Stable on a proton pump inhibitor which will be continued.  Tahj Njoku G 10/07/2013, 2:08 AM

## 2013-10-28 ENCOUNTER — Encounter (HOSPITAL_COMMUNITY): Payer: Self-pay | Admitting: Emergency Medicine

## 2013-10-28 ENCOUNTER — Inpatient Hospital Stay (HOSPITAL_COMMUNITY)
Admission: EM | Admit: 2013-10-28 | Discharge: 2013-10-30 | DRG: 639 | Disposition: A | Discharge status: Home / Self Care | Payer: Medicaid Other | Attending: Internal Medicine | Admitting: Internal Medicine

## 2013-10-28 ENCOUNTER — Emergency Department (HOSPITAL_COMMUNITY): Payer: Medicaid Other

## 2013-10-28 DIAGNOSIS — Z794 Long term (current) use of insulin: Secondary | ICD-10-CM

## 2013-10-28 DIAGNOSIS — K219 Gastro-esophageal reflux disease without esophagitis: Secondary | ICD-10-CM | POA: Diagnosis present

## 2013-10-28 DIAGNOSIS — F3289 Other specified depressive episodes: Secondary | ICD-10-CM | POA: Diagnosis present

## 2013-10-28 DIAGNOSIS — F329 Major depressive disorder, single episode, unspecified: Secondary | ICD-10-CM | POA: Diagnosis present

## 2013-10-28 DIAGNOSIS — Z833 Family history of diabetes mellitus: Secondary | ICD-10-CM

## 2013-10-28 DIAGNOSIS — Z8371 Family history of colonic polyps: Secondary | ICD-10-CM

## 2013-10-28 DIAGNOSIS — Z83719 Family history of colon polyps, unspecified: Secondary | ICD-10-CM

## 2013-10-28 DIAGNOSIS — F411 Generalized anxiety disorder: Secondary | ICD-10-CM | POA: Diagnosis present

## 2013-10-28 DIAGNOSIS — E785 Hyperlipidemia, unspecified: Secondary | ICD-10-CM | POA: Diagnosis present

## 2013-10-28 DIAGNOSIS — F17200 Nicotine dependence, unspecified, uncomplicated: Secondary | ICD-10-CM | POA: Diagnosis present

## 2013-10-28 DIAGNOSIS — E860 Dehydration: Secondary | ICD-10-CM | POA: Diagnosis present

## 2013-10-28 DIAGNOSIS — E1110 Type 2 diabetes mellitus with ketoacidosis without coma: Secondary | ICD-10-CM

## 2013-10-28 DIAGNOSIS — E1010 Type 1 diabetes mellitus with ketoacidosis without coma: Principal | ICD-10-CM | POA: Diagnosis present

## 2013-10-28 DIAGNOSIS — A088 Other specified intestinal infections: Secondary | ICD-10-CM | POA: Diagnosis present

## 2013-10-28 LAB — COMPREHENSIVE METABOLIC PANEL
ALT: 13 U/L (ref 0–35)
AST: 15 U/L (ref 0–37)
BUN: 11 mg/dL (ref 6–23)
Calcium: 9.9 mg/dL (ref 8.4–10.5)
Creatinine, Ser: 0.72 mg/dL (ref 0.50–1.10)
GFR calc Af Amer: >90 mL/min (ref >90)
Sodium: 137 mEq/L (ref 135–145)
Total Bilirubin: 0.7 mg/dL (ref 0.3–1.2)
Total Protein: 7.9 g/dL (ref 6.0–8.3)

## 2013-10-28 LAB — CBC WITH DIFFERENTIAL/PLATELET
Basophils Absolute: 0 10*3/uL (ref 0.0–0.1)
Eosinophils Absolute: 0 10*3/uL (ref 0.0–0.7)
Eosinophils Relative: 0 % (ref 0–5)
MCH: 33.6 pg (ref 26.0–34.0)
MCHC: 35.6 g/dL (ref 30.0–36.0)
MCV: 94.3 fL (ref 78.0–100.0)
Platelets: 453 10*3/uL — ABNORMAL HIGH (ref 150–400)
RDW: 12 % (ref 11.5–15.5)

## 2013-10-28 LAB — POCT I-STAT 3, ART BLOOD GAS (G3+)
Acid-base deficit: 11 mmol/L — ABNORMAL HIGH (ref 0.0–2.0)
Patient temperature: 98.6
pO2, Arterial: 99 mmHg (ref 80.0–100.0)

## 2013-10-28 LAB — GLUCOSE, CAPILLARY: Glucose-Capillary: 334 mg/dL — ABNORMAL HIGH (ref 70–99)

## 2013-10-28 MED ORDER — SODIUM CHLORIDE 0.9 % IV BOLUS (SEPSIS)
1000.0000 mL | Freq: Once | INTRAVENOUS | Status: AC
  Administered 2013-10-28: 1000 mL via INTRAVENOUS

## 2013-10-28 MED ORDER — SODIUM CHLORIDE 0.9 % IV BOLUS (SEPSIS)
1000.0000 mL | Freq: Once | INTRAVENOUS | Status: AC
  Administered 2013-10-29: 1000 mL via INTRAVENOUS

## 2013-10-28 MED ORDER — ONDANSETRON HCL 4 MG/2ML IJ SOLN
4.0000 mg | Freq: Once | INTRAMUSCULAR | Status: AC
  Administered 2013-10-28: 4 mg via INTRAVENOUS

## 2013-10-28 MED ORDER — INSULIN REGULAR BOLUS VIA INFUSION
0.0000 [IU] | Freq: Three times a day (TID) | INTRAVENOUS | Status: DC
  Administered 2013-10-29: 5.1 [IU] via INTRAVENOUS
  Filled 2013-10-28: qty 10

## 2013-10-28 MED ORDER — ONDANSETRON HCL 4 MG/2ML IJ SOLN
INTRAMUSCULAR | Status: AC
  Filled 2013-10-28: qty 2

## 2013-10-28 MED ORDER — SODIUM CHLORIDE 0.9 % IV SOLN
INTRAVENOUS | Status: DC
  Administered 2013-10-29: 4.3 [IU]/h via INTRAVENOUS
  Administered 2013-10-29: 1.3 [IU]/h via INTRAVENOUS
  Administered 2013-10-29: 2.1 [IU]/h via INTRAVENOUS
  Administered 2013-10-29: 08:00:00 via INTRAVENOUS
  Filled 2013-10-28: qty 1

## 2013-10-28 MED ORDER — SODIUM CHLORIDE 0.9 % IV SOLN: INTRAVENOUS | Status: DC

## 2013-10-28 MED ORDER — DEXTROSE 50 % IV SOLN: 25.0000 mL | INTRAVENOUS | Status: DC | PRN

## 2013-10-28 MED ORDER — DEXTROSE-NACL 5-0.45 % IV SOLN
INTRAVENOUS | Status: DC
  Administered 2013-10-29: 01:00:00 via INTRAVENOUS

## 2013-10-28 NOTE — ED Notes (Signed)
CBG is 334. Notified Nurse Shary Key.

## 2013-10-28 NOTE — ED Notes (Signed)
Pt states vomiting all day. Pt states that she is a type 1 diabetic and she was taking her CBG at 21:30 was 330. Pt was given 8 units of insulin per her pump. Pt states generalized body aches and pains.

## 2013-10-28 NOTE — ED Provider Notes (Signed)
CSN: [id number]     Arrival date & time 10/28/13  2223 History   First MD Initiated Contact with Patient 10/28/13 2319     Chief Complaint  Patient presents with  . Hyperglycemia   (Consider location/radiation/quality/duration/timing/severity/associated sxs/prior Treatment) HPI Hx per PT - Has IDDM and has been having trouble with her sugars since yesterday, she c/o fatigue and today N/V multiple episodes despite home insulin.No Chills/ fevers. No ABD pain, no CP, no HA or neck pain/ stiffness. No rash, sick contacts or recent travel. Followed by [facility]. No dysuria or back pain. No sore throat or cough. Symptoms MOD in severity, states she is under a lot of stress at home and that is what she attributes this episode to.    Past Medical History  Diagnosis Date  . Endometriosis   . Ovarian cyst   . Diabetes mellitus   . Angina   . GERD (gastroesophageal reflux disease)   . Anxiety   . Depression   . Dyslipidemia    Past Surgical History  Procedure Laterality Date  . Laparoscopic salpingoopherectomy    . Endometrial ablation    . Wisdom tooth extraction    . Tubal ligation    . Carpal tunnel release      bilateral  . Colonoscopy    . Upper gastrointestinal endoscopy    . Svd      x 1  . Cesarean section      x 1  . Abdominal hysterectomy      still have left ovary  . Trigger finger release      bilateral   Family History  Problem Relation Age of Onset  . Colon polyps Father   . Diabetes Son   . Diabetes Sister   . Cancer Father     ?   History  Substance Use Topics  . Smoking status: Current Every Day Smoker -- 1.00 packs/day for 5 years    Types: Cigarettes  . Smokeless tobacco: Never Used  . Alcohol Use: 0.0 oz/week    2-3 drink(s) per week     Comment: occasionally   OB History   Grav Para Term Preterm Abortions TAB SAB Ect Mult Living   4 2 1 1 2  2   2      Review of Systems  Constitutional: Negative for fever and chills.  HENT: Negative for sore  throat and trouble swallowing.   Eyes: Negative for visual disturbance.  Respiratory: Negative for shortness of breath.   Cardiovascular: Negative for chest pain.  Gastrointestinal: Positive for nausea and vomiting. Negative for abdominal pain.  Genitourinary: Negative for dysuria.  Musculoskeletal: Negative for back pain, neck pain and neck stiffness.  Skin: Negative for rash.  Neurological: Negative for headaches.  All other systems reviewed and are negative.    Allergies  Review of patient's allergies indicates no known allergies.  Home Medications   Current Outpatient Rx  Name  Route  Sig  Dispense  Refill  . acetaminophen (TYLENOL) 500 MG tablet   Oral   Take 1 tablet (500 mg total) by mouth every 6 (six) hours as needed for pain.   30 tablet   0   . ALPRAZolam (XANAX) 0.25 MG tablet   Oral   Take 0.25 mg by mouth 2 (two) times daily as needed for anxiety (chest pains).          Marland Kitchen dexlansoprazole (DEXILANT) 60 MG capsule   Oral   Take 1 capsule (60 mg total) by  mouth daily.   30 capsule   11   . insulin glargine (LANTUS) 100 UNIT/ML injection   Subcutaneous   Inject 0.2 mLs (20 Units total) into the skin 2 (two) times daily.   10 mL   12     Give 20 units at bedtime and in the morning, until ...   . insulin lispro (HUMALOG) 100 UNIT/ML injection   Subcutaneous   Inject 4-15 Units into the skin 6 (six) times daily. Per sliding scale   10 mL   12     Use this rapid acting insulin until you run out, t ...   . omeprazole (PRILOSEC) 20 MG capsule   Oral   Take 20 mg by mouth daily.          BP 104/77  Pulse 143  Temp(Src) 97.4 F (36.3 C) (Oral)  Resp 22  SpO2 100% Physical Exam  Constitutional: She is oriented to person, place, and time. She appears well-developed and well-nourished.  HENT:  Head: Normocephalic and atraumatic.  Dry mm  Eyes: EOM are normal. Pupils are equal, round, and reactive to light. No scleral icterus.  Neck: Neck supple.   Cardiovascular: Regular rhythm and intact distal pulses.   tachycardic  Pulmonary/Chest: Effort normal and breath sounds normal. No respiratory distress. She exhibits no tenderness.  Abdominal: Soft. Bowel sounds are normal. She exhibits no distension. There is no tenderness. There is no rebound and no guarding.  Musculoskeletal: Normal range of motion. She exhibits no edema and no tenderness.  Neurological: She is alert and oriented to person, place, and time. No cranial nerve deficit.  Skin: Skin is warm and dry.    ED Course  Procedures (including critical care time) Labs Review Labs Reviewed  CBC WITH DIFFERENTIAL - Abnormal; Notable for the following:    WBC 12.5 (*)    RBC 5.12 (*)    Hemoglobin 17.2 (*)    HCT 48.3 (*)    Platelets 453 (*)    Neutrophils Relative % 83 (*)    Neutro Abs 10.4 (*)    All other components within normal limits  COMPREHENSIVE METABOLIC PANEL - Abnormal; Notable for the following:    Chloride 95 (*)    CO2 12 (*)    Glucose, Bld 335 (*)    All other components within normal limits  GLUCOSE, CAPILLARY - Abnormal; Notable for the following:    Glucose-Capillary 334 (*)    All other components within normal limits  POCT I-STAT 3, BLOOD GAS (G3+) - Abnormal; Notable for the following:    pH, Arterial 7.346 (*)    pCO2 arterial 21.8 (*)    Bicarbonate 11.9 (*)    Acid-base deficit 11.0 (*)    All other components within normal limits  URINALYSIS, ROUTINE W REFLEX MICROSCOPIC   CRITICAL CARE Performed by: Sunnie Nielsen Total critical care time: 30 Critical care time was exclusive of separately billable procedures and treating other patients. Critical care was necessary to treat or prevent imminent or life-threatening deterioration. Critical care was time spent personally by me on the following activities: development of treatment plan with patient and/or surrogate as well as nursing, discussions with consultants, evaluation of patient's response  to treatment, examination of patient, obtaining history from patient or surrogate, ordering and performing treatments and interventions, ordering and review of laboratory studies, ordering and review of radiographic studies, pulse oximetry and re-evaluation of patient's condition.  IVFs resus, IV Insulin drip. Serial evaluations, [facility] consulted for admit  Imaging Review Dg Chest Portable 1 View  10/29/2013   CLINICAL DATA:  Hyperglycemia.  EXAM: PORTABLE CHEST - 1 VIEW  COMPARISON:  Chest radiograph performed 10/06/2013  FINDINGS: The lungs are well-aerated and clear. There is no evidence of focal opacification, pleural effusion or pneumothorax.  The cardiomediastinal silhouette is within normal limits. No acute osseous abnormalities are seen.  IMPRESSION: No acute cardiopulmonary process seen.   Electronically Signed   By: Roanna Raider M.D.   On: 10/29/2013 00:31    EKG Interpretation     Ventricular Rate:  114 PR Interval:  150 QRS Duration: 75 QT Interval:  337 QTC Calculation: 464 R Axis:   94 Text Interpretation:  Sinus tachycardia Non-specific ST-t changes Abnormal ECG No old tracing to compare           D/w PCP - plan admit IV insulin drip MDM  Dx: DKA IVF, IV insulin Labs, CXR, UA MED admit     Sunnie Nielsen, MD 10/29/13 2337

## 2013-10-28 NOTE — ED Notes (Signed)
Pt reports giving herself insulin at 2100 this evening. Pt reports not being able to keep food or drink down.

## 2013-10-29 ENCOUNTER — Encounter (HOSPITAL_COMMUNITY): Payer: Self-pay | Admitting: *Deleted

## 2013-10-29 LAB — URINALYSIS, ROUTINE W REFLEX MICROSCOPIC
Leukocytes, UA: NEGATIVE
Nitrite: NEGATIVE
Protein, ur: NEGATIVE mg/dL
Specific Gravity, Urine: 1.024 (ref 1.005–1.030)
pH: 5.5 (ref 5.0–8.0)

## 2013-10-29 LAB — URINE MICROSCOPIC-ADD ON

## 2013-10-29 LAB — GLUCOSE, CAPILLARY
Glucose-Capillary: 193 mg/dL — ABNORMAL HIGH (ref 70–99)
Glucose-Capillary: 137 mg/dL — ABNORMAL HIGH (ref 70–99)
Glucose-Capillary: 166 mg/dL — ABNORMAL HIGH (ref 70–99)
Glucose-Capillary: 238 mg/dL — ABNORMAL HIGH (ref 70–99)
Glucose-Capillary: 212 mg/dL — ABNORMAL HIGH (ref 70–99)
Glucose-Capillary: 105 mg/dL — ABNORMAL HIGH (ref 70–99)

## 2013-10-29 LAB — CBC
MCH: 33.8 pg (ref 26.0–34.0)
MCV: 93.6 fL (ref 78.0–100.0)
Platelets: 364 10*3/uL (ref 150–400)
RDW: 12.2 % (ref 11.5–15.5)
WBC: 11 10*3/uL — ABNORMAL HIGH (ref 4.0–10.5)

## 2013-10-29 LAB — CREATININE, SERUM
GFR calc Af Amer: >90 mL/min (ref >90)
GFR calc non Af Amer: >90 mL/min (ref >90)

## 2013-10-29 MED ORDER — INSULIN ASPART 100 UNIT/ML ~~LOC~~ SOLN
10.0000 [IU] | Freq: Once | SUBCUTANEOUS | Status: AC
  Administered 2013-10-30: 10 [IU] via SUBCUTANEOUS

## 2013-10-29 MED ORDER — INFLUENZA VAC SPLIT QUAD 0.5 ML IM SUSP
0.5000 mL | INTRAMUSCULAR | Status: AC
  Administered 2013-10-30: 0.5 mL via INTRAMUSCULAR
  Filled 2013-10-29: qty 0.5

## 2013-10-29 MED ORDER — ACETAMINOPHEN 500 MG PO TABS: 500.0000 mg | ORAL_TABLET | Freq: Four times a day (QID) | ORAL | Status: DC | PRN

## 2013-10-29 MED ORDER — INSULIN ASPART 100 UNIT/ML ~~LOC~~ SOLN
0.0000 [IU] | Freq: Three times a day (TID) | SUBCUTANEOUS | Status: DC
  Administered 2013-10-29: 5 [IU] via SUBCUTANEOUS
  Administered 2013-10-30: 2 [IU] via SUBCUTANEOUS

## 2013-10-29 MED ORDER — ENOXAPARIN SODIUM 40 MG/0.4ML ~~LOC~~ SOLN
40.0000 mg | SUBCUTANEOUS | Status: DC
  Administered 2013-10-29 – 2013-10-30 (×2): 40 mg via SUBCUTANEOUS
  Filled 2013-10-29 (×2): qty 0.4

## 2013-10-29 MED ORDER — ONDANSETRON HCL 4 MG/2ML IJ SOLN
4.0000 mg | INTRAMUSCULAR | Status: DC | PRN
Start: 2013-10-29 — End: 2013-10-30
  Administered 2013-10-29 (×2): 4 mg via INTRAVENOUS
  Filled 2013-10-29 (×2): qty 2

## 2013-10-29 MED ORDER — PANTOPRAZOLE SODIUM 40 MG PO TBEC
40.0000 mg | DELAYED_RELEASE_TABLET | Freq: Every day | ORAL | Status: DC
  Administered 2013-10-29 – 2013-10-30 (×2): 40 mg via ORAL
  Filled 2013-10-29 (×3): qty 1

## 2013-10-29 MED ORDER — ALUM & MAG HYDROXIDE-SIMETH 200-200-20 MG/5ML PO SUSP
30.0000 mL | Freq: Four times a day (QID) | ORAL | Status: DC | PRN
  Administered 2013-10-30: 30 mL via ORAL
  Filled 2013-10-29: qty 30

## 2013-10-29 MED ORDER — PNEUMOCOCCAL VAC POLYVALENT 25 MCG/0.5ML IJ INJ
0.5000 mL | INJECTION | INTRAMUSCULAR | Status: AC
  Administered 2013-10-30: 0.5 mL via INTRAMUSCULAR
  Filled 2013-10-29: qty 0.5

## 2013-10-29 MED ORDER — SODIUM CHLORIDE 0.9 % IJ SOLN
3.0000 mL | Freq: Two times a day (BID) | INTRAMUSCULAR | Status: DC
  Administered 2013-10-29: 3 mL via INTRAVENOUS

## 2013-10-29 MED ORDER — INSULIN GLARGINE 100 UNIT/ML ~~LOC~~ SOLN
20.0000 [IU] | Freq: Two times a day (BID) | SUBCUTANEOUS | Status: DC
  Administered 2013-10-29 – 2013-10-30 (×3): 20 [IU] via SUBCUTANEOUS
  Filled 2013-10-29 (×4): qty 0.2

## 2013-10-29 MED ORDER — SODIUM CHLORIDE 0.9 % IV SOLN
INTRAVENOUS | Status: DC
  Administered 2013-10-29: 02:00:00 via INTRAVENOUS

## 2013-10-29 NOTE — H&P (Signed)
PCP:   Minda Meo, MD   Chief Complaint:  Nausea and vomiting  HPI: Sheryl White is a very pleasant 37 year old well-known to myself type I diabetic well followed since childhood. She traditionally been intermittently compliant but tries very hard limited somewhat by social circumstances. She has been under fair amount of stress as she and her husband recently split up and noted that she had one episode of vomiting Friday but that sugars were stable. Yesterday, she developed protracted nausea and vomiting leading into presentation to the emergency room which was found to be in diabetic ketoacidosis. She's had no cough or sputum. She denies any abdominal pain or diarrhea. She had no fevers rash joint symptoms. Denies any headaches. She relates that she is not pregnant.   Past Medical History: Past Medical History  Diagnosis Date  . Endometriosis   . Ovarian cyst   . Diabetes mellitus   . Angina   . GERD (gastroesophageal reflux disease)   . Anxiety   . Depression   . Dyslipidemia    Past Surgical History  Procedure Laterality Date  . Laparoscopic salpingoopherectomy    . Endometrial ablation    . Wisdom tooth extraction    . Tubal ligation    . Carpal tunnel release      bilateral  . Colonoscopy    . Upper gastrointestinal endoscopy    . Svd      x 1  . Cesarean section      x 1  . Abdominal hysterectomy      still have left ovary  . Trigger finger release      bilateral    Medications: Prior to Admission medications   Medication Sig Start Date End Date Taking? Authorizing Provider  acetaminophen (TYLENOL) 500 MG tablet Take 1 tablet (500 mg total) by mouth every 6 (six) hours as needed for pain. 10/07/13  Yes Jarome Matin, MD  ALPRAZolam Prudy Feeler) 0.25 MG tablet Take 0.25 mg by mouth 2 (two) times daily as needed for anxiety (chest pains).    Yes Historical Provider, MD  dexlansoprazole (DEXILANT) 60 MG capsule Take 1 capsule (60 mg total) by mouth daily. 04/07/13   Yes Meryl Dare, MD  insulin glargine (LANTUS) 100 UNIT/ML injection Inject 0.2 mLs (20 Units total) into the skin 2 (two) times daily. 10/07/13  Yes Jarome Matin, MD  insulin lispro (HUMALOG) 100 UNIT/ML injection Inject 4-15 Units into the skin 6 (six) times daily. Per sliding scale 10/07/13  Yes Jarome Matin, MD  omeprazole (PRILOSEC) 20 MG capsule Take 20 mg by mouth daily.   Yes Historical Provider, MD    Allergies:  No Known Allergies  Social History:  reports that she has been smoking Cigarettes.  She has a 5 pack-year smoking history. She has never used smokeless tobacco. She reports that she drinks alcohol. She reports that she does not use illicit drugs.  Family History: Family History  Problem Relation Age of Onset  . Colon polyps Father   . Diabetes Son   . Diabetes Sister   . Cancer Father     ?    Physical Exam: Filed Vitals:   10/29/13 0100 10/29/13 0130 10/29/13 0205 10/29/13 0513  BP: 116/73 118/67 116/67 120/59  Pulse:  109 104 111  Temp:   98.7 F (37.1 C) 98.1 F (36.7 C)  TempSrc:   Oral Oral  Resp: 19 20 18 18   Height:   5' 6.5" (1.689 m)   Weight:   72.394 kg (159  lb 9.6 oz)   SpO2: 100% 100% 99% 100%   General appearance: alert, cooperative and no distress Head: Normocephalic, without obvious abnormality, atraumatic Eyes: conjunctivae/corneas clear. PERRL, EOM's intact.  Nose: Nares normal. Septum midline. Mucosa normal. No drainage or sinus tenderness. Throat: lips, mucosa, and tongue normal; teeth and gums normal Neck: no adenopathy, no carotid bruit, no JVD and thyroid not enlarged, symmetric, no tenderness/mass/nodules Resp: clear to auscultation bilaterally Cardio: regular rate and rhythm, S1, S2 normal, no murmur, click, rub or gallop GI: soft, non-tender; bowel sounds normal; no masses,  no organomegaly Extremities: extremities normal, atraumatic, no cyanosis or edema Pulses: 2+ and symmetric Lymph nodes: Cervical adenopathy: no  cervical lymphadenopathy Neurologic: Alert and oriented X 3, normal strength and tone. Normal symmetric reflexes.     Labs on Admission:   Recent Labs  10/28/13 2243  NA 137  K 3.9  CL 95*  CO2 12*  GLUCOSE 335*  BUN 11  CREATININE 0.72  CALCIUM 9.9    Recent Labs  10/28/13 2243  AST 15  ALT 13  ALKPHOS 104  BILITOT 0.7  PROT 7.9  ALBUMIN 4.3   No results found for this basename: LIPASE, AMYLASE,  in the last 72 hours  Recent Labs  10/28/13 2243  WBC 12.5*  NEUTROABS 10.4*  HGB 17.2*  HCT 48.3*  MCV 94.3  PLT 453*   No results found for this basename: CKTOTAL, CKMB, CKMBINDEX, TROPONINI,  in the last 72 hours No results found for this basename: TSH, T4TOTAL, FREET3, T3FREE, THYROIDAB,  in the last 72 hours No results found for this basename: VITAMINB12, FOLATE, FERRITIN, TIBC, IRON, RETICCTPCT,  in the last 72 hours  Radiological Exams on Admission: Dg Chest Portable 1 View  10/29/2013   CLINICAL DATA:  Hyperglycemia.  EXAM: PORTABLE CHEST - 1 VIEW  COMPARISON:  Chest radiograph performed 10/06/2013  FINDINGS: The lungs are well-aerated and clear. There is no evidence of focal opacification, pleural effusion or pneumothorax.  The cardiomediastinal silhouette is within normal limits. No acute osseous abnormalities are seen.  IMPRESSION: No acute cardiopulmonary process seen.   Electronically Signed   By: Roanna Raider M.D.   On: 10/29/2013 00:31   Dg Chest Portable 1 View  10/06/2013   CLINICAL DATA:  Hyperglycemia.  EXAM: PORTABLE CHEST - 1 VIEW  COMPARISON:  None.  FINDINGS: The heart size and mediastinal contours are within normal limits. Both lungs are clear. The visualized skeletal structures are unremarkable.  IMPRESSION: No active disease.   Electronically Signed   By: Andreas Newport M.D.   On: 10/06/2013 23:27   Orders placed during the hospital encounter of 10/28/13  . ED EKG  . ED EKG  . EKG 12-LEAD  . EKG 12-LEAD    Assessment/Plan #1  diabetic ketoacidosis mild #2 diabetes mellitus type 1 with poor control #3 nausea and vomiting secondary to above with dehydration  Continue IV fluids, IV insulin transition when able to oral diet and baseline regimen   [name] A 10/29/2013, 10:37 AM

## 2013-10-29 NOTE — ED Notes (Signed)
Pt refused starting a second IV.

## 2013-10-29 NOTE — ED Notes (Signed)
Opitz, MD at bedside. 

## 2013-10-29 NOTE — Progress Notes (Signed)
Paged and talked to Dr. Jacky Kindle regarding patient arrival to the unit. Updated MD on patient current condition, CBG and orders. New orders given and carried out. Patient comfortable in bed. No complaints at this time. Will continue to monitor. KYoung RN

## 2013-10-30 LAB — GLUCOSE, CAPILLARY
Glucose-Capillary: 139 mg/dL — ABNORMAL HIGH (ref 70–99)
Glucose-Capillary: 317 mg/dL — ABNORMAL HIGH (ref 70–99)

## 2013-10-30 LAB — BASIC METABOLIC PANEL
BUN: 5 mg/dL — ABNORMAL LOW (ref 6–23)
CO2: 19 mEq/L (ref 19–32)
Chloride: 106 mEq/L (ref 96–112)
Creatinine, Ser: 0.57 mg/dL (ref 0.50–1.10)
GFR calc non Af Amer: >90 mL/min (ref >90)
Glucose, Bld: 144 mg/dL — ABNORMAL HIGH (ref 70–99)

## 2013-10-30 LAB — CBC
HCT: 38.7 % (ref 36.0–46.0)
MCH: 33.3 pg (ref 26.0–34.0)
MCV: 93.9 fL (ref 78.0–100.0)
RBC: 4.12 MIL/uL (ref 3.87–5.11)
RDW: 12 % (ref 11.5–15.5)
WBC: 6.5 10*3/uL (ref 4.0–10.5)

## 2013-10-30 NOTE — Care Management Note (Signed)
  Page 1 of 1   10/30/2013     3:33:07 PM   CARE MANAGEMENT NOTE 10/30/2013  Patient:  Sheryl White, Sheryl White   Account Number:  [id number]  Date Initiated:  10/30/2013  Documentation initiated by:  Ronny Flurry  Subjective/Objective Assessment:     Action/Plan:   Anticipated DC Date:  10/30/2013   Anticipated DC Plan:  HOME/SELF CARE      DC Planning Services  MATCH Program      Choice offered to / List presented to:             Status of service:  Completed, signed off Medicare Important Message given?   (If response is "NO", the following Medicare IM given date fields will be blank) Date Medicare IM given:   Date Additional Medicare IM given:    Discharge Disposition:  HOME/SELF CARE  Per UR Regulation:  Reviewed for med. necessity/level of care/duration of stay  If discussed at Long Length of Stay Meetings, dates discussed:    Comments:

## 2013-10-30 NOTE — Discharge Summary (Signed)
DISCHARGE SUMMARY  Sheryl White  MR#: [id number]  DOB:1976/06/29  Date of Admission: 10/28/2013 Date of Discharge: 10/30/2013  Attending Physician:Ellington Cornia A  Patient's YTK:[id number],ATFTDDU A, MD  Consults:  none  Discharge Diagnoses: #1 diabetic ketoacidosis #2 diabetes mellitus type 1 poor control #3 nausea and vomiting resume secondary to viral gastroenteritis   Discharge Medications:   Medication List    STOP taking these medications       dexlansoprazole 60 MG capsule  Commonly known as:  DEXILANT      TAKE these medications       acetaminophen 500 MG tablet  Commonly known as:  TYLENOL  Take 1 tablet (500 mg total) by mouth every 6 (six) hours as needed for pain.     ALPRAZolam 0.25 MG tablet  Commonly known as:  XANAX  Take 0.25 mg by mouth 2 (two) times daily as needed for anxiety (chest pains).     insulin glargine 100 UNIT/ML injection  Commonly known as:  LANTUS  Inject 0.2 mLs (20 Units total) into the skin 2 (two) times daily.     insulin lispro 100 UNIT/ML injection  Commonly known as:  HUMALOG  Inject 4-15 Units into the skin 6 (six) times daily. Per sliding scale     omeprazole 20 MG capsule  Commonly known as:  PRILOSEC  Take 20 mg by mouth daily.        Hospital Procedures: Dg Chest Portable 1 View  10/29/2013   CLINICAL DATA:  Hyperglycemia.  EXAM: PORTABLE CHEST - 1 VIEW  COMPARISON:  Chest radiograph performed 10/06/2013  FINDINGS: The lungs are well-aerated and clear. There is no evidence of focal opacification, pleural effusion or pneumothorax.  The cardiomediastinal silhouette is within normal limits. No acute osseous abnormalities are seen.  IMPRESSION: No acute cardiopulmonary process seen.   Electronically Signed   By: Roanna Raider M.D.   On: 10/29/2013 00:31   Dg Chest Portable 1 View  10/06/2013   CLINICAL DATA:  Hyperglycemia.  EXAM: PORTABLE CHEST - 1 VIEW  COMPARISON:  None.  FINDINGS: The heart size and  mediastinal contours are within normal limits. Both lungs are clear. The visualized skeletal structures are unremarkable.  IMPRESSION: No active disease.   Electronically Signed   By: Andreas Newport M.D.   On: 10/06/2013 23:27    History of Present Illness:  Sheryl White is a very pleasant 37 year old well-known to myself type I diabetic well followed since childhood. She traditionally been intermittently compliant but tries very hard limited somewhat by social circumstances. She has been under fair amount of stress as she and her husband recently split up and noted that she had one episode of vomiting Friday but that sugars were stable. Yesterday, she developed protracted nausea and vomiting leading into presentation to the emergency room which was found to be in diabetic ketoacidosis. She's had no cough or sputum. She denies any abdominal pain or diarrhea. She had no fevers rash joint symptoms. Denies any headaches. She relates that she is not pregnant.     Hospital Course: Patient was admitted aggressively hydrated continued on insulin drip until her acidosis cleared. She is transition to Lantus insulin and every 4 hour NovoLog with good stability of blood sugars. She had no further nausea and vomiting was tolerating regular diet well. She is discharged improved in stable condition with education on early intervention should she decline  Day of Discharge Exam BP 118/78  Pulse 80  Temp(Src) 97.9 F (36.6 C) (Oral)  Resp 18  Ht 5' 6.5" (1.689 m)  Wt 72.394 kg (159 lb 9.6 oz)  BMI 25.38 kg/m2  SpO2 98%  Physical Exam: General appearance: alert, cooperative and no distress Eyes: no scleral icterus Throat: oropharynx moist without erythema Resp: clear to auscultation bilaterally Cardio: regular rate and rhythm, S1, S2 normal, no murmur, click, rub or gallop Extremities: no clubbing, cyanosis or edema Abdomen is soft nontender  Discharge Labs:  Recent Labs  10/28/13 2243 10/29/13 1129  10/30/13 0650  NA 137  --  139  K 3.9  --  3.2*  CL 95*  --  106  CO2 12*  --  19  GLUCOSE 335*  --  144*  BUN 11  --  5*  CREATININE 0.72 0.59 0.57  CALCIUM 9.9  --  8.4    Recent Labs  10/28/13 2243  AST 15  ALT 13  ALKPHOS 104  BILITOT 0.7  PROT 7.9  ALBUMIN 4.3    Recent Labs  10/28/13 2243 10/29/13 1129 10/30/13 0650  WBC 12.5* 11.0* 6.5  NEUTROABS 10.4*  --   --   HGB 17.2* 14.2 13.7  HCT 48.3* 39.3 38.7  MCV 94.3 93.6 93.9  PLT 453* 364 293   No results found for this basename: CKTOTAL, CKMB, CKMBINDEX, TROPONINI,  in the last 72 hours No results found for this basename: TSH, T4TOTAL, FREET3, T3FREE, THYROIDAB,  in the last 72 hours No results found for this basename: VITAMINB12, FOLATE, FERRITIN, TIBC, IRON, RETICCTPCT,  in the last 72 hours  Discharge instructions:     Discharge Orders   Future Orders Complete By Expires   Diet - low sodium heart healthy  As directed    Increase activity slowly  As directed       Disposition: Home in improved and stable condition  Follow-up Appts: Follow-up with Dr. Jacky Kindle at [facility] in 1 week.  Call for appointment.  Condition on Discharge: Stable  Tests Needing Follow-up: None  Signed: Omolara Carol A 10/30/2013, 7:51 AM

## 2013-10-30 NOTE — Progress Notes (Signed)
Discharge instructions reviewed with pt.  Pt verbalized understanding and had no questions.  Pt discharged in stable condition.  Kerron Sedano Lindsay   

## 2014-06-08 ENCOUNTER — Other Ambulatory Visit: Payer: Self-pay

## 2014-07-20 ENCOUNTER — Encounter: Payer: Self-pay | Admitting: Gastroenterology

## 2014-10-15 ENCOUNTER — Encounter (HOSPITAL_COMMUNITY): Payer: Self-pay | Admitting: *Deleted

## 2015-03-10 ENCOUNTER — Encounter (HOSPITAL_COMMUNITY): Payer: Self-pay | Admitting: Nurse Practitioner

## 2015-03-10 ENCOUNTER — Emergency Department (HOSPITAL_COMMUNITY)
Admission: EM | Admit: 2015-03-10 | Discharge: 2015-03-10 | Disposition: A | Discharge status: Home / Self Care | Payer: Managed Care, Other (non HMO) | Attending: Emergency Medicine | Admitting: Emergency Medicine

## 2015-03-10 DIAGNOSIS — M545 Low back pain, unspecified: Secondary | ICD-10-CM

## 2015-03-10 DIAGNOSIS — E119 Type 2 diabetes mellitus without complications: Secondary | ICD-10-CM | POA: Insufficient documentation

## 2015-03-10 DIAGNOSIS — Z79899 Other long term (current) drug therapy: Secondary | ICD-10-CM | POA: Insufficient documentation

## 2015-03-10 DIAGNOSIS — N390 Urinary tract infection, site not specified: Secondary | ICD-10-CM | POA: Diagnosis not present

## 2015-03-10 DIAGNOSIS — F329 Major depressive disorder, single episode, unspecified: Secondary | ICD-10-CM | POA: Diagnosis not present

## 2015-03-10 DIAGNOSIS — K219 Gastro-esophageal reflux disease without esophagitis: Secondary | ICD-10-CM | POA: Insufficient documentation

## 2015-03-10 DIAGNOSIS — F419 Anxiety disorder, unspecified: Secondary | ICD-10-CM | POA: Diagnosis not present

## 2015-03-10 DIAGNOSIS — M549 Dorsalgia, unspecified: Secondary | ICD-10-CM | POA: Diagnosis present

## 2015-03-10 DIAGNOSIS — Z794 Long term (current) use of insulin: Secondary | ICD-10-CM | POA: Insufficient documentation

## 2015-03-10 DIAGNOSIS — Z720 Tobacco use: Secondary | ICD-10-CM | POA: Diagnosis not present

## 2015-03-10 DIAGNOSIS — Z8679 Personal history of other diseases of the circulatory system: Secondary | ICD-10-CM | POA: Insufficient documentation

## 2015-03-10 LAB — URINALYSIS, ROUTINE W REFLEX MICROSCOPIC
Bilirubin Urine: NEGATIVE
KETONES UR: 40 mg/dL — AB
Nitrite: POSITIVE — AB
Protein, ur: NEGATIVE mg/dL
Specific Gravity, Urine: 1.023 (ref 1.005–1.030)
UROBILINOGEN UA: 0.2 mg/dL (ref 0.0–1.0)
pH: 5 (ref 5.0–8.0)

## 2015-03-10 LAB — URINE MICROSCOPIC-ADD ON

## 2015-03-10 MED ORDER — CEPHALEXIN 500 MG PO CAPS: 500.0000 mg | ORAL_CAPSULE | Freq: Four times a day (QID) | ORAL | Status: DC

## 2015-03-10 MED ORDER — HYDROCODONE-ACETAMINOPHEN 5-325 MG PO TABS: 2.0000 | ORAL_TABLET | ORAL | Status: DC | PRN

## 2015-03-10 NOTE — ED Provider Notes (Signed)
CSN: [id number]     Arrival date & time 03/10/15  1512 History   First MD Initiated Contact with Patient 03/10/15 1531     Chief Complaint  Patient presents with  . Back Pain     (Consider location/radiation/quality/duration/timing/severity/associated sxs/prior Treatment) Patient is a 39 y.o. female presenting with dysuria. The history is provided by the patient. No language interpreter was used.  Dysuria Pain quality:  Aching Pain severity:  Moderate Onset quality:  Gradual Duration:  6 days Timing:  Constant Progression:  Worsening Chronicity:  New Recent urinary tract infections: no   Relieved by:  Nothing Worsened by:  Nothing tried Ineffective treatments:  None tried Associated symptoms: no nausea and no vomiting   Risk factors: no recurrent urinary tract infections   Pt complains of pain in her back since Tuesday, frequent urination  Past Medical History  Diagnosis Date  . Endometriosis   . Ovarian cyst   . Diabetes mellitus   . Angina   . GERD (gastroesophageal reflux disease)   . Anxiety   . Depression   . Dyslipidemia    Past Surgical History  Procedure Laterality Date  . Laparoscopic salpingoopherectomy    . Endometrial ablation    . Wisdom tooth extraction    . Tubal ligation    . Carpal tunnel release      bilateral  . Colonoscopy    . Upper gastrointestinal endoscopy    . Svd      x 1  . Cesarean section      x 1  . Abdominal hysterectomy      still have left ovary  . Trigger finger release      bilateral   Family History  Problem Relation Age of Onset  . Colon polyps Father   . Diabetes Son   . Diabetes Sister   . Cancer Father     ?   History  Substance Use Topics  . Smoking status: Current Every Day Smoker -- 1.00 packs/day for 5 years    Types: Cigarettes  . Smokeless tobacco: Never Used  . Alcohol Use: 0.0 oz/week    2-3 drink(s) per week     Comment: occasionally   OB History    Gravida Para Term Preterm AB TAB SAB Ectopic  Multiple Living   4 2 1 1 2  2   2      Review of Systems  Gastrointestinal: Negative for nausea and vomiting.  Genitourinary: Positive for dysuria.  All other systems reviewed and are negative.     Allergies  Review of patient's allergies indicates no known allergies.  Home Medications   Prior to Admission medications   Medication Sig Start Date End Date Taking? Authorizing Provider  acetaminophen (TYLENOL) 500 MG tablet Take 1 tablet (500 mg total) by mouth every 6 (six) hours as needed for pain. 10/07/13   Leanna Battles, MD  ALPRAZolam Duanne Moron) 0.25 MG tablet Take 0.25 mg by mouth 2 (two) times daily as needed for anxiety (chest pains).     Historical Provider, MD  insulin glargine (LANTUS) 100 UNIT/ML injection Inject 0.2 mLs (20 Units total) into the skin 2 (two) times daily. 10/07/13   Leanna Battles, MD  insulin lispro (HUMALOG) 100 UNIT/ML injection Inject 4-15 Units into the skin 6 (six) times daily. Per sliding scale 10/07/13   Leanna Battles, MD  omeprazole (PRILOSEC) 20 MG capsule Take 20 mg by mouth daily.    Historical Provider, MD   BP 135/84 mmHg  Pulse  122  Temp(Src) 98.7 F (37.1 C) (Oral)  Resp 20  SpO2 99% Physical Exam  Constitutional: She is oriented to person, place, and time. She appears well-developed and well-nourished.  HENT:  Head: Normocephalic and atraumatic.  Eyes: Conjunctivae and EOM are normal. Pupils are equal, round, and reactive to light.  Neck: Normal range of motion. Neck supple.  Cardiovascular: Normal rate and normal heart sounds.   Pulmonary/Chest: Effort normal.  Abdominal: Soft.  Musculoskeletal: Normal range of motion.  Neurological: She is alert and oriented to person, place, and time. She has normal reflexes.  Skin: Skin is warm.  Psychiatric: She has a normal mood and affect.  Nursing note and vitals reviewed.   ED Course  Procedures (including critical care time) Labs Review Labs Reviewed  URINALYSIS, ROUTINE W  REFLEX MICROSCOPIC    Imaging Review No results found.   EKG Interpretation None      MDM   Final diagnoses:  UTI (lower urinary tract infection)  Bilateral low back pain without sciatica    Keflex Hydrocodone See Dr. Reynaldo Minium for recheck this week. Urine culture pending    Fransico Meadow, PA-C 03/10/15 [place], MD 03/10/15 [phone]

## 2015-03-10 NOTE — ED Notes (Signed)
She has lower back pain since Tuesday. Pain has started to radiate into flank and LLQ. shes had urinary frequency and chills also

## 2015-03-10 NOTE — Discharge Instructions (Signed)

## 2015-03-30 ENCOUNTER — Emergency Department (HOSPITAL_COMMUNITY): Payer: Managed Care, Other (non HMO)

## 2015-03-30 ENCOUNTER — Other Ambulatory Visit (HOSPITAL_COMMUNITY): Payer: Self-pay

## 2015-03-30 ENCOUNTER — Inpatient Hospital Stay (HOSPITAL_COMMUNITY)
Admission: EM | Admit: 2015-03-30 | Discharge: 2015-04-02 | DRG: 638 | Disposition: A | Discharge status: Home / Self Care | Payer: Managed Care, Other (non HMO) | Attending: Internal Medicine | Admitting: Internal Medicine

## 2015-03-30 ENCOUNTER — Encounter (HOSPITAL_COMMUNITY): Payer: Self-pay

## 2015-03-30 DIAGNOSIS — K219 Gastro-esophageal reflux disease without esophagitis: Secondary | ICD-10-CM | POA: Diagnosis present

## 2015-03-30 DIAGNOSIS — Z90710 Acquired absence of both cervix and uterus: Secondary | ICD-10-CM

## 2015-03-30 DIAGNOSIS — E1065 Type 1 diabetes mellitus with hyperglycemia: Secondary | ICD-10-CM

## 2015-03-30 DIAGNOSIS — E1010 Type 1 diabetes mellitus with ketoacidosis without coma: Secondary | ICD-10-CM | POA: Diagnosis not present

## 2015-03-30 DIAGNOSIS — N390 Urinary tract infection, site not specified: Secondary | ICD-10-CM | POA: Diagnosis not present

## 2015-03-30 DIAGNOSIS — Z79891 Long term (current) use of opiate analgesic: Secondary | ICD-10-CM

## 2015-03-30 DIAGNOSIS — Z794 Long term (current) use of insulin: Secondary | ICD-10-CM

## 2015-03-30 DIAGNOSIS — F418 Other specified anxiety disorders: Secondary | ICD-10-CM | POA: Diagnosis present

## 2015-03-30 DIAGNOSIS — N179 Acute kidney failure, unspecified: Secondary | ICD-10-CM | POA: Diagnosis present

## 2015-03-30 DIAGNOSIS — A419 Sepsis, unspecified organism: Secondary | ICD-10-CM

## 2015-03-30 DIAGNOSIS — E860 Dehydration: Secondary | ICD-10-CM | POA: Diagnosis present

## 2015-03-30 DIAGNOSIS — R509 Fever, unspecified: Secondary | ICD-10-CM

## 2015-03-30 DIAGNOSIS — E1110 Type 2 diabetes mellitus with ketoacidosis without coma: Secondary | ICD-10-CM | POA: Diagnosis present

## 2015-03-30 DIAGNOSIS — Z79899 Other long term (current) drug therapy: Secondary | ICD-10-CM

## 2015-03-30 DIAGNOSIS — N12 Tubulo-interstitial nephritis, not specified as acute or chronic: Secondary | ICD-10-CM | POA: Diagnosis present

## 2015-03-30 DIAGNOSIS — E785 Hyperlipidemia, unspecified: Secondary | ICD-10-CM | POA: Diagnosis present

## 2015-03-30 DIAGNOSIS — N200 Calculus of kidney: Secondary | ICD-10-CM | POA: Diagnosis present

## 2015-03-30 DIAGNOSIS — R112 Nausea with vomiting, unspecified: Secondary | ICD-10-CM

## 2015-03-30 DIAGNOSIS — F17210 Nicotine dependence, cigarettes, uncomplicated: Secondary | ICD-10-CM | POA: Diagnosis present

## 2015-03-30 LAB — CBC WITH DIFFERENTIAL/PLATELET
BASOS ABS: 0 10*3/uL (ref 0.0–0.1)
Basophils Relative: 0 % (ref 0–1)
Eosinophils Absolute: 0 10*3/uL (ref 0.0–0.7)
Eosinophils Relative: 0 % (ref 0–5)
HEMATOCRIT: 41.3 % (ref 36.0–46.0)
HEMOGLOBIN: 14.5 g/dL (ref 12.0–15.0)
LYMPHS ABS: 0.7 10*3/uL (ref 0.7–4.0)
Lymphocytes Relative: 5 % — ABNORMAL LOW (ref 12–46)
MCH: 31.1 pg (ref 26.0–34.0)
MCHC: 35.1 g/dL (ref 30.0–36.0)
MCV: 88.6 fL (ref 78.0–100.0)
Monocytes Absolute: 1.1 10*3/uL — ABNORMAL HIGH (ref 0.1–1.0)
Monocytes Relative: 8 % (ref 3–12)
NEUTROS ABS: 13 10*3/uL — AB (ref 1.7–7.7)
Neutrophils Relative %: 87 % — ABNORMAL HIGH (ref 43–77)
Platelets: 272 10*3/uL (ref 150–400)
RBC: 4.66 MIL/uL (ref 3.87–5.11)
RDW: 12.1 % (ref 11.5–15.5)
WBC: 14.8 10*3/uL — AB (ref 4.0–10.5)

## 2015-03-30 LAB — BASIC METABOLIC PANEL
ANION GAP: 16 — AB (ref 5–15)
BUN: 14 mg/dL (ref 6–23)
CHLORIDE: 96 mmol/L (ref 96–112)
CO2: 16 mmol/L — AB (ref 19–32)
Calcium: 8.5 mg/dL (ref 8.4–10.5)
Creatinine, Ser: 1.21 mg/dL — ABNORMAL HIGH (ref 0.50–1.10)
GFR calc Af Amer: 64 mL/min — ABNORMAL LOW (ref >90)
GFR calc non Af Amer: 55 mL/min — ABNORMAL LOW (ref >90)
Glucose, Bld: 305 mg/dL — ABNORMAL HIGH (ref 70–99)
POTASSIUM: 4.2 mmol/L (ref 3.5–5.1)
Sodium: 128 mmol/L — ABNORMAL LOW (ref 135–145)

## 2015-03-30 LAB — I-STAT VENOUS BLOOD GAS, ED
ACID-BASE DEFICIT: 5 mmol/L — AB (ref 0.0–2.0)
Bicarbonate: 18.7 mEq/L — ABNORMAL LOW (ref 20.0–24.0)
O2 SAT: 71 %
PCO2 VEN: 31.5 mmHg — AB (ref 45.0–50.0)
TCO2: 20 mmol/L (ref 0–100)
pH, Ven: 7.382 — ABNORMAL HIGH (ref 7.250–7.300)
pO2, Ven: 37 mmHg (ref 30.0–45.0)

## 2015-03-30 LAB — URINALYSIS, ROUTINE W REFLEX MICROSCOPIC
Leukocytes, UA: NEGATIVE
NITRITE: NEGATIVE
PH: 5.5 (ref 5.0–8.0)
PROTEIN: 30 mg/dL — AB
SPECIFIC GRAVITY, URINE: 1.024 (ref 1.005–1.030)
Urobilinogen, UA: 0.2 mg/dL (ref 0.0–1.0)

## 2015-03-30 LAB — URINE MICROSCOPIC-ADD ON

## 2015-03-30 LAB — MAGNESIUM: MAGNESIUM: 1.7 mg/dL (ref 1.5–2.5)

## 2015-03-30 LAB — CBG MONITORING, ED: GLUCOSE-CAPILLARY: 285 mg/dL — AB (ref 70–99)

## 2015-03-30 MED ORDER — CEFTRIAXONE SODIUM 1 G IJ SOLR
1.0000 g | Freq: Once | INTRAMUSCULAR | Status: AC
  Administered 2015-03-30: 1 g via INTRAVENOUS
  Filled 2015-03-30: qty 10

## 2015-03-30 MED ORDER — SODIUM CHLORIDE 0.9 % IV BOLUS (SEPSIS)
1000.0000 mL | Freq: Once | INTRAVENOUS | Status: AC
  Administered 2015-03-30: 1000 mL via INTRAVENOUS

## 2015-03-30 MED ORDER — DEXTROSE-NACL 5-0.45 % IV SOLN
INTRAVENOUS | Status: DC
  Administered 2015-03-31: 01:00:00 via INTRAVENOUS

## 2015-03-30 MED ORDER — SODIUM CHLORIDE 0.9 % IV BOLUS (SEPSIS)
2454.0000 mL | Freq: Once | INTRAVENOUS | Status: AC
  Administered 2015-03-30: 2454 mL via INTRAVENOUS

## 2015-03-30 MED ORDER — SODIUM CHLORIDE 0.9 % IV SOLN
INTRAVENOUS | Status: DC
  Administered 2015-03-31: 1.3 [IU]/h via INTRAVENOUS
  Filled 2015-03-30: qty 2.5

## 2015-03-30 MED ORDER — MORPHINE SULFATE 4 MG/ML IJ SOLN
4.0000 mg | Freq: Once | INTRAMUSCULAR | Status: AC
  Administered 2015-03-30: 4 mg via INTRAVENOUS
  Filled 2015-03-30: qty 1

## 2015-03-30 NOTE — ED Notes (Signed)
i-stat CG4 result shown to Dr. Ralene Bathe

## 2015-03-30 NOTE — ED Provider Notes (Signed)
CSN: [id number]     Arrival date & time 03/30/15  1827 History   First MD Initiated Contact with Patient 03/30/15 1857     Chief Complaint  Patient presents with  . Vomiting  . Proteinuria     (Consider location/radiation/quality/duration/timing/severity/associated sxs/prior Treatment) HPI Sheryl White is a 39 y.o. female with history of diabetes-insulin-dependent, comes in for evaluation of right-sided flank pain, nausea and vomiting. Patient states for the past week she has had intermittent back pain that radiates around her right flank. She reports being seen in the ED last week for UTI, was treated with Keflex, reports she was feeling a little bit better but yesterday morning she became nauseous, the back pain returned and she reports she has vomited "at least 10 times", nonbloody and nonbilious. She reports feeling hot at home, but no measured fevers. She denies any headache, chest pain, shortness of breath, abdominal pain, pelvic pain, vaginal bleeding or discharge, rash.  Past Medical History  Diagnosis Date  . Endometriosis   . Ovarian cyst   . Diabetes mellitus   . Angina   . GERD (gastroesophageal reflux disease)   . Anxiety   . Depression   . Dyslipidemia    Past Surgical History  Procedure Laterality Date  . Laparoscopic salpingoopherectomy    . Endometrial ablation    . Wisdom tooth extraction    . Tubal ligation    . Carpal tunnel release      bilateral  . Colonoscopy    . Upper gastrointestinal endoscopy    . Svd      x 1  . Cesarean section      x 1  . Abdominal hysterectomy      still have left ovary  . Trigger finger release      bilateral   Family History  Problem Relation Age of Onset  . Colon polyps Father   . Diabetes Son   . Diabetes Sister   . Cancer Father     ?   History  Substance Use Topics  . Smoking status: Current Every Day Smoker -- 1.00 packs/day for 5 years    Types: Cigarettes  . Smokeless tobacco: Never Used  . Alcohol  Use: 0.0 oz/week    2-3 drink(s) per week     Comment: occasionally   OB History    Gravida Para Term Preterm AB TAB SAB Ectopic Multiple Living   4 2 1 1 2  2   2      Review of Systems A 10 point review of systems was completed and was negative except for pertinent positives and negatives as mentioned in the history of present illness     Allergies  Review of patient's allergies indicates no known allergies.  Home Medications   Prior to Admission medications   Medication Sig Start Date End Date Taking? Authorizing Provider  ALPRAZolam Duanne Moron) 0.25 MG tablet Take 0.25 mg by mouth daily as needed (panic attacks).    Yes Historical Provider, MD  insulin aspart (NOVOLOG) 100 UNIT/ML injection Inject 3-12 Units into the skin See admin instructions. Inject 3-12 units per sliding scale subcutaneously 3-4 times daily with meals   Yes Historical Provider, MD  insulin glargine (LANTUS) 100 UNIT/ML injection Inject 0.2 mLs (20 Units total) into the skin 2 (two) times daily. Patient taking differently: Inject 30 Units into the skin 2 (two) times daily.  10/07/13  Yes Leanna Battles, MD  acetaminophen (TYLENOL) 500 MG tablet Take 1 tablet (500 mg  total) by mouth every 6 (six) hours as needed for pain. Patient not taking: Reported on 03/30/2015 10/07/13   Leanna Battles, MD  cephALEXin (KEFLEX) 500 MG capsule Take 1 capsule (500 mg total) by mouth 4 (four) times daily. Patient not taking: Reported on 03/30/2015 03/10/15   Fransico Meadow, PA-C  HYDROcodone-acetaminophen (NORCO/VICODIN) 5-325 MG per tablet Take 2 tablets by mouth every 4 (four) hours as needed. Patient not taking: Reported on 03/30/2015 03/10/15   Fransico Meadow, PA-C  insulin lispro (HUMALOG) 100 UNIT/ML injection Inject 4-15 Units into the skin 6 (six) times daily. Per sliding scale Patient not taking: Reported on 03/10/2015 10/07/13   Leanna Battles, MD   BP 122/67 mmHg  Pulse 105  Temp(Src) 99.7 F (37.6 C) (Oral)  Resp 24   Ht 5\' 6"  (1.676 m)  Wt 157 lb (71.215 kg)  BMI 25.35 kg/m2  SpO2 96% Physical Exam  Constitutional: She is oriented to person, place, and time. She appears well-developed and well-nourished.  Appears uncomfortable  HENT:  Head: Normocephalic and atraumatic.  Mouth/Throat: Oropharynx is clear and moist.  Eyes: Conjunctivae are normal. Pupils are equal, round, and reactive to light. Right eye exhibits no discharge. Left eye exhibits no discharge. No scleral icterus.  Neck: Neck supple.  Cardiovascular: Normal rate, regular rhythm and normal heart sounds.   Pulmonary/Chest: Effort normal and breath sounds normal. No respiratory distress. She has no wheezes. She has no rales.  Abdominal: Soft. She exhibits no distension and no mass. There is no tenderness. There is no rebound and no guarding.  No CVA tenderness  Musculoskeletal: Normal range of motion. She exhibits no edema or tenderness.  Neurological: She is alert and oriented to person, place, and time.  Cranial Nerves II-XII grossly intact  Skin: Skin is warm and dry. No rash noted.  Psychiatric: She has a normal mood and affect.  Nursing note and vitals reviewed.   ED Course  Procedures (including critical care time) Labs Review Labs Reviewed  CBC WITH DIFFERENTIAL/PLATELET - Abnormal; Notable for the following:    WBC 14.8 (*)    Neutrophils Relative % 87 (*)    Neutro Abs 13.0 (*)    Lymphocytes Relative 5 (*)    Monocytes Absolute 1.1 (*)    All other components within normal limits  URINALYSIS, ROUTINE W REFLEX MICROSCOPIC - Abnormal; Notable for the following:    Glucose, UA >1000 (*)    Hgb urine dipstick MODERATE (*)    Bilirubin Urine MODERATE (*)    Ketones, ur >80 (*)    Protein, ur 30 (*)    All other components within normal limits  BASIC METABOLIC PANEL - Abnormal; Notable for the following:    Sodium 128 (*)    CO2 16 (*)    Glucose, Bld 305 (*)    Creatinine, Ser 1.21 (*)    GFR calc non Af Amer 55 (*)     GFR calc Af Amer 64 (*)    Anion gap 16 (*)    All other components within normal limits  CBG MONITORING, ED - Abnormal; Notable for the following:    Glucose-Capillary 285 (*)    All other components within normal limits  I-STAT VENOUS BLOOD GAS, ED - Abnormal; Notable for the following:    pH, Ven 7.382 (*)    pCO2, Ven 31.5 (*)    Bicarbonate 18.7 (*)    Acid-base deficit 5.0 (*)    All other components within normal limits  URINE CULTURE  URINE MICROSCOPIC-ADD ON  MAGNESIUM  BLOOD GAS, VENOUS  PREGNANCY, URINE  CBG MONITORING, ED    Imaging Review Ct Renal Stone Study  03/30/2015   CLINICAL DATA:  Proteinuria, low energy for 1 week. On on antibiotics for urinary tract infection. Flank pain rating to LEFT lower quadrant with fevers and chills for 5 days. Vomiting. History of endometriosis, diabetes, ovarian cyst.  EXAM: CT ABDOMEN AND PELVIS WITHOUT CONTRAST  TECHNIQUE: Multidetector CT imaging of the abdomen and pelvis was performed following the standard protocol without IV contrast.  COMPARISON:  None.  FINDINGS: LUNG BASES: Included view of the lung bases are clear. The visualized heart and pericardium are unremarkable.  KIDNEYS/BLADDER: Kidneys are orthotopic, demonstrating normal size and morphology. 3 mm LEFT upper pole, 4 mm LEFT interpolar nephrolithiasis. No hydronephrosis; limited assessment for renal masses on this nonenhanced examination. The unopacified ureters are normal in course and caliber. Urinary bladder is partially distended with circumferential ala moderate urinary bladder wall thickening, no intravesicular calculi.  SOLID ORGANS: The liver, spleen, gallbladder, pancreas are unremarkable for this non-contrast examination. Symmetrically thickened adrenal glands suggests hyperplasia.  GASTROINTESTINAL TRACT: The stomach, small and large bowel are normal in course and caliber without inflammatory changes, the sensitivity may be decreased by lack of enteric contrast.  Normal appendix.  PERITONEUM/RETROPERITONEUM: Aortoiliac vessels are normal in course and caliber, mild calcific atherosclerosis. No lymphadenopathy by CT size criteria. Internal reproductive organs are unremarkable. No intraperitoneal free fluid nor free air. Phleboliths in the pelvis.  SOFT TISSUES/ OSSEOUS STRUCTURES: Asymmetric sclerosis LEFT pubic symphysis is likely degenerative. Scattered chronic Schmorl's nodes. Anterior abdominal wall scarring.  IMPRESSION: Nonobstructing LEFT nephrolithiasis measuring up to 4 mm.  Circumferential urinary bladder wall thickening can be seen with cystitis, recommend correlation with urinary analysis.   Electronically Signed   By: Elon Alas   On: 03/30/2015 22:11     EKG Interpretation   Date/Time:  Saturday March 30 2015 18:50:58 EDT Ventricular Rate:  133 PR Interval:  104 QRS Duration: 70 QT Interval:  386 QTC Calculation: 574 R Axis:   90 Text Interpretation:  Long QTc Sinus tachycardia with short PR Rightward  axis Nonspecific ST abnormality Abnormal ECG Confirmed by Hazle Coca  [phone]) on 03/30/2015 7:18:42 PM     Meds given in ED:  Medications  dextrose 5 %-0.45 % sodium chloride infusion (not administered)  insulin regular (NOVOLIN R,HUMULIN R) 250 Units in sodium chloride 0.9 % 250 mL (1 Units/mL) infusion (not administered)  sodium chloride 0.9 % bolus 2,454 mL (not administered)  sodium chloride 0.9 % bolus 1,000 mL (0 mLs Intravenous Stopped 03/30/15 2228)  morphine 4 MG/ML injection 4 mg (4 mg Intravenous Given 03/30/15 1945)  cefTRIAXone (ROCEPHIN) 1 g in dextrose 5 % 50 mL IVPB (0 g Intravenous Stopped 03/30/15 2228)  sodium chloride 0.9 % bolus 1,000 mL (1,000 mLs Intravenous New Bag/Given 03/30/15 2158)    New Prescriptions   No medications on file   Filed Vitals:   03/30/15 2030 03/30/15 2100 03/30/15 2130 03/30/15 2200  BP: 142/86 121/64 122/67   Pulse: 129 114 129 105  Temp:      TempSrc:      Resp: 19  25 24    Height:      Weight:      SpO2: 99% 97% 98% 96%    MDM  Vitals stable -tachycardia improving in ED -afebrile Pt resting comfortably in ED. pain controlled in ED. States she feels better after  administration of analgesia and IV fluids. PE-patient without CVA tenderness but does have mild suprapubic discomfort with palpation. Otherwise benign abdominal exam. No other lesions or deformities  EKG with prolonged QTC Labwork: No acidosis, gap of 16, glucose 305, hyponatremia of 128, leukocytosis 14.8, evidence of UTI on urinalysis as well as ketones. Creatinine 1.21 Imaging: CT renal stone show nonobstructing LEFT nephrolithiasis measuring up to 4 mm. Circumferential urinary bladder wall thickening can be seen with cystitis, recommend correlation with urinary analysis.  DDX--patient with ketones in urine, low bicarbonate, gap of 16 and type 1 diabetes. Initiated glucose stabilizer. Patient with UTI, pending urine culture--treated clinically for pyelonephritis and received 1 g of Rocephin in ED. Patient also with AKI  Discussed patient presentation and ED course with attending, Dr. Ralene Bathe who also saw and evaluated patient. Agrees to plan for admission for further evaluation of UTI, AKI, Insulin management. Consult to hospitalist, Dr. Maudie Mercury, patient admitted I discussed all relevant lab findings and imaging results with pt and they verbalized understanding.   Final diagnoses:  UTI (lower urinary tract infection)  AKI (acute kidney injury)  Hyperglycemia due to type 1 diabetes mellitus        Comer Locket, PA-C 03/31/15 0145  Quintella Reichert, MD 03/31/15 [phone]

## 2015-03-30 NOTE — ED Notes (Signed)
Pt has been treated for UTI with antibiotics (finished course).  Onset  Yesterday am vomiting x 10 today, nausea, right back pain.  Checked urine at home showed protein.  BS at home 288.

## 2015-03-31 ENCOUNTER — Inpatient Hospital Stay (HOSPITAL_COMMUNITY): Payer: Managed Care, Other (non HMO)

## 2015-03-31 ENCOUNTER — Encounter (HOSPITAL_COMMUNITY): Payer: Self-pay | Admitting: Internal Medicine

## 2015-03-31 DIAGNOSIS — E785 Hyperlipidemia, unspecified: Secondary | ICD-10-CM | POA: Diagnosis present

## 2015-03-31 DIAGNOSIS — E1010 Type 1 diabetes mellitus with ketoacidosis without coma: Principal | ICD-10-CM

## 2015-03-31 DIAGNOSIS — Z79899 Other long term (current) drug therapy: Secondary | ICD-10-CM | POA: Diagnosis not present

## 2015-03-31 DIAGNOSIS — N12 Tubulo-interstitial nephritis, not specified as acute or chronic: Secondary | ICD-10-CM | POA: Diagnosis present

## 2015-03-31 DIAGNOSIS — Z794 Long term (current) use of insulin: Secondary | ICD-10-CM | POA: Diagnosis not present

## 2015-03-31 DIAGNOSIS — F418 Other specified anxiety disorders: Secondary | ICD-10-CM | POA: Diagnosis present

## 2015-03-31 DIAGNOSIS — Z79891 Long term (current) use of opiate analgesic: Secondary | ICD-10-CM | POA: Diagnosis not present

## 2015-03-31 DIAGNOSIS — N390 Urinary tract infection, site not specified: Secondary | ICD-10-CM

## 2015-03-31 DIAGNOSIS — N179 Acute kidney failure, unspecified: Secondary | ICD-10-CM | POA: Diagnosis present

## 2015-03-31 DIAGNOSIS — E1110 Type 2 diabetes mellitus with ketoacidosis without coma: Secondary | ICD-10-CM | POA: Diagnosis present

## 2015-03-31 DIAGNOSIS — E860 Dehydration: Secondary | ICD-10-CM | POA: Diagnosis present

## 2015-03-31 DIAGNOSIS — Z90710 Acquired absence of both cervix and uterus: Secondary | ICD-10-CM | POA: Diagnosis not present

## 2015-03-31 DIAGNOSIS — N200 Calculus of kidney: Secondary | ICD-10-CM | POA: Diagnosis present

## 2015-03-31 DIAGNOSIS — K219 Gastro-esophageal reflux disease without esophagitis: Secondary | ICD-10-CM | POA: Diagnosis present

## 2015-03-31 DIAGNOSIS — F17210 Nicotine dependence, cigarettes, uncomplicated: Secondary | ICD-10-CM | POA: Diagnosis present

## 2015-03-31 LAB — BASIC METABOLIC PANEL
ANION GAP: 10 (ref 5–15)
Anion gap: 12 (ref 5–15)
BUN: 9 mg/dL (ref 6–23)
BUN: 7 mg/dL (ref 6–23)
CALCIUM: 8.1 mg/dL — AB (ref 8.4–10.5)
CALCIUM: 8.4 mg/dL (ref 8.4–10.5)
CO2: 21 mmol/L (ref 19–32)
CO2: 23 mmol/L (ref 19–32)
CREATININE: 0.86 mg/dL (ref 0.50–1.10)
Chloride: 102 mmol/L (ref 96–112)
Chloride: 102 mmol/L (ref 96–112)
Creatinine, Ser: 0.77 mg/dL (ref 0.50–1.10)
GFR calc Af Amer: >90 mL/min (ref >90)
GFR calc non Af Amer: >90 mL/min (ref >90)
GFR, EST NON AFRICAN AMERICAN: 83 mL/min — AB (ref >90)
GLUCOSE: 138 mg/dL — AB (ref 70–99)
GLUCOSE: 79 mg/dL (ref 70–99)
POTASSIUM: 3.5 mmol/L (ref 3.5–5.1)
Potassium: 3.6 mmol/L (ref 3.5–5.1)
SODIUM: 135 mmol/L (ref 135–145)
SODIUM: 135 mmol/L (ref 135–145)

## 2015-03-31 LAB — GLUCOSE, CAPILLARY
GLUCOSE-CAPILLARY: 226 mg/dL — AB (ref 70–99)
GLUCOSE-CAPILLARY: 253 mg/dL — AB (ref 70–99)
Glucose-Capillary: 129 mg/dL — ABNORMAL HIGH (ref 70–99)
Glucose-Capillary: 123 mg/dL — ABNORMAL HIGH (ref 70–99)
Glucose-Capillary: 104 mg/dL — ABNORMAL HIGH (ref 70–99)
Glucose-Capillary: 79 mg/dL (ref 70–99)
Glucose-Capillary: 199 mg/dL — ABNORMAL HIGH (ref 70–99)
Glucose-Capillary: 271 mg/dL — ABNORMAL HIGH (ref 70–99)

## 2015-03-31 LAB — PREGNANCY, URINE: Preg Test, Ur: NEGATIVE

## 2015-03-31 LAB — CBG MONITORING, ED: GLUCOSE-CAPILLARY: 190 mg/dL — AB (ref 70–99)

## 2015-03-31 LAB — INFLUENZA PANEL BY PCR (TYPE A & B)
H1N1FLUPCR: NOT DETECTED
INFLAPCR: NEGATIVE
Influenza B By PCR: NEGATIVE

## 2015-03-31 LAB — PHOSPHORUS: Phosphorus: 1.8 mg/dL — ABNORMAL LOW (ref 2.3–4.6)

## 2015-03-31 LAB — MRSA PCR SCREENING: MRSA BY PCR: NEGATIVE

## 2015-03-31 MED ORDER — INSULIN GLARGINE 100 UNIT/ML ~~LOC~~ SOLN
15.0000 [IU] | Freq: Two times a day (BID) | SUBCUTANEOUS | Status: DC
  Administered 2015-03-31 – 2015-04-02 (×4): 15 [IU] via SUBCUTANEOUS
  Filled 2015-03-31 (×5): qty 0.15

## 2015-03-31 MED ORDER — ONDANSETRON HCL 4 MG/2ML IJ SOLN: 4.0000 mg | Freq: Four times a day (QID) | INTRAMUSCULAR | Status: DC | PRN

## 2015-03-31 MED ORDER — IBUPROFEN 100 MG/5ML PO SUSP
400.0000 mg | Freq: Three times a day (TID) | ORAL | Status: DC
  Filled 2015-03-31 (×3): qty 20

## 2015-03-31 MED ORDER — CETYLPYRIDINIUM CHLORIDE 0.05 % MT LIQD: 7.0000 mL | Freq: Two times a day (BID) | OROMUCOSAL | Status: DC

## 2015-03-31 MED ORDER — ACETAMINOPHEN 325 MG PO TABS
650.0000 mg | ORAL_TABLET | Freq: Four times a day (QID) | ORAL | Status: DC | PRN
Start: 2015-03-31 — End: 2015-04-02
  Administered 2015-03-31 – 2015-04-02 (×5): 650 mg via ORAL
  Filled 2015-03-31 (×4): qty 2

## 2015-03-31 MED ORDER — SODIUM CHLORIDE 0.9 % IV SOLN: INTRAVENOUS | Status: DC

## 2015-03-31 MED ORDER — MORPHINE SULFATE 2 MG/ML IJ SOLN
INTRAMUSCULAR | Status: AC
  Filled 2015-03-31: qty 1

## 2015-03-31 MED ORDER — CHLORHEXIDINE GLUCONATE 0.12 % MT SOLN: 15.0000 mL | Freq: Two times a day (BID) | OROMUCOSAL | Status: DC

## 2015-03-31 MED ORDER — INSULIN ASPART 100 UNIT/ML ~~LOC~~ SOLN
0.0000 [IU] | Freq: Three times a day (TID) | SUBCUTANEOUS | Status: DC
  Administered 2015-03-31: 8 [IU] via SUBCUTANEOUS
  Administered 2015-03-31: 5 [IU] via SUBCUTANEOUS
  Administered 2015-04-01 (×2): 11 [IU] via SUBCUTANEOUS
  Administered 2015-04-01 – 2015-04-02 (×2): 8 [IU] via SUBCUTANEOUS

## 2015-03-31 MED ORDER — DEXTROSE-NACL 5-0.45 % IV SOLN
INTRAVENOUS | Status: DC
  Administered 2015-03-31: 02:00:00 via INTRAVENOUS

## 2015-03-31 MED ORDER — CEFTRIAXONE SODIUM 1 G IJ SOLR
1.0000 g | INTRAMUSCULAR | Status: DC
  Filled 2015-03-31 (×2): qty 10

## 2015-03-31 MED ORDER — MORPHINE SULFATE 2 MG/ML IJ SOLN
2.0000 mg | Freq: Once | INTRAMUSCULAR | Status: AC
  Administered 2015-03-31: 2 mg via INTRAVENOUS
  Filled 2015-03-31: qty 1

## 2015-03-31 MED ORDER — PIPERACILLIN-TAZOBACTAM 3.375 G IVPB
3.3750 g | Freq: Three times a day (TID) | INTRAVENOUS | Status: DC
  Administered 2015-03-31 – 2015-04-02 (×7): 3.375 g via INTRAVENOUS
  Filled 2015-03-31 (×11): qty 50

## 2015-03-31 MED ORDER — ENOXAPARIN SODIUM 30 MG/0.3ML ~~LOC~~ SOLN
30.0000 mg | SUBCUTANEOUS | Status: DC
  Administered 2015-03-31: 30 mg via SUBCUTANEOUS
  Filled 2015-03-31: qty 0.3

## 2015-03-31 MED ORDER — INSULIN ASPART 100 UNIT/ML ~~LOC~~ SOLN
0.0000 [IU] | Freq: Every day | SUBCUTANEOUS | Status: DC
  Administered 2015-03-31 – 2015-04-01 (×2): 3 [IU] via SUBCUTANEOUS

## 2015-03-31 MED ORDER — FLUCONAZOLE 100 MG PO TABS
100.0000 mg | ORAL_TABLET | Freq: Every day | ORAL | Status: AC
Start: 2015-03-31 — End: 2015-04-02
  Administered 2015-03-31 – 2015-04-02 (×3): 100 mg via ORAL
  Filled 2015-03-31 (×3): qty 1

## 2015-03-31 MED ORDER — POTASSIUM CHLORIDE 10 MEQ/100ML IV SOLN
10.0000 meq | INTRAVENOUS | Status: AC
Start: 2015-03-31 — End: 2015-03-31
  Administered 2015-03-31 (×2): 10 meq via INTRAVENOUS
  Filled 2015-03-31: qty 100

## 2015-03-31 MED ORDER — ALPRAZOLAM 0.25 MG PO TABS: 0.2500 mg | ORAL_TABLET | Freq: Every day | ORAL | Status: DC | PRN

## 2015-03-31 MED ORDER — ENOXAPARIN SODIUM 40 MG/0.4ML ~~LOC~~ SOLN
40.0000 mg | SUBCUTANEOUS | Status: DC
  Administered 2015-04-01 – 2015-04-02 (×2): 40 mg via SUBCUTANEOUS
  Filled 2015-03-31 (×3): qty 0.4

## 2015-03-31 MED ORDER — IBUPROFEN 100 MG/5ML PO SUSP
400.0000 mg | Freq: Three times a day (TID) | ORAL | Status: DC | PRN
  Administered 2015-03-31 – 2015-04-01 (×2): 400 mg via ORAL
  Filled 2015-03-31 (×3): qty 20

## 2015-03-31 MED ORDER — MORPHINE SULFATE 2 MG/ML IJ SOLN
2.0000 mg | INTRAMUSCULAR | Status: DC | PRN
  Administered 2015-03-31 – 2015-04-01 (×5): 2 mg via INTRAVENOUS
  Filled 2015-03-31 (×4): qty 1

## 2015-03-31 MED ORDER — SODIUM CHLORIDE 0.9 % IV SOLN: INTRAVENOUS | Status: AC

## 2015-03-31 MED ORDER — INSULIN GLARGINE 100 UNIT/ML ~~LOC~~ SOLN
15.0000 [IU] | Freq: Every day | SUBCUTANEOUS | Status: DC
  Filled 2015-03-31: qty 0.15

## 2015-03-31 NOTE — ED Notes (Signed)
Verbal order given by Dr. Maudie Mercury to continue with insulin drip.

## 2015-03-31 NOTE — Progress Notes (Addendum)
Patient seen and examined Febrile last night Metabolic acidosis resolved, DKA resolved Patient transition to Lantus and sliding scale insulin Urine culture pending Will draw blood culture, and change Rocephin to Zosyn for broader coverage pending culture results Transfer patient to [facility]

## 2015-03-31 NOTE — H&P (Signed)
Sheryl White is an 39 y.o. female.    Dr. Reynaldo Minium (pcp)  Chief Complaint: uti HPI: 39 yo female with dm, hx of dka, apparently was tx for uti 2 weeks ago with keflex, and then apparently starting 1 week ago developed flank pain.  Pt has had n/v, as well as flank pain recently.  Subjective fever.  Slight abdominal discomfort.  Pt checked herself and was ketone positive and therefore presented to the ED for evaluation and was noted to be tachycardic and in DKA and to have uti.   Past Medical History  Diagnosis Date  . Endometriosis   . Ovarian cyst   . Diabetes mellitus   . Angina   . GERD (gastroesophageal reflux disease)   . Anxiety   . Depression   . Dyslipidemia     Past Surgical History  Procedure Laterality Date  . Laparoscopic salpingoopherectomy    . Endometrial ablation    . Wisdom tooth extraction    . Tubal ligation    . Carpal tunnel release      bilateral  . Colonoscopy    . Upper gastrointestinal endoscopy    . Svd      x 1  . Cesarean section      x 1  . Abdominal hysterectomy      still have left ovary  . Trigger finger release      bilateral    Family History  Problem Relation Age of Onset  . Colon polyps Father   . Diabetes Son   . Diabetes Sister   . Cancer Father     ?   Social History:  reports that she has been smoking Cigarettes.  She has a 5 pack-year smoking history. She has never used smokeless tobacco. She reports that she drinks alcohol. She reports that she does not use illicit drugs.  Allergies: No Known Allergies   (Not in a hospital admission)  Results for orders placed or performed during the hospital encounter of 03/30/15 (from the past 48 hour(s))  CBG monitoring, ED     Status: Abnormal   Collection Time: 03/30/15  6:54 PM  Result Value Ref Range   Glucose-Capillary 285 (H) 70 - 99 mg/dL  CBC with Differential     Status: Abnormal   Collection Time: 03/30/15  7:30 PM  Result Value Ref Range   WBC 14.8 (H) 4.0 - 10.5  K/uL   RBC 4.66 3.87 - 5.11 MIL/uL   Hemoglobin 14.5 12.0 - 15.0 g/dL   HCT 41.3 36.0 - 46.0 %   MCV 88.6 78.0 - 100.0 fL   MCH 31.1 26.0 - 34.0 pg   MCHC 35.1 30.0 - 36.0 g/dL   RDW 12.1 11.5 - 15.5 %   Platelets 272 150 - 400 K/uL   Neutrophils Relative % 87 (H) 43 - 77 %   Neutro Abs 13.0 (H) 1.7 - 7.7 K/uL   Lymphocytes Relative 5 (L) 12 - 46 %   Lymphs Abs 0.7 0.7 - 4.0 K/uL   Monocytes Relative 8 3 - 12 %   Monocytes Absolute 1.1 (H) 0.1 - 1.0 K/uL   Eosinophils Relative 0 0 - 5 %   Eosinophils Absolute 0.0 0.0 - 0.7 K/uL   Basophils Relative 0 0 - 1 %   Basophils Absolute 0.0 0.0 - 0.1 K/uL  Urinalysis, Routine w reflex microscopic     Status: Abnormal   Collection Time: 03/30/15  7:30 PM  Result Value Ref Range  Color, Urine YELLOW YELLOW   APPearance CLEAR CLEAR   Specific Gravity, Urine 1.024 1.005 - 1.030   pH 5.5 5.0 - 8.0   Glucose, UA >1000 (A) NEGATIVE mg/dL   Hgb urine dipstick MODERATE (A) NEGATIVE   Bilirubin Urine MODERATE (A) NEGATIVE   Ketones, ur >80 (A) NEGATIVE mg/dL   Protein, ur 30 (A) NEGATIVE mg/dL   Urobilinogen, UA 0.2 0.0 - 1.0 mg/dL   Nitrite NEGATIVE NEGATIVE   Leukocytes, UA NEGATIVE NEGATIVE  Urine microscopic-add on     Status: None   Collection Time: 03/30/15  7:30 PM  Result Value Ref Range   Squamous Epithelial / LPF RARE RARE   WBC, UA 11-20 <3 WBC/hpf   RBC / HPF 0-2 <3 RBC/hpf   Bacteria, UA RARE RARE  Magnesium     Status: None   Collection Time: 03/30/15 10:20 PM  Result Value Ref Range   Magnesium 1.7 1.5 - 2.5 mg/dL  Basic metabolic panel     Status: Abnormal   Collection Time: 03/30/15 10:20 PM  Result Value Ref Range   Sodium 128 (L) 135 - 145 mmol/L   Potassium 4.2 3.5 - 5.1 mmol/L   Chloride 96 96 - 112 mmol/L   CO2 16 (L) 19 - 32 mmol/L   Glucose, Bld 305 (H) 70 - 99 mg/dL   BUN 14 6 - 23 mg/dL   Creatinine, Ser 1.21 (H) 0.50 - 1.10 mg/dL   Calcium 8.5 8.4 - 10.5 mg/dL   GFR calc non Af Amer 55 (L) >90 mL/min    GFR calc Af Amer 64 (L) >90 mL/min    Comment: (NOTE) The eGFR has been calculated using the CKD EPI equation. This calculation has not been validated in all clinical situations. eGFR's persistently <90 mL/min signify possible Chronic Kidney Disease.    Anion gap 16 (H) 5 - 15  I-Stat venous blood gas, ED     Status: Abnormal   Collection Time: 03/30/15 10:24 PM  Result Value Ref Range   pH, Ven 7.382 (H) 7.250 - 7.300   pCO2, Ven 31.5 (L) 45.0 - 50.0 mmHg   pO2, Ven 37.0 30.0 - 45.0 mmHg   Bicarbonate 18.7 (L) 20.0 - 24.0 mEq/L   TCO2 20 0 - 100 mmol/L   O2 Saturation 71.0 %   Acid-base deficit 5.0 (H) 0.0 - 2.0 mmol/L   Sample type VENOUS    Comment NOTIFIED PHYSICIAN   CBG monitoring, ED     Status: Abnormal   Collection Time: 03/31/15 12:01 AM  Result Value Ref Range   Glucose-Capillary 190 (H) 70 - 99 mg/dL   Ct Renal Stone Study  03/30/2015   CLINICAL DATA:  Proteinuria, low energy for 1 week. On on antibiotics for urinary tract infection. Flank pain rating to LEFT lower quadrant with fevers and chills for 5 days. Vomiting. History of endometriosis, diabetes, ovarian cyst.  EXAM: CT ABDOMEN AND PELVIS WITHOUT CONTRAST  TECHNIQUE: Multidetector CT imaging of the abdomen and pelvis was performed following the standard protocol without IV contrast.  COMPARISON:  None.  FINDINGS: LUNG BASES: Included view of the lung bases are clear. The visualized heart and pericardium are unremarkable.  KIDNEYS/BLADDER: Kidneys are orthotopic, demonstrating normal size and morphology. 3 mm LEFT upper pole, 4 mm LEFT interpolar nephrolithiasis. No hydronephrosis; limited assessment for renal masses on this nonenhanced examination. The unopacified ureters are normal in course and caliber. Urinary bladder is partially distended with circumferential ala moderate urinary bladder wall thickening,  no intravesicular calculi.  SOLID ORGANS: The liver, spleen, gallbladder, pancreas are unremarkable for this  non-contrast examination. Symmetrically thickened adrenal glands suggests hyperplasia.  GASTROINTESTINAL TRACT: The stomach, small and large bowel are normal in course and caliber without inflammatory changes, the sensitivity may be decreased by lack of enteric contrast. Normal appendix.  PERITONEUM/RETROPERITONEUM: Aortoiliac vessels are normal in course and caliber, mild calcific atherosclerosis. No lymphadenopathy by CT size criteria. Internal reproductive organs are unremarkable. No intraperitoneal free fluid nor free air. Phleboliths in the pelvis.  SOFT TISSUES/ OSSEOUS STRUCTURES: Asymmetric sclerosis LEFT pubic symphysis is likely degenerative. Scattered chronic Schmorl's nodes. Anterior abdominal wall scarring.  IMPRESSION: Nonobstructing LEFT nephrolithiasis measuring up to 4 mm.  Circumferential urinary bladder wall thickening can be seen with cystitis, recommend correlation with urinary analysis.   Electronically Signed   By: Elon Alas   On: 03/30/2015 22:11    Review of Systems  Constitutional: Positive for fever. Negative for chills, weight loss, malaise/fatigue and diaphoresis.  HENT: Negative.   Eyes: Negative.   Respiratory: Negative.   Cardiovascular: Negative.   Gastrointestinal: Positive for nausea, vomiting and abdominal pain. Negative for heartburn, diarrhea, constipation, blood in stool and melena.  Genitourinary: Positive for flank pain. Negative for dysuria, urgency, frequency and hematuria.  Musculoskeletal: Negative.   Skin: Negative.   Neurological: Negative.  Negative for weakness.  Endo/Heme/Allergies: Negative.   Psychiatric/Behavioral: Negative.     Blood pressure 132/71, pulse 108, temperature 99.7 F (37.6 C), temperature source Oral, resp. rate 23, height _0  (1.676 m), weight 71.215 kg (157 lb), SpO2 96 %. Physical Exam  Constitutional: She is oriented to person, place, and time. She appears well-developed and well-nourished.  HENT:  Head:  Normocephalic and atraumatic.  Mouth/Throat: No oropharyngeal exudate.  Eyes: Conjunctivae and EOM are normal. Pupils are equal, round, and reactive to light. No scleral icterus.  Neck: Normal range of motion. Neck supple. No JVD present. No tracheal deviation present. No thyromegaly present.  Cardiovascular: Regular rhythm.  Exam reveals no gallop and no friction rub.   No murmur heard. Tachycardic s1, s2  Respiratory: Effort normal and breath sounds normal. No respiratory distress. She has no wheezes. She has no rales.  GI: Soft. Bowel sounds are normal. She exhibits no distension. There is no tenderness. There is no rebound and no guarding.  Musculoskeletal: Normal range of motion. She exhibits no edema or tenderness.  Lymphadenopathy:    She has no cervical adenopathy.  Neurological: She is alert and oriented to person, place, and time. She has normal reflexes. She displays normal reflexes. No cranial nerve deficit. She exhibits normal muscle tone. Coordination normal.  Skin: Skin is warm and dry. No rash noted. No erythema. No pallor.  Psychiatric: She has a normal mood and affect. Her behavior is normal. Judgment and thought content normal.     Assessment/Plan DKA Npo Iv insulin Iv ns=> d5 ns after bs <250 Check bmp q4h  Mild ARF Hydration with ns  Uti Await culture tx with rocephin 1gm iv qday  Leukocytosis secondary to uti Check cbc in am   DVT prophylaxis :  Scd, lovenox  Jani Gravel 03/31/2015, 12:29 AM

## 2015-03-31 NOTE — Progress Notes (Signed)
Utilization Review Completed.Donne Anon T4/17/2016

## 2015-03-31 NOTE — Progress Notes (Addendum)
ANTIBIOTIC CONSULT NOTE - INITIAL  Pharmacy Consult for Zosyn Indication: rule out sepsis  No Known Allergies  Patient Measurements: Height: 5\' 6"  (167.6 cm) Weight: 143 lb 14.4 oz (65.273 kg) IBW/kg (Calculated) : 59.3 Adjusted Body Weight: n/a   Labs:  Recent Labs  03/30/15 1930 03/30/15 2220 03/31/15 0227 03/31/15 0532  WBC 14.8*  --   --   --   HGB 14.5  --   --   --   PLT 272  --   --   --   CREATININE  --  1.21* 0.86 0.77   Estimated Creatinine Clearance: 87.5 mL/min (by C-G formula based on Cr of 0.77). No results for input(s): VANCOTROUGH, VANCOPEAK, VANCORANDOM, GENTTROUGH, GENTPEAK, GENTRANDOM, TOBRATROUGH, TOBRAPEAK, TOBRARND, AMIKACINPEAK, AMIKACINTROU, AMIKACIN in the last 72 hours.   Microbiology: Recent Results (from the past 720 hour(s))  MRSA PCR Screening     Status: None   Collection Time: 03/31/15  1:43 AM  Result Value Ref Range Status   MRSA by PCR NEGATIVE NEGATIVE Final    Comment:        The GeneXpert MRSA Assay (FDA approved for NASAL specimens only), is one component of a comprehensive MRSA colonization surveillance program. It is not intended to diagnose MRSA infection nor to guide or monitor treatment for MRSA infections.     Medical History: Past Medical History  Diagnosis Date  . Endometriosis   . Ovarian cyst   . Diabetes mellitus   . Angina   . GERD (gastroesophageal reflux disease)   . Anxiety   . Depression   . Dyslipidemia     Assessment: 39 yo female admitted with UTI.  PMH significant for DM, hx DKA.  Was treated for UTI 2 weeks ago with Keflex, then developed flank pain 1 week ago as well as nausea/vomiting.  Now admitted with UTI and DKA.  Pharmacy asked to begin empiric Zosyn for sepsis.  4/16 BCx x 2 > pending 4/16 UCx > pending  Goal of Therapy:  Resolution of infection  Plan:  1. Zosyn 3.375g IV q 8 hrs. 2. Watch renal function, clinical course and cultures.  Uvaldo Rising, BCPS  Clinical  Pharmacist Pager [phone]  03/31/2015 8:35 AM    =======================   Addendum: - renal fxn stable - pharmacy will sign off as dosage adjustment likely unnecessary.  Thank you for the consult!   Emanii Bugbee D. Mina Marble, PharmD, BCPS Pager:  [phone] 04/01/2015, 8:09 AM

## 2015-04-01 ENCOUNTER — Encounter (HOSPITAL_COMMUNITY): Payer: Self-pay | Admitting: Radiology

## 2015-04-01 ENCOUNTER — Inpatient Hospital Stay (HOSPITAL_COMMUNITY): Payer: Managed Care, Other (non HMO)

## 2015-04-01 LAB — COMPREHENSIVE METABOLIC PANEL
ALBUMIN: 2.4 g/dL — AB (ref 3.5–5.2)
ALT: 31 U/L (ref 0–35)
AST: 54 U/L — ABNORMAL HIGH (ref 0–37)
Alkaline Phosphatase: 99 U/L (ref 39–117)
Anion gap: 11 (ref 5–15)
BUN: 7 mg/dL (ref 6–23)
CO2: 22 mmol/L (ref 19–32)
CREATININE: 0.77 mg/dL (ref 0.50–1.10)
Calcium: 8.1 mg/dL — ABNORMAL LOW (ref 8.4–10.5)
Chloride: 96 mmol/L (ref 96–112)
GFR calc Af Amer: >90 mL/min (ref >90)
GFR calc non Af Amer: >90 mL/min (ref >90)
Glucose, Bld: 223 mg/dL — ABNORMAL HIGH (ref 70–99)
Potassium: 3.8 mmol/L (ref 3.5–5.1)
Sodium: 129 mmol/L — ABNORMAL LOW (ref 135–145)
Total Bilirubin: 0.9 mg/dL (ref 0.3–1.2)
Total Protein: 6.4 g/dL (ref 6.0–8.3)

## 2015-04-01 LAB — GLUCOSE, CAPILLARY
GLUCOSE-CAPILLARY: 252 mg/dL — AB (ref 70–99)
GLUCOSE-CAPILLARY: 322 mg/dL — AB (ref 70–99)
Glucose-Capillary: 340 mg/dL — ABNORMAL HIGH (ref 70–99)
Glucose-Capillary: 293 mg/dL — ABNORMAL HIGH (ref 70–99)

## 2015-04-01 LAB — CBC
HCT: 38.2 % (ref 36.0–46.0)
Hemoglobin: 13 g/dL (ref 12.0–15.0)
MCH: 30.1 pg (ref 26.0–34.0)
MCHC: 34 g/dL (ref 30.0–36.0)
MCV: 88.4 fL (ref 78.0–100.0)
PLATELETS: 206 10*3/uL (ref 150–400)
RBC: 4.32 MIL/uL (ref 3.87–5.11)
RDW: 12 % (ref 11.5–15.5)
WBC: 7.2 10*3/uL (ref 4.0–10.5)

## 2015-04-01 MED ORDER — IOHEXOL 300 MG/ML  SOLN: 25.0000 mL | INTRAMUSCULAR | Status: AC

## 2015-04-01 MED ORDER — IOHEXOL 300 MG/ML  SOLN
80.0000 mL | Freq: Once | INTRAMUSCULAR | Status: AC | PRN
  Administered 2015-04-01: 80 mL via INTRAVENOUS

## 2015-04-01 NOTE — Progress Notes (Signed)
Orthostatic vitals: Lying - 101/69, pulse 87 Sitting - 108/66, pulse 78 Standing - 105/69, pulse 100

## 2015-04-01 NOTE — Progress Notes (Addendum)
[facility] PROGRESS NOTE  Sheryl White VZC:[id number] DOB: Oct 25, 1976 DOA: 03/30/2015 PCP: Geoffery Lyons, MD  Assessment/Plan: Active Problems:   UTI (lower urinary tract infection)   DKA (diabetic ketoacidoses)    Diabetic ketoacidosis Resolved CBG stable  UTI/follow-up/pyelonephritis  Repeat CT scan today with contrast suspicious for pyeloneprhitis, cystitis, no abcess  Continue Zosyn   Dehydration continue with IV fluids  Code Status: full Family Communication: family updated about patient's clinical progress Disposition Plan:  As above    Brief narrative: 39 yo female with dm, hx of dka, apparently was tx for uti 2 weeks ago with keflex, and then apparently starting 1 week ago developed flank pain. Pt has had n/v, as well as flank pain recently. Subjective fever. Slight abdominal discomfort. Pt checked herself and was ketone positive and therefore presented to the ED for evaluation and was noted to be tachycardic and in DKA and to have uti.   Consultants:  None  Procedures:  None  Antibiotics: Zosyn  HPI/Subjective: Patient continues to be febrile, high-grade fever last night, no flank pain, no nausea  Objective: Filed Vitals:   03/31/15 2123 04/01/15 0230 04/01/15 0549 04/01/15 1011  BP: 105/59 118/75 102/62 105/65  Pulse: 84 106 83 83  Temp: 98 F (36.7 C) 102.7 F (39.3 C) 98.1 F (36.7 C) 97.6 F (36.4 C)  TempSrc: Oral Oral Oral Oral  Resp: 18 17 17 16   Height:      Weight:      SpO2: 98% 96% 98% 98%    Intake/Output Summary (Last 24 hours) at 04/01/15 1041 Last data filed at 04/01/15 1000  Gross per 24 hour  Intake    480 ml  Output   1850 ml  Net  -1370 ml    Exam:  General: No acute respiratory distress Lungs: Clear to auscultation bilaterally without wheezes or crackles Cardiovascular: Regular rate and rhythm without murmur gallop or rub normal S1 and S2 Abdomen: Nontender, nondistended, soft, bowel sounds  positive, no rebound, no ascites, no appreciable mass Extremities: No significant cyanosis, clubbing, or edema bilateral lower extremities      Data Reviewed: Basic Metabolic Panel:  Recent Labs Lab 03/30/15 2220 03/31/15 0227 03/31/15 0532 04/01/15 0410  NA 128* 135 135 129*  K 4.2 3.5 3.6 3.8  CL 96 102 102 96  CO2 16* 21 23 22   GLUCOSE 305* 138* 79 223*  BUN 14 9 7 7   CREATININE 1.21* 0.86 0.77 0.77  CALCIUM 8.5 8.1* 8.4 8.1*  MG 1.7  --   --   --   PHOS  --  1.8*  --   --     Liver Function Tests:  Recent Labs Lab 04/01/15 0410  AST 54*  ALT 31  ALKPHOS 99  BILITOT 0.9  PROT 6.4  ALBUMIN 2.4*   No results for input(s): LIPASE, AMYLASE in the last 168 hours. No results for input(s): AMMONIA in the last 168 hours.  CBC:  Recent Labs Lab 03/30/15 1930 04/01/15 0410  WBC 14.8* 7.2  NEUTROABS 13.0*  --   HGB 14.5 13.0  HCT 41.3 38.2  MCV 88.6 88.4  PLT 272 206    Cardiac Enzymes: No results for input(s): CKTOTAL, CKMB, CKMBINDEX, TROPONINI in the last 168 hours. BNP (last 3 results) No results for input(s): BNP in the last 8760 hours.  ProBNP (last 3 results) No results for input(s): PROBNP in the last 8760 hours.    CBG:  Recent Labs Lab 03/31/15 1100 03/31/15  1417 03/31/15 1706 03/31/15 2122 04/01/15 0800  GLUCAP 199* 226* 271* 253* 252*    Recent Results (from the past 240 hour(s))  MRSA PCR Screening     Status: None   Collection Time: 03/31/15  1:43 AM  Result Value Ref Range Status   MRSA by PCR NEGATIVE NEGATIVE Final    Comment:        The GeneXpert MRSA Assay (FDA approved for NASAL specimens only), is one component of a comprehensive MRSA colonization surveillance program. It is not intended to diagnose MRSA infection nor to guide or monitor treatment for MRSA infections.   Culture, blood (routine x 2)     Status: None (Preliminary result)   Collection Time: 03/31/15 10:19 AM  Result Value Ref Range Status    Specimen Description BLOOD LEFT ANTECUBITAL  Final   Special Requests BOTTLES DRAWN AEROBIC AND ANAEROBIC 10CC  Final   Culture   Final           BLOOD CULTURE RECEIVED NO GROWTH TO DATE CULTURE WILL BE HELD FOR 5 DAYS BEFORE ISSUING A FINAL NEGATIVE REPORT Performed at [facility]    Report Status PENDING  Incomplete  Culture, blood (routine x 2)     Status: None (Preliminary result)   Collection Time: 03/31/15 10:26 AM  Result Value Ref Range Status   Specimen Description BLOOD RIGHT FOREARM  Final   Special Requests BOTTLES DRAWN AEROBIC ONLY 2CC  Final   Culture   Final           BLOOD CULTURE RECEIVED NO GROWTH TO DATE CULTURE WILL BE HELD FOR 5 DAYS BEFORE ISSUING A FINAL NEGATIVE REPORT Performed at [facility]    Report Status PENDING  Incomplete     Studies: Dg Chest 2 View  03/31/2015   CLINICAL DATA:  Fever  EXAM: CHEST  2 VIEW  COMPARISON:  03/31/2015  FINDINGS: Cardiac shadow is within normal limits. The lungs are well aerated bilaterally. Very minimal interstitial changes are seen without focal confluent infiltrate. The overall appearance is similar to that seen on the prior exam. The bony structures are within normal limits.  IMPRESSION: Stable appearance from the prior exam.   Electronically Signed   By: Inez Catalina M.D.   On: 03/31/2015 17:12   Dg Chest 2 View  03/31/2015   CLINICAL DATA:  Difficulty breathing for 2 days.  EXAM: CHEST  2 VIEW  COMPARISON:  10/29/2013  FINDINGS: The cardiac silhouette, mediastinal and hilar contours are within normal limits and stable. The lungs demonstrate mild bronchitic changes with peribronchial thickening and increased interstitial markings. This could reflect bronchitis or interstitial pneumonitis. No focal airspace consolidation or pleural effusion. The bony thorax is intact.  IMPRESSION: Findings suggest bronchitis or interstitial pneumonitis but no airspace consolidation or effusion.   Electronically Signed   By: Marijo Sanes M.D.   On: 03/31/2015 07:50   Ct Renal Stone Study  03/30/2015   CLINICAL DATA:  Proteinuria, low energy for 1 week. On on antibiotics for urinary tract infection. Flank pain rating to LEFT lower quadrant with fevers and chills for 5 days. Vomiting. History of endometriosis, diabetes, ovarian cyst.  EXAM: CT ABDOMEN AND PELVIS WITHOUT CONTRAST  TECHNIQUE: Multidetector CT imaging of the abdomen and pelvis was performed following the standard protocol without IV contrast.  COMPARISON:  None.  FINDINGS: LUNG BASES: Included view of the lung bases are clear. The visualized heart and pericardium are unremarkable.  KIDNEYS/BLADDER: Kidneys are  orthotopic, demonstrating normal size and morphology. 3 mm LEFT upper pole, 4 mm LEFT interpolar nephrolithiasis. No hydronephrosis; limited assessment for renal masses on this nonenhanced examination. The unopacified ureters are normal in course and caliber. Urinary bladder is partially distended with circumferential ala moderate urinary bladder wall thickening, no intravesicular calculi.  SOLID ORGANS: The liver, spleen, gallbladder, pancreas are unremarkable for this non-contrast examination. Symmetrically thickened adrenal glands suggests hyperplasia.  GASTROINTESTINAL TRACT: The stomach, small and large bowel are normal in course and caliber without inflammatory changes, the sensitivity may be decreased by lack of enteric contrast. Normal appendix.  PERITONEUM/RETROPERITONEUM: Aortoiliac vessels are normal in course and caliber, mild calcific atherosclerosis. No lymphadenopathy by CT size criteria. Internal reproductive organs are unremarkable. No intraperitoneal free fluid nor free air. Phleboliths in the pelvis.  SOFT TISSUES/ OSSEOUS STRUCTURES: Asymmetric sclerosis LEFT pubic symphysis is likely degenerative. Scattered chronic Schmorl's nodes. Anterior abdominal wall scarring.  IMPRESSION: Nonobstructing LEFT nephrolithiasis measuring up to 4 mm.   Circumferential urinary bladder wall thickening can be seen with cystitis, recommend correlation with urinary analysis.   Electronically Signed   By: Elon Alas   On: 03/30/2015 22:11    Scheduled Meds: . enoxaparin (LOVENOX) injection  40 mg Subcutaneous Q24H  . fluconazole  100 mg Oral Daily  . insulin aspart  0-15 Units Subcutaneous TID WC  . insulin aspart  0-5 Units Subcutaneous QHS  . insulin glargine  15 Units Subcutaneous BID  . iohexol  25 mL Oral Q1 Hr x 2  . piperacillin-tazobactam (ZOSYN)  IV  3.375 g Intravenous 3 times per day   Continuous Infusions: . sodium chloride    . dextrose 5 % and 0.45% NaCl Stopped (03/31/15 8832)    Active Problems:   UTI (lower urinary tract infection)   DKA (diabetic ketoacidoses)    Time spent: 40 minutes   [facility] Pager [phone]. If 7PM-7AM, please contact night-coverage at [email or web address], password [facility] 04/01/2015, 10:41 AM  LOS: 1 day

## 2015-04-01 NOTE — Progress Notes (Signed)
Inpatient Diabetes Program Recommendations  AACE/ADA: New Consensus Statement on Inpatient Glycemic Control (2013)  Target Ranges:  Prepandial:   less than 140 mg/dL      Peak postprandial:   less than 180 mg/dL (1-2 hours)      Critically ill patients:  140 - 180 mg/dL   Results for SHANTICE, MENGER (MRN [id number]) as of 04/01/2015 13:49  Ref. Range 03/31/2015 11:00 03/31/2015 14:17 03/31/2015 16:55 03/31/2015 17:06 03/31/2015 21:22 04/01/2015 04:10 04/01/2015 08:00 04/01/2015 10:47 04/01/2015 11:54  Glucose-Capillary Latest Ref Range: 70-99 mg/dL 199 (H) 226 (H)  271 (H) 253 (H)  252 (H)  322 (H)   Inpatient Diabetes Program Recommendations Insulin - Basal: Pt is noted to state he takes 30 units  lantus bid. Would recommend an increase in lantus to 20 units bid and if fastings remain elevated, increae again to 25 units bid.  Thank you Rosita Kea, RN, MSN, CDE  Diabetes Inpatient Program Office: [phone] Pager: [phone] 8:00 am to 5:00 pm

## 2015-04-02 LAB — URINE CULTURE: Colony Count: 75000

## 2015-04-02 LAB — COMPREHENSIVE METABOLIC PANEL
ALT: 33 U/L (ref 0–35)
AST: 46 U/L — AB (ref 0–37)
Albumin: 2.3 g/dL — ABNORMAL LOW (ref 3.5–5.2)
Alkaline Phosphatase: 107 U/L (ref 39–117)
Anion gap: 14 (ref 5–15)
BILIRUBIN TOTAL: 1.4 mg/dL — AB (ref 0.3–1.2)
BUN: 9 mg/dL (ref 6–23)
CHLORIDE: 98 mmol/L (ref 96–112)
CO2: 23 mmol/L (ref 19–32)
Calcium: 8.2 mg/dL — ABNORMAL LOW (ref 8.4–10.5)
Creatinine, Ser: 0.84 mg/dL (ref 0.50–1.10)
GFR calc Af Amer: >90 mL/min (ref >90)
GFR calc non Af Amer: 86 mL/min — ABNORMAL LOW (ref >90)
Glucose, Bld: 300 mg/dL — ABNORMAL HIGH (ref 70–99)
Potassium: 4.5 mmol/L (ref 3.5–5.1)
SODIUM: 135 mmol/L (ref 135–145)
Total Protein: 5.8 g/dL — ABNORMAL LOW (ref 6.0–8.3)

## 2015-04-02 LAB — CBC
HEMATOCRIT: 35.4 % — AB (ref 36.0–46.0)
HEMOGLOBIN: 12.2 g/dL (ref 12.0–15.0)
MCH: 30.7 pg (ref 26.0–34.0)
MCHC: 34.5 g/dL (ref 30.0–36.0)
MCV: 89.2 fL (ref 78.0–100.0)
Platelets: 207 10*3/uL (ref 150–400)
RBC: 3.97 MIL/uL (ref 3.87–5.11)
RDW: 12.2 % (ref 11.5–15.5)
WBC: 5.8 10*3/uL (ref 4.0–10.5)

## 2015-04-02 LAB — GLUCOSE, CAPILLARY: GLUCOSE-CAPILLARY: 292 mg/dL — AB (ref 70–99)

## 2015-04-02 MED ORDER — INSULIN GLARGINE 100 UNIT/ML ~~LOC~~ SOLN
15.0000 [IU] | Freq: Two times a day (BID) | SUBCUTANEOUS | Status: DC
  Administered 2015-04-02: 15 [IU] via SUBCUTANEOUS
  Filled 2015-04-02 (×2): qty 0.15

## 2015-04-02 MED ORDER — HYDROCODONE-ACETAMINOPHEN 5-325 MG PO TABS: 2.0000 | ORAL_TABLET | Freq: Four times a day (QID) | ORAL | Status: DC | PRN

## 2015-04-02 MED ORDER — CIPROFLOXACIN HCL 500 MG PO TABS: 500.0000 mg | ORAL_TABLET | Freq: Two times a day (BID) | ORAL | Status: DC

## 2015-04-02 MED ORDER — INSULIN GLARGINE 100 UNIT/ML ~~LOC~~ SOLN
30.0000 [IU] | Freq: Two times a day (BID) | SUBCUTANEOUS | Status: DC
  Filled 2015-04-02 (×2): qty 0.3

## 2015-04-02 MED ORDER — FLUCONAZOLE 100 MG PO TABS: 100.0000 mg | ORAL_TABLET | Freq: Every day | ORAL | Status: AC

## 2015-04-02 NOTE — Discharge Summary (Signed)
Physician Discharge Summary  Sheryl White MRN: [id number] DOB/AGE: 1976/09/08 39 y.o.  PCP: Geoffery Lyons, MD   Admit date: 03/30/2015 Discharge date: 04/02/2015  Discharge Diagnoses:    Active Problems:   UTI (lower urinary tract infection)   DKA (diabetic ketoacidoses)  Follow-up recommendations  follow-up with PCP in 3-5 days Follow-up CBC, BMP in one week    Medication List    STOP taking these medications        cephALEXin 500 MG capsule  Commonly known as:  KEFLEX      TAKE these medications        acetaminophen 500 MG tablet  Commonly known as:  TYLENOL  Take 1 tablet (500 mg total) by mouth every 6 (six) hours as needed for pain.     ALPRAZolam 0.25 MG tablet  Commonly known as:  XANAX  Take 0.25 mg by mouth daily as needed (panic attacks).     ciprofloxacin 500 MG tablet  Commonly known as:  CIPRO  Take 1 tablet (500 mg total) by mouth 2 (two) times daily.     HYDROcodone-acetaminophen 5-325 MG per tablet  Commonly known as:  NORCO/VICODIN  Take 2 tablets by mouth every 6 (six) hours as needed.     insulin aspart 100 UNIT/ML injection  Commonly known as:  novoLOG  Inject 3-12 Units into the skin See admin instructions. Inject 3-12 units per sliding scale subcutaneously 3-4 times daily with meals     insulin glargine 100 UNIT/ML injection  Commonly known as:  LANTUS  Inject 0.2 mLs (20 Units total) into the skin 2 (two) times daily.     insulin lispro 100 UNIT/ML injection  Commonly known as:  HUMALOG  Inject 4-15 Units into the skin 6 (six) times daily. Per sliding scale        Discharge Condition: Stable   Disposition: 01-Home or Self Care   Consults:  None *   Significant Diagnostic Studies: Ct Abdomen Pelvis W Wo Contrast  04/01/2015   CLINICAL DATA:  Low back/stomach pain, vomiting, on antibiotics for UTI  EXAM: CT ABDOMEN AND PELVIS WITHOUT AND WITH CONTRAST  TECHNIQUE: Multidetector CT imaging of the abdomen and  pelvis was performed following the standard protocol before and following the bolus administration of intravenous contrast.  CONTRAST:  42m OMNIPAQUE IOHEXOL 300 MG/ML  SOLN  COMPARISON:  03/30/2015  FINDINGS: Motion degraded images.  Lower chest:  Lung bases are clear.  Hepatobiliary: Liver is within normal limits.  Gallbladder is underdistended but unremarkable. No intrahepatic or extrahepatic ductal dilatation.  Pancreas: Within normal limits.  Spleen: Within normal limits.  Adrenals/Urinary Tract: Adrenal glands are within normal limits.  4 mm nonobstructing interpolar left renal calculus (series 2/ image 36). 3 mm nonobstructing left upper pole renal calculus (series 2/ image 27).  Heterogeneous perfusion of the anterior right upper kidney (series 7/ image 31). Although motion degraded, this appearance does not suggest a well-defined renal abscess. Suspected mild urothelial thickening/enhancement of the right renal collecting system (series 7/ image 39).  No hydronephrosis.  Mildly thick walled bladder, suspicious for cystitis.  Stomach/Bowel: Stomach is within normal limits.  No evidence of bowel obstruction.  Appendix is not discretely visualized  Vascular/Lymphatic: No evidence of abdominal aortic aneurysm.  Small retroperitoneal lymph nodes measuring up to 8 mm short axis.  Reproductive: Status post hysterectomy.  No adnexal masses.  Other: Small volume pelvic ascites.  Musculoskeletal: Visualized osseous structures are within normal limits.  IMPRESSION: Motion degraded  images.  Mildly thick-walled bladder, suspicious for cystitis.  Heterogeneous perfusion of the anterior right upper kidney with suspected mild urothelial thickening/ enhancement of the right renal collecting system. Although nonspecific, these findings are suspicious for pyelonephritis.  No well-defined renal abscess is visualized.   Electronically Signed   By: Julian Hy M.D.   On: 04/01/2015 11:13   Dg Chest 2 View  03/31/2015    CLINICAL DATA:  Fever  EXAM: CHEST  2 VIEW  COMPARISON:  03/31/2015  FINDINGS: Cardiac shadow is within normal limits. The lungs are well aerated bilaterally. Very minimal interstitial changes are seen without focal confluent infiltrate. The overall appearance is similar to that seen on the prior exam. The bony structures are within normal limits.  IMPRESSION: Stable appearance from the prior exam.   Electronically Signed   By: Inez Catalina M.D.   On: 03/31/2015 17:12   Dg Chest 2 View  03/31/2015   CLINICAL DATA:  Difficulty breathing for 2 days.  EXAM: CHEST  2 VIEW  COMPARISON:  10/29/2013  FINDINGS: The cardiac silhouette, mediastinal and hilar contours are within normal limits and stable. The lungs demonstrate mild bronchitic changes with peribronchial thickening and increased interstitial markings. This could reflect bronchitis or interstitial pneumonitis. No focal airspace consolidation or pleural effusion. The bony thorax is intact.  IMPRESSION: Findings suggest bronchitis or interstitial pneumonitis but no airspace consolidation or effusion.   Electronically Signed   By: Marijo Sanes M.D.   On: 03/31/2015 07:50   Ct Renal Stone Study  03/30/2015   CLINICAL DATA:  Proteinuria, low energy for 1 week. On on antibiotics for urinary tract infection. Flank pain rating to LEFT lower quadrant with fevers and chills for 5 days. Vomiting. History of endometriosis, diabetes, ovarian cyst.  EXAM: CT ABDOMEN AND PELVIS WITHOUT CONTRAST  TECHNIQUE: Multidetector CT imaging of the abdomen and pelvis was performed following the standard protocol without IV contrast.  COMPARISON:  None.  FINDINGS: LUNG BASES: Included view of the lung bases are clear. The visualized heart and pericardium are unremarkable.  KIDNEYS/BLADDER: Kidneys are orthotopic, demonstrating normal size and morphology. 3 mm LEFT upper pole, 4 mm LEFT interpolar nephrolithiasis. No hydronephrosis; limited assessment for renal masses on this  nonenhanced examination. The unopacified ureters are normal in course and caliber. Urinary bladder is partially distended with circumferential ala moderate urinary bladder wall thickening, no intravesicular calculi.  SOLID ORGANS: The liver, spleen, gallbladder, pancreas are unremarkable for this non-contrast examination. Symmetrically thickened adrenal glands suggests hyperplasia.  GASTROINTESTINAL TRACT: The stomach, small and large bowel are normal in course and caliber without inflammatory changes, the sensitivity may be decreased by lack of enteric contrast. Normal appendix.  PERITONEUM/RETROPERITONEUM: Aortoiliac vessels are normal in course and caliber, mild calcific atherosclerosis. No lymphadenopathy by CT size criteria. Internal reproductive organs are unremarkable. No intraperitoneal free fluid nor free air. Phleboliths in the pelvis.  SOFT TISSUES/ OSSEOUS STRUCTURES: Asymmetric sclerosis LEFT pubic symphysis is likely degenerative. Scattered chronic Schmorl's nodes. Anterior abdominal wall scarring.  IMPRESSION: Nonobstructing LEFT nephrolithiasis measuring up to 4 mm.  Circumferential urinary bladder wall thickening can be seen with cystitis, recommend correlation with urinary analysis.   Electronically Signed   By: Elon Alas   On: 03/30/2015 22:11      Microbiology: Recent Results (from the past 240 hour(s))  Urine culture     Status: None (Preliminary result)   Collection Time: 03/30/15  7:46 PM  Result Value Ref Range Status   Specimen Description  URINE, CLEAN CATCH  Final   Special Requests Immunocompromised  Final   Colony Count   Final    75,000 COLONIES/ML Performed at [facility]    Culture   Final    ESCHERICHIA COLI Performed at [facility]    Report Status PENDING  Incomplete  MRSA PCR Screening     Status: None   Collection Time: 03/31/15  1:43 AM  Result Value Ref Range Status   MRSA by PCR NEGATIVE NEGATIVE Final    Comment:        The  GeneXpert MRSA Assay (FDA approved for NASAL specimens only), is one component of a comprehensive MRSA colonization surveillance program. It is not intended to diagnose MRSA infection nor to guide or monitor treatment for MRSA infections.   Culture, blood (routine x 2)     Status: None (Preliminary result)   Collection Time: 03/31/15 10:19 AM  Result Value Ref Range Status   Specimen Description BLOOD LEFT ANTECUBITAL  Final   Special Requests BOTTLES DRAWN AEROBIC AND ANAEROBIC 10CC  Final   Culture   Final           BLOOD CULTURE RECEIVED NO GROWTH TO DATE CULTURE WILL BE HELD FOR 5 DAYS BEFORE ISSUING A FINAL NEGATIVE REPORT Performed at [facility]    Report Status PENDING  Incomplete  Culture, blood (routine x 2)     Status: None (Preliminary result)   Collection Time: 03/31/15 10:26 AM  Result Value Ref Range Status   Specimen Description BLOOD RIGHT FOREARM  Final   Special Requests BOTTLES DRAWN AEROBIC ONLY 2CC  Final   Culture   Final           BLOOD CULTURE RECEIVED NO GROWTH TO DATE CULTURE WILL BE HELD FOR 5 DAYS BEFORE ISSUING A FINAL NEGATIVE REPORT Performed at [facility]    Report Status PENDING  Incomplete     Labs: Results for orders placed or performed during the hospital encounter of 03/30/15 (from the past 48 hour(s))  Culture, blood (routine x 2)     Status: None (Preliminary result)   Collection Time: 03/31/15 10:19 AM  Result Value Ref Range   Specimen Description BLOOD LEFT ANTECUBITAL    Special Requests BOTTLES DRAWN AEROBIC AND ANAEROBIC 10CC    Culture             BLOOD CULTURE RECEIVED NO GROWTH TO DATE CULTURE WILL BE HELD FOR 5 DAYS BEFORE ISSUING A FINAL NEGATIVE REPORT Performed at [facility]    Report Status PENDING   Culture, blood (routine x 2)     Status: None (Preliminary result)   Collection Time: 03/31/15 10:26 AM  Result Value Ref Range   Specimen Description BLOOD RIGHT FOREARM    Special Requests  BOTTLES DRAWN AEROBIC ONLY 2CC    Culture             BLOOD CULTURE RECEIVED NO GROWTH TO DATE CULTURE WILL BE HELD FOR 5 DAYS BEFORE ISSUING A FINAL NEGATIVE REPORT Performed at [facility]    Report Status PENDING   Glucose, capillary     Status: Abnormal   Collection Time: 03/31/15 11:00 AM  Result Value Ref Range   Glucose-Capillary 199 (H) 70 - 99 mg/dL  Glucose, capillary     Status: Abnormal   Collection Time: 03/31/15  2:17 PM  Result Value Ref Range   Glucose-Capillary 226 (H) 70 - 99 mg/dL  Glucose, capillary  Status: Abnormal   Collection Time: 03/31/15  5:06 PM  Result Value Ref Range   Glucose-Capillary 271 (H) 70 - 99 mg/dL  Glucose, capillary     Status: Abnormal   Collection Time: 03/31/15  9:22 PM  Result Value Ref Range   Glucose-Capillary 253 (H) 70 - 99 mg/dL  Comprehensive metabolic panel     Status: Abnormal   Collection Time: 04/01/15  4:10 AM  Result Value Ref Range   Sodium 129 (L) 135 - 145 mmol/L   Potassium 3.8 3.5 - 5.1 mmol/L   Chloride 96 96 - 112 mmol/L   CO2 22 19 - 32 mmol/L   Glucose, Bld 223 (H) 70 - 99 mg/dL   BUN 7 6 - 23 mg/dL   Creatinine, Ser 0.77 0.50 - 1.10 mg/dL   Calcium 8.1 (L) 8.4 - 10.5 mg/dL   Total Protein 6.4 6.0 - 8.3 g/dL   Albumin 2.4 (L) 3.5 - 5.2 g/dL   AST 54 (H) 0 - 37 U/L   ALT 31 0 - 35 U/L   Alkaline Phosphatase 99 39 - 117 U/L   Total Bilirubin 0.9 0.3 - 1.2 mg/dL   GFR calc non Af Amer >90 >90 mL/min   GFR calc Af Amer >90 >90 mL/min    Comment: (NOTE) The eGFR has been calculated using the CKD EPI equation. This calculation has not been validated in all clinical situations. eGFR's persistently <90 mL/min signify possible Chronic Kidney Disease.    Anion gap 11 5 - 15  CBC     Status: None   Collection Time: 04/01/15  4:10 AM  Result Value Ref Range   WBC 7.2 4.0 - 10.5 K/uL   RBC 4.32 3.87 - 5.11 MIL/uL   Hemoglobin 13.0 12.0 - 15.0 g/dL   HCT 38.2 36.0 - 46.0 %   MCV 88.4 78.0 - 100.0  fL   MCH 30.1 26.0 - 34.0 pg   MCHC 34.0 30.0 - 36.0 g/dL   RDW 12.0 11.5 - 15.5 %   Platelets 206 150 - 400 K/uL    Comment: DELTA CHECK NOTED REPEATED TO VERIFY   Glucose, capillary     Status: Abnormal   Collection Time: 04/01/15  8:00 AM  Result Value Ref Range   Glucose-Capillary 252 (H) 70 - 99 mg/dL  Glucose, capillary     Status: Abnormal   Collection Time: 04/01/15 11:54 AM  Result Value Ref Range   Glucose-Capillary 322 (H) 70 - 99 mg/dL   Comment 1 Notify RN   Glucose, capillary     Status: Abnormal   Collection Time: 04/01/15  5:34 PM  Result Value Ref Range   Glucose-Capillary 340 (H) 70 - 99 mg/dL   Comment 1 Repeat Test   Glucose, capillary     Status: Abnormal   Collection Time: 04/01/15  9:40 PM  Result Value Ref Range   Glucose-Capillary 293 (H) 70 - 99 mg/dL  Comprehensive metabolic panel     Status: Abnormal   Collection Time: 04/02/15  4:34 AM  Result Value Ref Range   Sodium 135 135 - 145 mmol/L   Potassium 4.5 3.5 - 5.1 mmol/L    Comment: SLIGHT HEMOLYSIS   Chloride 98 96 - 112 mmol/L   CO2 23 19 - 32 mmol/L   Glucose, Bld 300 (H) 70 - 99 mg/dL   BUN 9 6 - 23 mg/dL   Creatinine, Ser 0.84 0.50 - 1.10 mg/dL   Calcium 8.2 (L) 8.4 - 10.5  mg/dL   Total Protein 5.8 (L) 6.0 - 8.3 g/dL   Albumin 2.3 (L) 3.5 - 5.2 g/dL   AST 46 (H) 0 - 37 U/L   ALT 33 0 - 35 U/L   Alkaline Phosphatase 107 39 - 117 U/L   Total Bilirubin 1.4 (H) 0.3 - 1.2 mg/dL   GFR calc non Af Amer 86 (L) >90 mL/min   GFR calc Af Amer >90 >90 mL/min    Comment: (NOTE) The eGFR has been calculated using the CKD EPI equation. This calculation has not been validated in all clinical situations. eGFR's persistently <90 mL/min signify possible Chronic Kidney Disease.    Anion gap 14 5 - 15  CBC     Status: Abnormal   Collection Time: 04/02/15  4:34 AM  Result Value Ref Range   WBC 5.8 4.0 - 10.5 K/uL   RBC 3.97 3.87 - 5.11 MIL/uL   Hemoglobin 12.2 12.0 - 15.0 g/dL   HCT 35.4 (L) 36.0  - 46.0 %   MCV 89.2 78.0 - 100.0 fL   MCH 30.7 26.0 - 34.0 pg   MCHC 34.5 30.0 - 36.0 g/dL   RDW 12.2 11.5 - 15.5 %   Platelets 207 150 - 400 K/uL  Glucose, capillary     Status: Abnormal   Collection Time: 04/02/15  7:40 AM  Result Value Ref Range   Glucose-Capillary 292 (H) 70 - 99 mg/dL   Comment 1 Notify RN      HPI :39 yo female with dm, hx of dka, apparently was tx for uti 2 weeks ago with keflex, and then apparently starting 1 week ago developed flank pain. Pt has had n/v, as well as flank pain recently. Subjective fever. Slight abdominal discomfort. Pt checked herself and was ketone positive and therefore presented to the ED for evaluation and was noted to be tachycardic and in DKA and to have uti.   HOSPITAL COURSE:   Diabetic ketoacidosis Resolved CBG stable Continue home regimen  UTI/follow-up/pyelonephritis  Repeat CT scan today with contrast suspicious for pyeloneprhitis, cystitis, no abcess  Treated with Zosyn 3 days , now switched to ciprofloxacin for 10 days, culture showing greater than 75,000 colonies, speciation and sensitivity pending CT scan with contrast showed 4 mm nonobstructing interpolar left renal calculus  3 mm nonobstructing left upper pole renal calculus , cystitis, right-sided pyelonephritis  Dehydration hydrated with IV fluids during this admission    Discharge Exam:   Blood pressure 110/71, pulse 83, temperature 98.4 F (36.9 C), temperature source Oral, resp. rate 17, height _0  (1.676 m), weight 65.273 kg (143 lb 14.4 oz), last menstrual period 02/10/2012, SpO2 99 %.   General: No acute respiratory distress Lungs: Clear to auscultation bilaterally without wheezes or crackles Cardiovascular: Regular rate and rhythm without murmur gallop or rub normal S1 and S2 Abdomen: Nontender, nondistended, soft, bowel sounds positive, no rebound, no ascites, no appreciable mass Extremities: No significant cyanosis, clubbing, or edema bilateral  lower extremities      Discharge Instructions    Diet - low sodium heart healthy    Complete by:  As directed      Increase activity slowly    Complete by:  As directed              Signed: Jayen Bromwell 04/02/2015, 10:04 AM

## 2015-04-02 NOTE — Progress Notes (Signed)
Discharge instructions/prescription given and explained to pt.  All questions answered to pt's satisfaction  and pt has no questions at this time.  IV removed and site CDI.  Pt in no s/s of distress and discharged to home with sister. Graceann Congress

## 2015-04-06 LAB — CULTURE, BLOOD (ROUTINE X 2)
CULTURE: NO GROWTH
Culture: NO GROWTH

## 2015-06-24 ENCOUNTER — Other Ambulatory Visit: Payer: Self-pay

## 2015-10-12 IMAGING — DX DG CHEST 2V
2 series · 2 of 2 positions shown · non-contrast
Comparison: 10/29/2013

CLINICAL DATA: Difficulty breathing for 2 days.

EXAM:
CHEST  2 VIEW

[chest pa]
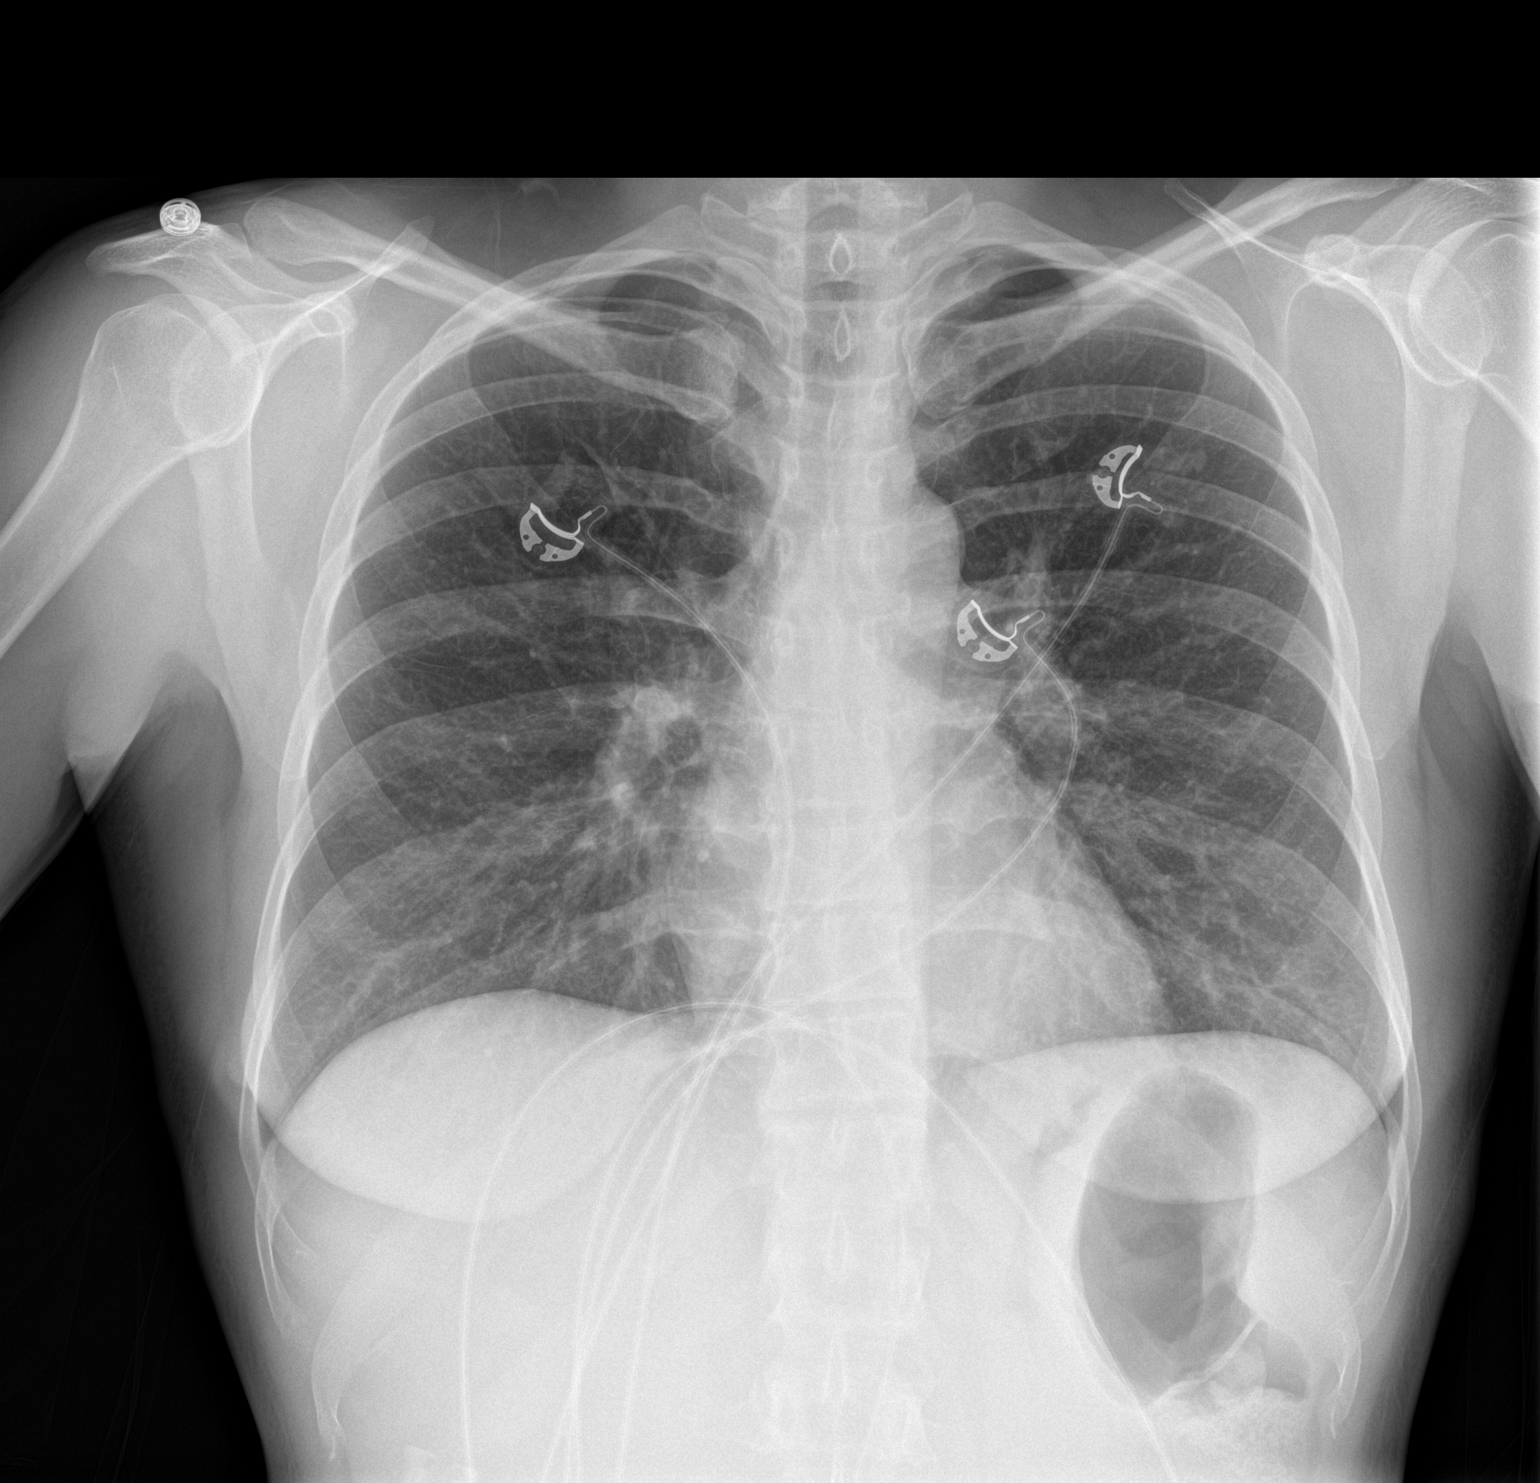

[chest lat]
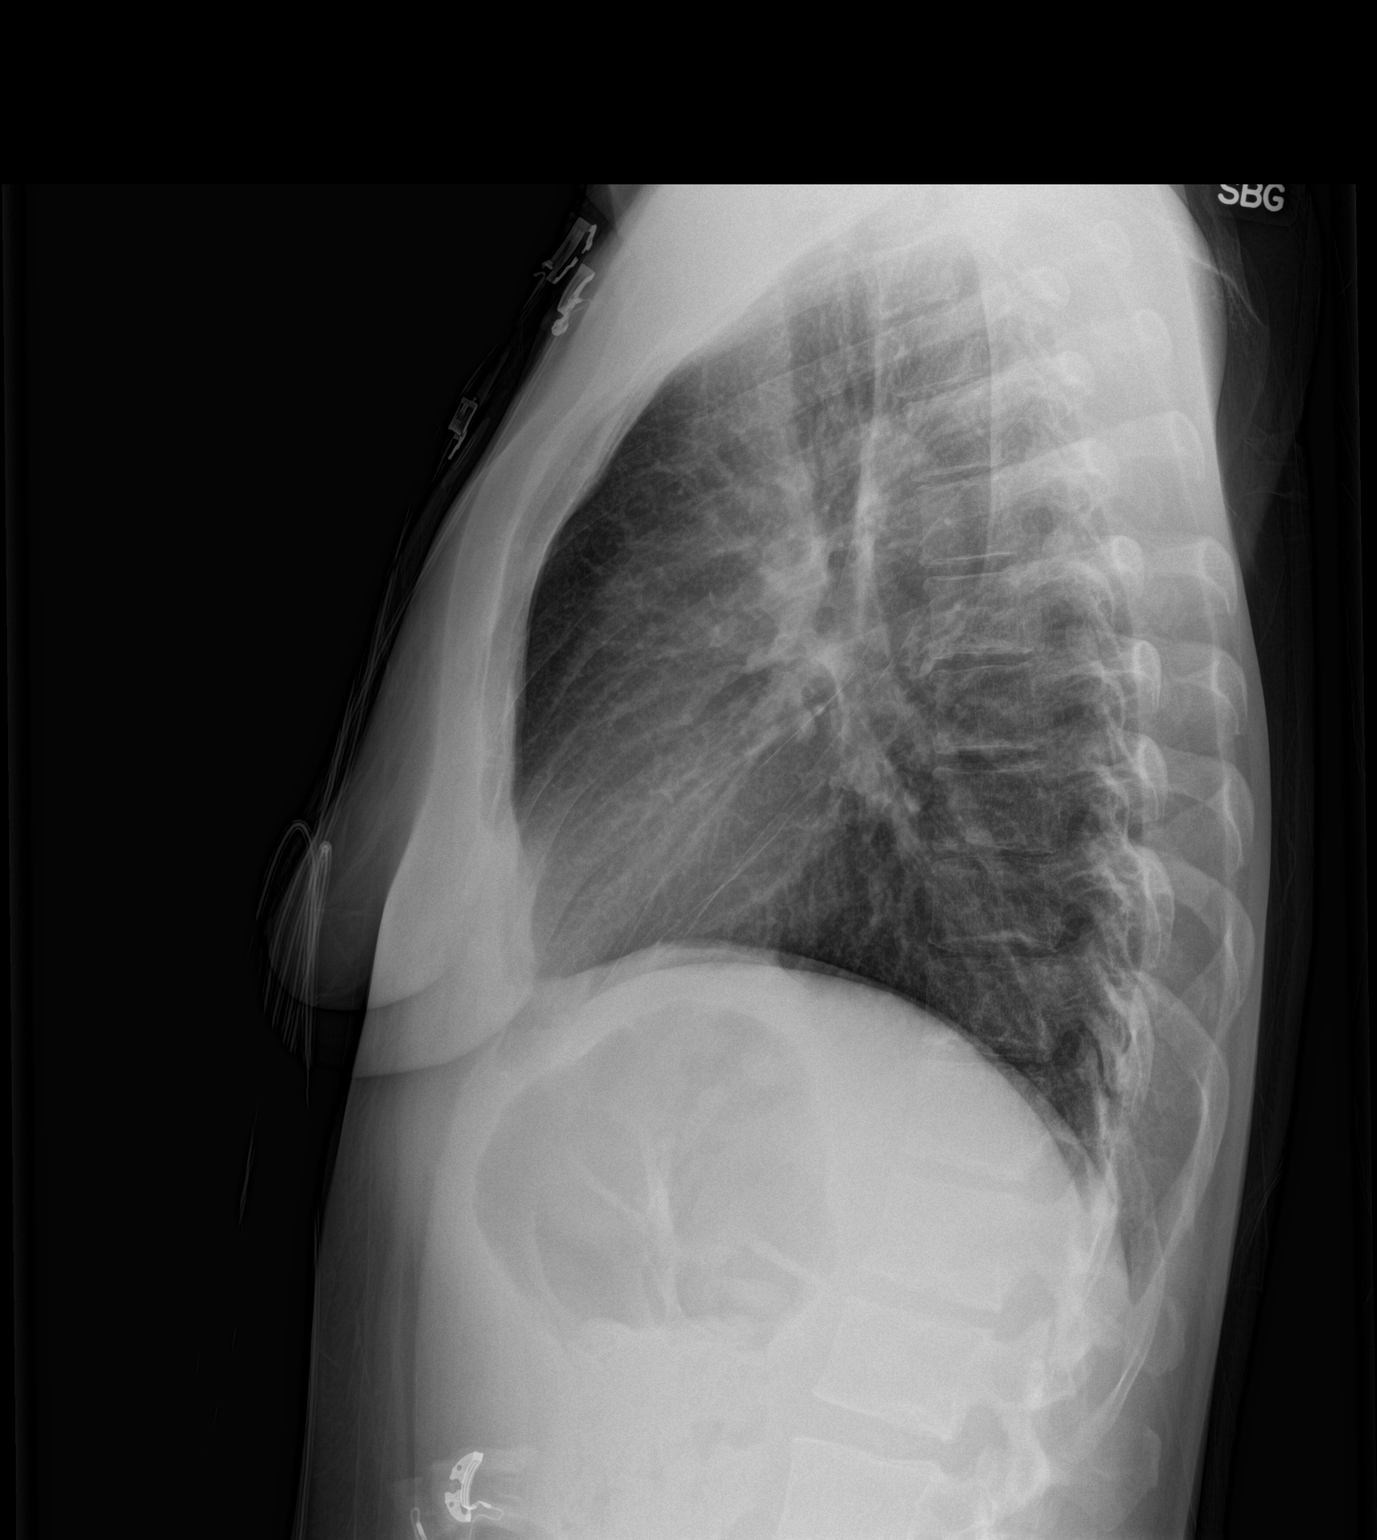

[2 of 2 positions shown; findings below may reference images not displayed]

FINDINGS: The cardiac silhouette, mediastinal and hilar contours are within
normal limits and stable. The lungs demonstrate mild bronchitic
changes with peribronchial thickening and increased interstitial
markings. This could reflect bronchitis or interstitial pneumonitis.
No focal airspace consolidation or pleural effusion. The bony thorax
is intact.
IMPRESSION: Findings suggest bronchitis or interstitial pneumonitis but no
airspace consolidation or effusion.

## 2015-10-12 IMAGING — CR DG CHEST 2V
2 series · 2 of 2 positions shown · non-contrast
Comparison: 03/31/2015

CLINICAL DATA: Fever

EXAM:
CHEST  2 VIEW

[chest pa]
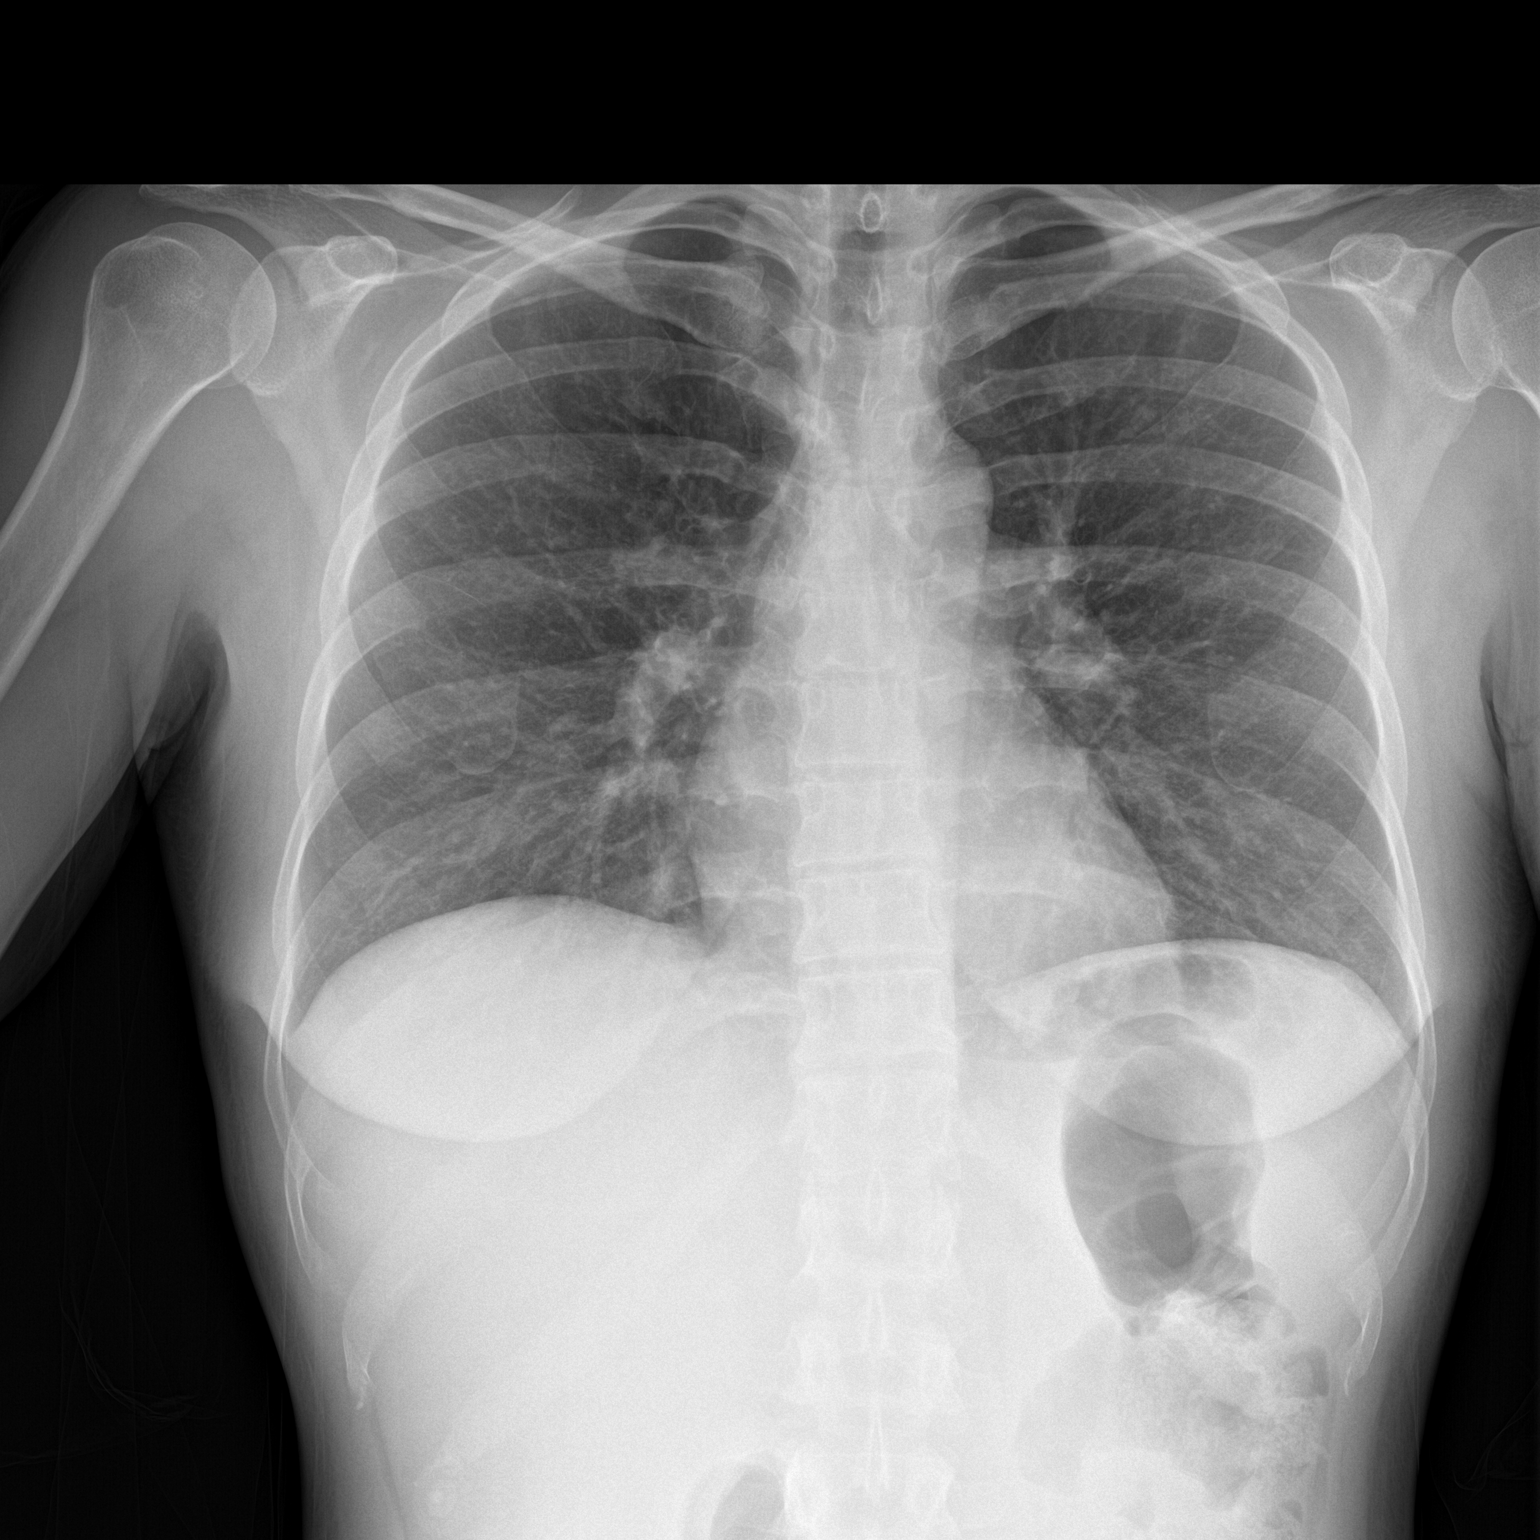

[chest lat]
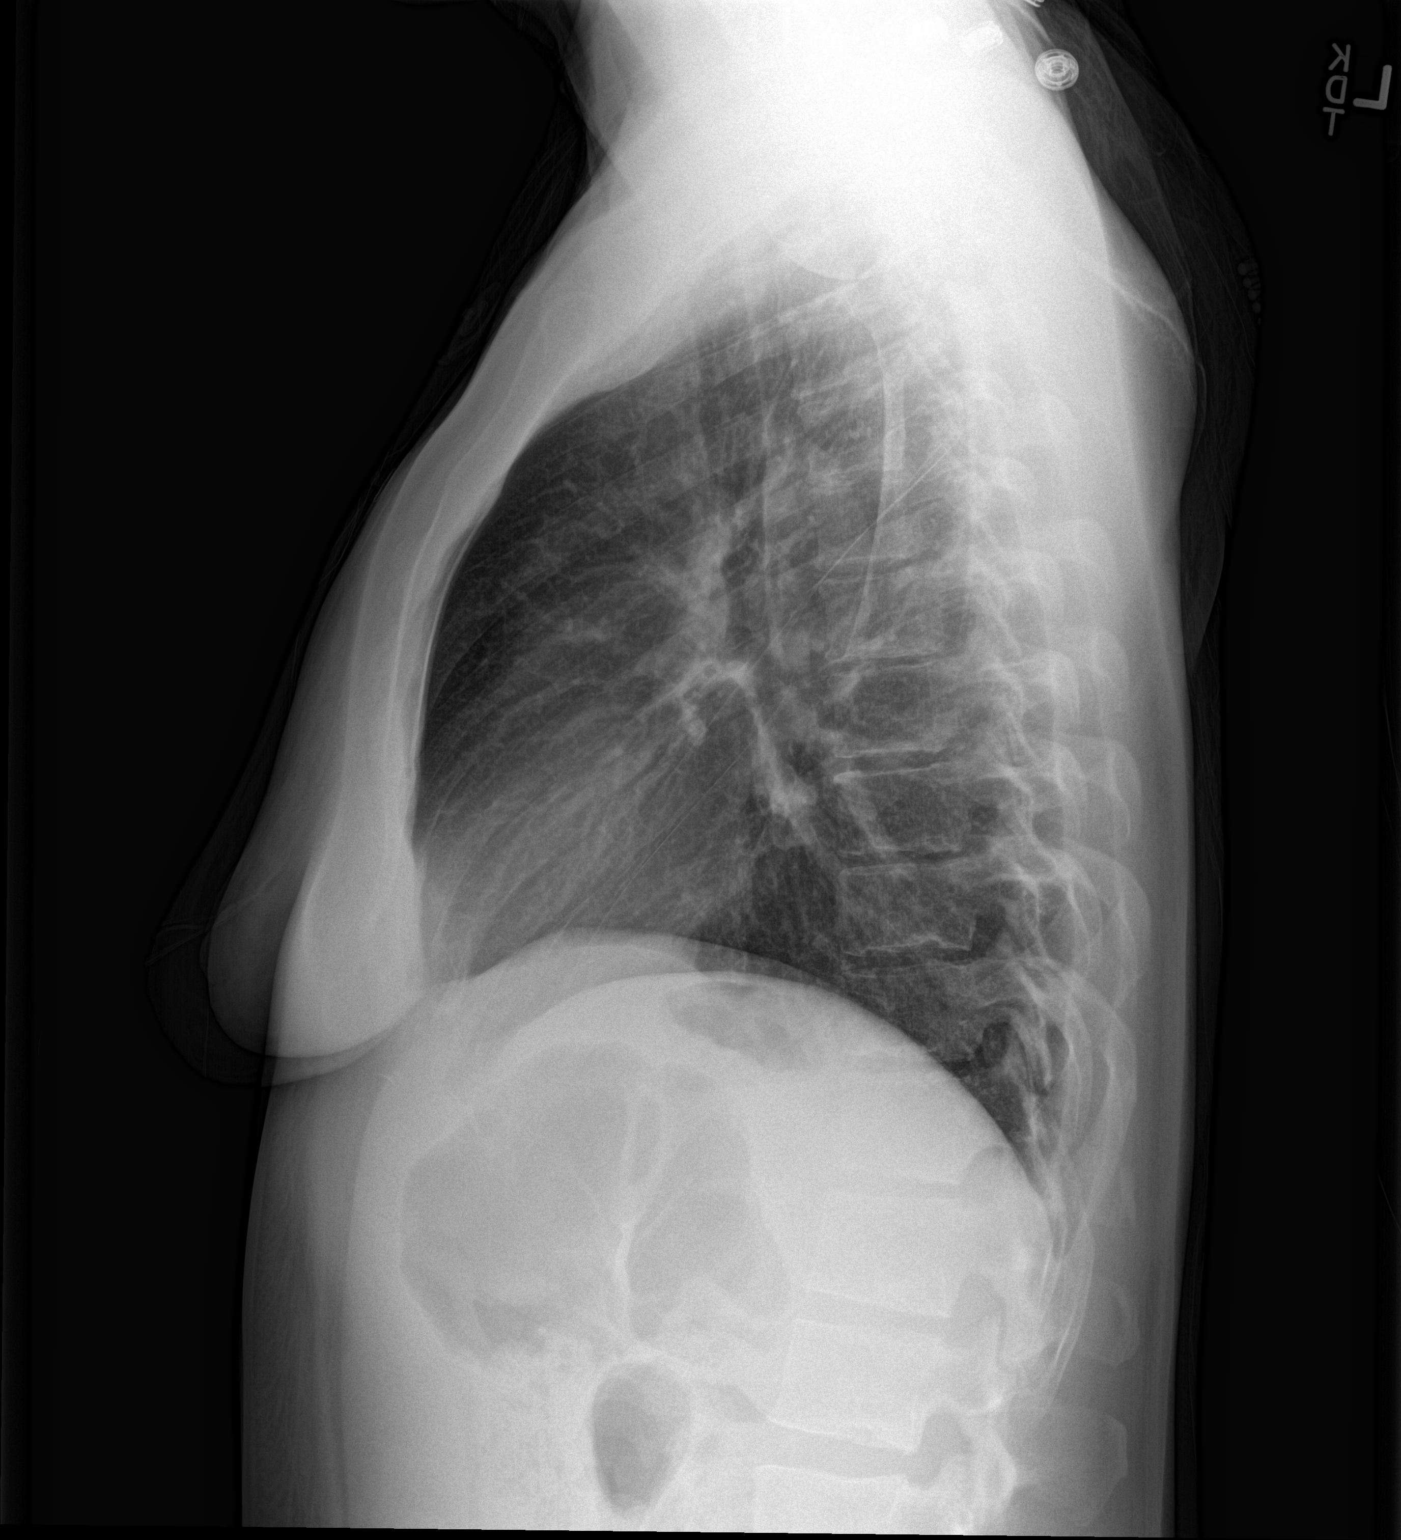

[2 of 2 positions shown; findings below may reference images not displayed]

FINDINGS: Cardiac shadow is within normal limits. The lungs are well aerated
bilaterally. Very minimal interstitial changes are seen without
focal confluent infiltrate. The overall appearance is similar to
that seen on the prior exam. The bony structures are within normal
limits.
IMPRESSION: Stable appearance from the prior exam.

## 2015-12-18 ENCOUNTER — Encounter (HOSPITAL_COMMUNITY): Payer: Self-pay | Admitting: Emergency Medicine

## 2015-12-18 ENCOUNTER — Emergency Department (HOSPITAL_COMMUNITY)
Admission: EM | Admit: 2015-12-18 | Discharge: 2015-12-18 | Disposition: A | Discharge status: Home / Self Care | Payer: Medicaid Other | Source: Home / Self Care | Attending: Family Medicine | Admitting: Family Medicine

## 2015-12-18 DIAGNOSIS — L739 Follicular disorder, unspecified: Secondary | ICD-10-CM | POA: Diagnosis not present

## 2015-12-18 DIAGNOSIS — E109 Type 1 diabetes mellitus without complications: Secondary | ICD-10-CM

## 2015-12-18 MED ORDER — MUPIROCIN CALCIUM 2 % EX CREA: 1.0000 "application " | TOPICAL_CREAM | Freq: Two times a day (BID) | CUTANEOUS | Status: DC

## 2015-12-18 NOTE — ED Notes (Signed)
Patient has a sore on back of scalp, scab present, no redness, no drainage.  Patient admits to picking at places on scalp frequently.  No change in blood sugars per patient

## 2015-12-18 NOTE — ED Provider Notes (Signed)
CSN: [id number]     Arrival date & time 12/18/15  1303 History   First MD Initiated Contact with Patient 12/18/15 1333     Chief Complaint  Patient presents with  . Wound Check   (Consider location/radiation/quality/duration/timing/severity/associated sxs/prior Treatment) Patient is a 40 y.o. female presenting with wound check. The history is provided by the patient. No language interpreter was used.  Wound Check  Patient presents to [facility] with concern for scalp lesion that she noticed about 1 month ago. In the Crown of her scalp, irritated when she combs/brushes her hair. Has not noticed purulence from the wound, no fevers/chills.   Has Type I DM.  No prior history of skin cancer.  Family Hx; no known family hx of skin cancer.   Past Medical History  Diagnosis Date  . Endometriosis   . Ovarian cyst   . Diabetes mellitus   . Angina   . GERD (gastroesophageal reflux disease)   . Anxiety   . Depression   . Dyslipidemia    Past Surgical History  Procedure Laterality Date  . Laparoscopic salpingoopherectomy    . Endometrial ablation    . Wisdom tooth extraction    . Tubal ligation    . Carpal tunnel release      bilateral  . Colonoscopy    . Upper gastrointestinal endoscopy    . Svd      x 1  . Cesarean section      x 1  . Abdominal hysterectomy      still have left ovary  . Trigger finger release      bilateral   Family History  Problem Relation Age of Onset  . Colon polyps Father   . Diabetes Son   . Diabetes Sister   . Cancer Father     ?   Social History  Substance Use Topics  . Smoking status: Current Every Day Smoker -- 1.00 packs/day for 5 years    Types: Cigarettes  . Smokeless tobacco: Never Used  . Alcohol Use: 0.0 oz/week    2-3 drink(s) per week     Comment: occasionally   OB History    Gravida Para Term Preterm AB TAB SAB Ectopic Multiple Living   4 2 1 1 2  2   2      Review of Systems  Constitutional: Negative for fever and chills.  All  other systems reviewed and are negative.   Allergies  Review of patient's allergies indicates no known allergies.  Home Medications   Prior to Admission medications   Medication Sig Start Date End Date Taking? Authorizing Provider  acetaminophen (TYLENOL) 500 MG tablet Take 1 tablet (500 mg total) by mouth every 6 (six) hours as needed for pain. Patient not taking: Reported on 03/30/2015 10/07/13   Leanna Battles, MD  ALPRAZolam Duanne Moron) 0.25 MG tablet Take 0.25 mg by mouth daily as needed (panic attacks).     Historical Provider, MD  ciprofloxacin (CIPRO) 500 MG tablet Take 1 tablet (500 mg total) by mouth 2 (two) times daily. 04/02/15   Reyne Dumas, MD  HYDROcodone-acetaminophen (NORCO/VICODIN) 5-325 MG per tablet Take 2 tablets by mouth every 6 (six) hours as needed. 04/02/15   Reyne Dumas, MD  insulin aspart (NOVOLOG) 100 UNIT/ML injection Inject 3-12 Units into the skin See admin instructions. Inject 3-12 units per sliding scale subcutaneously 3-4 times daily with meals    Historical Provider, MD  insulin glargine (LANTUS) 100 UNIT/ML injection Inject 0.2 mLs (20 Units total)  into the skin 2 (two) times daily. Patient taking differently: Inject 30 Units into the skin 2 (two) times daily.  10/07/13   Leanna Battles, MD  insulin lispro (HUMALOG) 100 UNIT/ML injection Inject 4-15 Units into the skin 6 (six) times daily. Per sliding scale Patient not taking: Reported on 03/10/2015 10/07/13   Leanna Battles, MD  mupirocin cream (BACTROBAN) 2 % Apply 1 application topically 2 (two) times daily. 12/18/15   Willeen Niece, MD   Meds Ordered and Administered this Visit  Medications - No data to display  BP 118/84 mmHg  Pulse 96  Temp(Src) 98.1 F (36.7 C) (Oral)  Resp 16  SpO2 99%  LMP 02/10/2012 No data found.   Physical Exam  Constitutional: She appears well-developed and well-nourished. No distress.  HENT:  Head: Normocephalic.  46mmx4mm eschar on crown of scalp, surrounded by  narrow ring of erythema. No fluctuance. No alopecia surrounding. Another smaller (2x52mm) raised papule on follicule anterior to the lesion in question. No other scalp lesions noticed in skin survey of head/neck.   Eyes: Conjunctivae and EOM are normal.  Neck: Normal range of motion. Neck supple.  Lymphadenopathy:    She has no cervical adenopathy.  Skin: Skin is warm and dry. She is not diaphoretic. No erythema.  Psychiatric: She has a normal mood and affect. Her behavior is normal. Judgment and thought content normal.    ED Course  Procedures (including critical care time)  Labs Review Labs Reviewed - No data to display  Imaging Review No results found.   Visual Acuity Review  Right Eye Distance:   Left Eye Distance:   Bilateral Distance:    Right Eye Near:   Left Eye Near:    Bilateral Near:         MDM   1. Folliculitis   2. Type 1 diabetes mellitus without complication (HCC)    Patient with raised eschar, most consistent with folliculitis with Type I DM.  Cannot exclude AK or localized skin cancer (squamous cell carcinoma).  Disussed plan for topical antibiotic treatment, use of hat/covering to prevent sun damage, and follow with primary doctor in 2-4 weeks for possible biopsy if not improving.  She voices understanding.     Willeen Niece, MD 12/18/15 1351

## 2015-12-18 NOTE — Discharge Instructions (Signed)
It was a pleasure to see you today.  I suspect the lesion on your scalp is a localized follicular infection.   I am prescribing a topical antibiotic, MUPIROCIN 2% cream, apply twice daily.   If it is not improving in the coming 2 weeks, I recommend seeing a primary doctor who can perform a biopsy for possible localized skin cancer or pre-cancer.   Seek more emergent care if you experience fevers/chills, worsening or expanding of the lesion, or marked worsening of your blood sugars that can occur with infections.

## 2016-01-30 ENCOUNTER — Encounter (HOSPITAL_COMMUNITY): Payer: Self-pay | Admitting: *Deleted

## 2016-01-30 DIAGNOSIS — A599 Trichomoniasis, unspecified: Secondary | ICD-10-CM | POA: Diagnosis present

## 2016-01-30 DIAGNOSIS — E1010 Type 1 diabetes mellitus with ketoacidosis without coma: Principal | ICD-10-CM | POA: Diagnosis present

## 2016-01-30 DIAGNOSIS — Z833 Family history of diabetes mellitus: Secondary | ICD-10-CM

## 2016-01-30 DIAGNOSIS — F17210 Nicotine dependence, cigarettes, uncomplicated: Secondary | ICD-10-CM | POA: Diagnosis present

## 2016-01-30 DIAGNOSIS — Z9851 Tubal ligation status: Secondary | ICD-10-CM

## 2016-01-30 DIAGNOSIS — Z794 Long term (current) use of insulin: Secondary | ICD-10-CM

## 2016-01-30 DIAGNOSIS — Z9889 Other specified postprocedural states: Secondary | ICD-10-CM

## 2016-01-30 DIAGNOSIS — N179 Acute kidney failure, unspecified: Secondary | ICD-10-CM | POA: Diagnosis present

## 2016-01-30 DIAGNOSIS — Z8371 Family history of colonic polyps: Secondary | ICD-10-CM

## 2016-01-30 DIAGNOSIS — Z809 Family history of malignant neoplasm, unspecified: Secondary | ICD-10-CM

## 2016-01-30 DIAGNOSIS — E860 Dehydration: Secondary | ICD-10-CM | POA: Diagnosis present

## 2016-01-30 DIAGNOSIS — Z79899 Other long term (current) drug therapy: Secondary | ICD-10-CM

## 2016-01-30 DIAGNOSIS — Z90710 Acquired absence of both cervix and uterus: Secondary | ICD-10-CM

## 2016-01-30 DIAGNOSIS — K219 Gastro-esophageal reflux disease without esophagitis: Secondary | ICD-10-CM | POA: Diagnosis present

## 2016-01-30 LAB — COMPREHENSIVE METABOLIC PANEL
ALBUMIN: 4.5 g/dL (ref 3.5–5.0)
ALT: 21 U/L (ref 14–54)
AST: 23 U/L (ref 15–41)
Alkaline Phosphatase: 86 U/L (ref 38–126)
Anion gap: 28 — ABNORMAL HIGH (ref 5–15)
BUN: 20 mg/dL (ref 6–20)
CHLORIDE: 100 mmol/L — AB (ref 101–111)
CO2: 8 mmol/L — ABNORMAL LOW (ref 22–32)
Calcium: 10 mg/dL (ref 8.9–10.3)
Creatinine, Ser: 1.58 mg/dL — ABNORMAL HIGH (ref 0.44–1.00)
GFR calc Af Amer: 46 mL/min — ABNORMAL LOW (ref >60)
GFR, EST NON AFRICAN AMERICAN: 40 mL/min — AB (ref >60)
GLUCOSE: 476 mg/dL — AB (ref 65–99)
POTASSIUM: 4.8 mmol/L (ref 3.5–5.1)
Sodium: 136 mmol/L (ref 135–145)
TOTAL PROTEIN: 7.5 g/dL (ref 6.5–8.1)
Total Bilirubin: 1.9 mg/dL — ABNORMAL HIGH (ref 0.3–1.2)

## 2016-01-30 LAB — CBG MONITORING, ED: GLUCOSE-CAPILLARY: 463 mg/dL — AB (ref 65–99)

## 2016-01-30 LAB — CBC
HEMATOCRIT: 47.8 % — AB (ref 36.0–46.0)
Hemoglobin: 16.5 g/dL — ABNORMAL HIGH (ref 12.0–15.0)
MCH: 31.3 pg (ref 26.0–34.0)
MCHC: 34.5 g/dL (ref 30.0–36.0)
MCV: 90.5 fL (ref 78.0–100.0)
Platelets: 460 10*3/uL — ABNORMAL HIGH (ref 150–400)
RBC: 5.28 MIL/uL — ABNORMAL HIGH (ref 3.87–5.11)
RDW: 12.5 % (ref 11.5–15.5)
WBC: 11.5 10*3/uL — AB (ref 4.0–10.5)

## 2016-01-30 LAB — LIPASE, BLOOD: Lipase: 28 U/L (ref 11–51)

## 2016-01-30 LAB — I-STAT BETA HCG BLOOD, ED (MC, WL, AP ONLY): I-stat hCG, quantitative: <5 m[IU]/mL (ref <5)

## 2016-01-30 MED ORDER — ONDANSETRON 4 MG PO TBDP
4.0000 mg | ORAL_TABLET | Freq: Once | ORAL | Status: AC | PRN
  Administered 2016-01-30: 8 mg via ORAL

## 2016-01-30 MED ORDER — ONDANSETRON 4 MG PO TBDP
ORAL_TABLET | ORAL | Status: AC
  Filled 2016-01-30: qty 2

## 2016-01-30 NOTE — ED Notes (Addendum)
Patient with abdominal pain and vomiting since this am. Hx of diabetes, usually runs in 200s. Is on sliding scale. Patient actively vomiting in triage.

## 2016-01-31 ENCOUNTER — Emergency Department (HOSPITAL_COMMUNITY): Payer: Medicaid Other

## 2016-01-31 ENCOUNTER — Encounter (HOSPITAL_COMMUNITY): Payer: Self-pay | Admitting: Internal Medicine

## 2016-01-31 ENCOUNTER — Inpatient Hospital Stay (HOSPITAL_COMMUNITY)
Admission: EM | Admit: 2016-01-31 | Discharge: 2016-02-01 | DRG: 638 | Disposition: A | Discharge status: Home / Self Care | Payer: Medicaid Other | Attending: Internal Medicine | Admitting: Internal Medicine

## 2016-01-31 DIAGNOSIS — D751 Secondary polycythemia: Secondary | ICD-10-CM

## 2016-01-31 DIAGNOSIS — F17210 Nicotine dependence, cigarettes, uncomplicated: Secondary | ICD-10-CM | POA: Diagnosis present

## 2016-01-31 DIAGNOSIS — Z809 Family history of malignant neoplasm, unspecified: Secondary | ICD-10-CM | POA: Diagnosis not present

## 2016-01-31 DIAGNOSIS — E1010 Type 1 diabetes mellitus with ketoacidosis without coma: Secondary | ICD-10-CM | POA: Diagnosis present

## 2016-01-31 DIAGNOSIS — N179 Acute kidney failure, unspecified: Secondary | ICD-10-CM | POA: Diagnosis not present

## 2016-01-31 DIAGNOSIS — R112 Nausea with vomiting, unspecified: Secondary | ICD-10-CM | POA: Diagnosis not present

## 2016-01-31 DIAGNOSIS — Z79899 Other long term (current) drug therapy: Secondary | ICD-10-CM | POA: Diagnosis not present

## 2016-01-31 DIAGNOSIS — K219 Gastro-esophageal reflux disease without esophagitis: Secondary | ICD-10-CM | POA: Diagnosis present

## 2016-01-31 DIAGNOSIS — Z794 Long term (current) use of insulin: Secondary | ICD-10-CM | POA: Diagnosis not present

## 2016-01-31 DIAGNOSIS — A599 Trichomoniasis, unspecified: Secondary | ICD-10-CM | POA: Diagnosis present

## 2016-01-31 DIAGNOSIS — Z9889 Other specified postprocedural states: Secondary | ICD-10-CM | POA: Diagnosis not present

## 2016-01-31 DIAGNOSIS — E1110 Type 2 diabetes mellitus with ketoacidosis without coma: Secondary | ICD-10-CM | POA: Diagnosis present

## 2016-01-31 DIAGNOSIS — R1110 Vomiting, unspecified: Secondary | ICD-10-CM | POA: Insufficient documentation

## 2016-01-31 DIAGNOSIS — E0810 Diabetes mellitus due to underlying condition with ketoacidosis without coma: Secondary | ICD-10-CM | POA: Diagnosis not present

## 2016-01-31 DIAGNOSIS — E860 Dehydration: Secondary | ICD-10-CM | POA: Diagnosis present

## 2016-01-31 DIAGNOSIS — Z8371 Family history of colonic polyps: Secondary | ICD-10-CM | POA: Diagnosis not present

## 2016-01-31 DIAGNOSIS — Z90710 Acquired absence of both cervix and uterus: Secondary | ICD-10-CM | POA: Diagnosis not present

## 2016-01-31 DIAGNOSIS — Z9851 Tubal ligation status: Secondary | ICD-10-CM | POA: Diagnosis not present

## 2016-01-31 DIAGNOSIS — Z833 Family history of diabetes mellitus: Secondary | ICD-10-CM | POA: Diagnosis not present

## 2016-01-31 LAB — URINE MICROSCOPIC-ADD ON

## 2016-01-31 LAB — CBC WITH DIFFERENTIAL/PLATELET
BASOS PCT: 0 %
Basophils Absolute: 0 10*3/uL (ref 0.0–0.1)
EOS ABS: 0 10*3/uL (ref 0.0–0.7)
Eosinophils Relative: 0 %
HCT: 42.4 % (ref 36.0–46.0)
HEMOGLOBIN: 14.8 g/dL (ref 12.0–15.0)
Lymphocytes Relative: 7 %
Lymphs Abs: 1.1 10*3/uL (ref 0.7–4.0)
MCH: 32.1 pg (ref 26.0–34.0)
MCHC: 34.9 g/dL (ref 30.0–36.0)
MCV: 92 fL (ref 78.0–100.0)
MONOS PCT: 4 %
Monocytes Absolute: 0.6 10*3/uL (ref 0.1–1.0)
NEUTROS PCT: 89 %
Neutro Abs: 14.2 10*3/uL — ABNORMAL HIGH (ref 1.7–7.7)
Platelets: 360 10*3/uL (ref 150–400)
RBC: 4.61 MIL/uL (ref 3.87–5.11)
RDW: 12.7 % (ref 11.5–15.5)
WBC: 15.9 10*3/uL — AB (ref 4.0–10.5)

## 2016-01-31 LAB — CBG MONITORING, ED
GLUCOSE-CAPILLARY: 369 mg/dL — AB (ref 65–99)
GLUCOSE-CAPILLARY: 191 mg/dL — AB (ref 65–99)
Glucose-Capillary: 219 mg/dL — ABNORMAL HIGH (ref 65–99)
Glucose-Capillary: 270 mg/dL — ABNORMAL HIGH (ref 65–99)
Glucose-Capillary: 566 mg/dL (ref 65–99)

## 2016-01-31 LAB — URINALYSIS, ROUTINE W REFLEX MICROSCOPIC
Bilirubin Urine: NEGATIVE
Hgb urine dipstick: NEGATIVE
LEUKOCYTES UA: NEGATIVE
NITRITE: NEGATIVE
PROTEIN: NEGATIVE mg/dL
Specific Gravity, Urine: 1.027 (ref 1.005–1.030)
pH: 5 (ref 5.0–8.0)

## 2016-01-31 LAB — I-STAT VENOUS BLOOD GAS, ED
Acid-base deficit: 19 mmol/L — ABNORMAL HIGH (ref 0.0–2.0)
BICARBONATE: 7.6 meq/L — AB (ref 20.0–24.0)
O2 Saturation: 73 %
PCO2 VEN: 21 mmHg — AB (ref 45.0–50.0)
PH VEN: 7.167 — AB (ref 7.250–7.300)
PO2 VEN: 47 mmHg — AB (ref 30.0–45.0)
TCO2: 8 mmol/L (ref 0–100)

## 2016-01-31 LAB — BASIC METABOLIC PANEL
ANION GAP: 19 — AB (ref 5–15)
Anion gap: 11 (ref 5–15)
Anion gap: 12 (ref 5–15)
BUN: 18 mg/dL (ref 6–20)
BUN: 10 mg/dL (ref 6–20)
BUN: 8 mg/dL (ref 6–20)
CALCIUM: 8.1 mg/dL — AB (ref 8.9–10.3)
CALCIUM: 7.8 mg/dL — AB (ref 8.9–10.3)
CALCIUM: 7.8 mg/dL — AB (ref 8.9–10.3)
CHLORIDE: 118 mmol/L — AB (ref 101–111)
CO2: 8 mmol/L — ABNORMAL LOW (ref 22–32)
CO2: 13 mmol/L — ABNORMAL LOW (ref 22–32)
CO2: 12 mmol/L — ABNORMAL LOW (ref 22–32)
CREATININE: 1.42 mg/dL — AB (ref 0.44–1.00)
CREATININE: 1.05 mg/dL — AB (ref 0.44–1.00)
CREATININE: 0.97 mg/dL (ref 0.44–1.00)
Chloride: 116 mmol/L — ABNORMAL HIGH (ref 101–111)
Chloride: 112 mmol/L — ABNORMAL HIGH (ref 101–111)
GFR calc non Af Amer: 45 mL/min — ABNORMAL LOW (ref >60)
GFR, EST AFRICAN AMERICAN: 52 mL/min — AB (ref >60)
Glucose, Bld: 238 mg/dL — ABNORMAL HIGH (ref 65–99)
Glucose, Bld: 141 mg/dL — ABNORMAL HIGH (ref 65–99)
Glucose, Bld: 131 mg/dL — ABNORMAL HIGH (ref 65–99)
Potassium: 4.7 mmol/L (ref 3.5–5.1)
Potassium: 4 mmol/L (ref 3.5–5.1)
Potassium: 3.7 mmol/L (ref 3.5–5.1)
SODIUM: 145 mmol/L (ref 135–145)
SODIUM: 140 mmol/L (ref 135–145)
SODIUM: 136 mmol/L (ref 135–145)

## 2016-01-31 LAB — TSH: TSH: 0.275 u[IU]/mL — AB (ref 0.350–4.500)

## 2016-01-31 LAB — GLUCOSE, CAPILLARY
GLUCOSE-CAPILLARY: 161 mg/dL — AB (ref 65–99)
GLUCOSE-CAPILLARY: 168 mg/dL — AB (ref 65–99)
GLUCOSE-CAPILLARY: 124 mg/dL — AB (ref 65–99)
GLUCOSE-CAPILLARY: 128 mg/dL — AB (ref 65–99)
Glucose-Capillary: 143 mg/dL — ABNORMAL HIGH (ref 65–99)
Glucose-Capillary: 112 mg/dL — ABNORMAL HIGH (ref 65–99)
Glucose-Capillary: 123 mg/dL — ABNORMAL HIGH (ref 65–99)
Glucose-Capillary: 112 mg/dL — ABNORMAL HIGH (ref 65–99)
Glucose-Capillary: 95 mg/dL (ref 65–99)
Glucose-Capillary: 112 mg/dL — ABNORMAL HIGH (ref 65–99)

## 2016-01-31 LAB — TROPONIN I
TROPONIN I: 0.03 ng/mL (ref <0.031)
Troponin I: <0.03 ng/mL (ref <0.031)

## 2016-01-31 LAB — MRSA PCR SCREENING: MRSA by PCR: NEGATIVE

## 2016-01-31 MED ORDER — SODIUM CHLORIDE 0.9 % IV SOLN
INTRAVENOUS | Status: DC
  Administered 2016-01-31: 03:00:00 via INTRAVENOUS

## 2016-01-31 MED ORDER — SODIUM CHLORIDE 0.9 % IV BOLUS (SEPSIS)
2000.0000 mL | Freq: Once | INTRAVENOUS | Status: AC
  Administered 2016-01-31: 2000 mL via INTRAVENOUS

## 2016-01-31 MED ORDER — METRONIDAZOLE IN NACL 5-0.79 MG/ML-% IV SOLN
500.0000 mg | Freq: Three times a day (TID) | INTRAVENOUS | Status: DC
  Administered 2016-01-31 – 2016-02-01 (×4): 500 mg via INTRAVENOUS
  Filled 2016-01-31 (×6): qty 100

## 2016-01-31 MED ORDER — INSULIN GLARGINE 100 UNIT/ML ~~LOC~~ SOLN
20.0000 [IU] | Freq: Two times a day (BID) | SUBCUTANEOUS | Status: DC
  Administered 2016-01-31 – 2016-02-01 (×3): 20 [IU] via SUBCUTANEOUS
  Filled 2016-01-31 (×4): qty 0.2

## 2016-01-31 MED ORDER — PANTOPRAZOLE SODIUM 40 MG PO TBEC
40.0000 mg | DELAYED_RELEASE_TABLET | Freq: Every day | ORAL | Status: DC
  Administered 2016-01-31 – 2016-02-01 (×2): 40 mg via ORAL
  Filled 2016-01-31 (×2): qty 1

## 2016-01-31 MED ORDER — SODIUM CHLORIDE 0.9 % IV BOLUS (SEPSIS)
1000.0000 mL | Freq: Once | INTRAVENOUS | Status: AC
  Administered 2016-01-31: 1000 mL via INTRAVENOUS

## 2016-01-31 MED ORDER — INSULIN ASPART 100 UNIT/ML ~~LOC~~ SOLN
0.0000 [IU] | Freq: Three times a day (TID) | SUBCUTANEOUS | Status: DC
  Administered 2016-01-31: 2 [IU] via SUBCUTANEOUS

## 2016-01-31 MED ORDER — ENOXAPARIN SODIUM 40 MG/0.4ML ~~LOC~~ SOLN
40.0000 mg | SUBCUTANEOUS | Status: DC
  Administered 2016-01-31 – 2016-02-01 (×2): 40 mg via SUBCUTANEOUS
  Filled 2016-01-31 (×2): qty 0.4

## 2016-01-31 MED ORDER — INSULIN ASPART 100 UNIT/ML ~~LOC~~ SOLN
10.0000 [IU] | Freq: Once | SUBCUTANEOUS | Status: AC
  Administered 2016-01-31: 10 [IU] via INTRAVENOUS
  Filled 2016-01-31: qty 1

## 2016-01-31 MED ORDER — ONDANSETRON HCL 4 MG/2ML IJ SOLN
4.0000 mg | Freq: Once | INTRAMUSCULAR | Status: AC
  Administered 2016-01-31: 4 mg via INTRAVENOUS
  Filled 2016-01-31: qty 2

## 2016-01-31 MED ORDER — SODIUM CHLORIDE 0.9 % IV SOLN
INTRAVENOUS | Status: DC
  Administered 2016-01-31: 3.1 [IU]/h via INTRAVENOUS
  Filled 2016-01-31: qty 2.5

## 2016-01-31 MED ORDER — CALCIUM CARBONATE ANTACID 500 MG PO CHEW
1.0000 | CHEWABLE_TABLET | Freq: Three times a day (TID) | ORAL | Status: DC | PRN
  Filled 2016-01-31: qty 1

## 2016-01-31 MED ORDER — POTASSIUM CHLORIDE 10 MEQ/100ML IV SOLN
10.0000 meq | INTRAVENOUS | Status: AC
  Administered 2016-01-31 (×4): 10 meq via INTRAVENOUS
  Filled 2016-01-31 (×4): qty 100

## 2016-01-31 MED ORDER — ALPRAZOLAM 0.25 MG PO TABS: 0.2500 mg | ORAL_TABLET | Freq: Every day | ORAL | Status: DC | PRN

## 2016-01-31 MED ORDER — DEXTROSE-NACL 5-0.45 % IV SOLN
INTRAVENOUS | Status: DC
  Administered 2016-01-31: 05:00:00 via INTRAVENOUS

## 2016-01-31 MED ORDER — DEXTROSE-NACL 5-0.45 % IV SOLN
INTRAVENOUS | Status: DC
  Administered 2016-01-31 (×2): via INTRAVENOUS

## 2016-01-31 NOTE — Progress Notes (Signed)
[facility] PROGRESS NOTE  Sheryl White [id number] DOB: Jun 19, 1976 DOA: 01/31/2016 PCP: Geoffery Lyons, MD  HPI/Brief narrative Please review h and p from 2/17 for details. Briefly, 40 y.o. female with known history of diabetes mellitus type 1 presents to the ER because of persistent nausea vomiting since the morning of admission, found to be in florid DKA. Patient was admitted for further work up.  Assessment/Plan: 1. Diabetic ketoacidosis in type 1 diabetes - possibly precipitated by Trichomoniasis?. Patient has been started on IV insulin infusion and aggressive IV hydration with insulin gtt. DKA has resolved and have transitioned to home subQ insulin 2. Acute renal failure - probably from dehydration from nausea vomiting with DKA. Improved with IVF hydration 3. Trichomoniasis - patient has been placed on Flagyl. Partner will need to be advised to be treated. 4. Nausea vomiting - Lipase and LFTs are within acceptable limits. Closely observe. Likely secondary to DKA. Much improved 5. Sinus tachycardia - probably from dehydration. TSH 0.275. Will check free T4 in AM  Code Status: Full Family Communication: Pt in room Disposition Plan: Possible d/c in 24hrs   Consultants:    Procedures:    Antibiotics: Anti-infectives    Start     Dose/Rate Route Frequency Ordered Stop   01/31/16 0245  metroNIDAZOLE (FLAGYL) IVPB 500 mg     500 mg 100 mL/hr over 60 Minutes Intravenous Every 8 hours 01/31/16 0235         HPI/Subjective: Feels better today. Eager to eat  Objective: Filed Vitals:   01/31/16 1400 01/31/16 1500 01/31/16 1600 01/31/16 1700  BP: 106/59 100/62 117/72 121/79  Pulse: 104 96 89 92  Temp:      TempSrc:      Resp: 17 15 17 19   Weight:      SpO2: 100% 100% 100% 100%    Intake/Output Summary (Last 24 hours) at 01/31/16 1759 Last data filed at 01/31/16 1500  Gross per 24 hour  Intake 5526.35 ml  Output    825 ml  Net 4701.35 ml   Filed  Weights   01/31/16 0715  Weight: 63.6 kg (140 lb 3.4 oz)    Exam:   General:  Awake, in nad  Cardiovascular: regular, s1, s2  Respiratory: normal resp effort, no wheezing  Abdomen: soft,nondistended  Musculoskeletal: perfused, no clubbing   Data Reviewed: Basic Metabolic Panel:  Recent Labs Lab 01/30/16 2136 01/31/16 0446 01/31/16 1035 01/31/16 1436  NA 136 145 140 136  K 4.8 4.7 4.0 3.7  CL 100* 118* 116* 112*  CO2 8* 8* 13* 12*  GLUCOSE 476* 238* 141* 131*  BUN 20 18 10 8   CREATININE 1.58* 1.42* 1.05* 0.97  CALCIUM 10.0 8.1* 7.8* 7.8*   Liver Function Tests:  Recent Labs Lab 01/30/16 2136  AST 23  ALT 21  ALKPHOS 86  BILITOT 1.9*  PROT 7.5  ALBUMIN 4.5    Recent Labs Lab 01/30/16 2136  LIPASE 28   No results for input(s): AMMONIA in the last 168 hours. CBC:  Recent Labs Lab 01/30/16 2136 01/31/16 0446  WBC 11.5* 15.9*  NEUTROABS  --  14.2*  HGB 16.5* 14.8  HCT 47.8* 42.4  MCV 90.5 92.0  PLT 460* 360   Cardiac Enzymes:  Recent Labs Lab 01/31/16 0112 01/31/16 0446  TROPONINI 0.03 <0.03   BNP (last 3 results) No results for input(s): BNP in the last 8760 hours.  ProBNP (last 3 results) No results for input(s): PROBNP in the last 8760 hours.  CBG:  Recent Labs Lab 01/31/16 1237 01/31/16 1346 01/31/16 1435 01/31/16 1533 01/31/16 1723  GLUCAP 112* 95 112* 124* 128*    Recent Results (from the past 240 hour(s))  MRSA PCR Screening     Status: None   Collection Time: 01/31/16  7:30 AM  Result Value Ref Range Status   MRSA by PCR NEGATIVE NEGATIVE Final    Comment:        The GeneXpert MRSA Assay (FDA approved for NASAL specimens only), is one component of a comprehensive MRSA colonization surveillance program. It is not intended to diagnose MRSA infection nor to guide or monitor treatment for MRSA infections.      Studies: Dg Chest Port 1 View  01/31/2016  CLINICAL DATA:  40 year old female with diabetic  ketoacidosis, abdominal pain and vomiting. EXAM: PORTABLE CHEST 1 VIEW COMPARISON:  Radiograph dated 03/31/2015 FINDINGS: Single-view of the chest does not demonstrate a focal consolidation. There is no significant pleural effusion or pneumothorax. The costophrenic angles have been excluded from the image. The cardiac silhouette is within normal limits. No acute osseous pathology. IMPRESSION: No active disease. Electronically Signed   By: Anner Crete M.D.   On: 01/31/2016 01:43    Scheduled Meds: . enoxaparin (LOVENOX) injection  40 mg Subcutaneous Q24H  . insulin aspart  0-15 Units Subcutaneous TID WC  . insulin glargine  20 Units Subcutaneous BID  . metronidazole  500 mg Intravenous Q8H  . pantoprazole  40 mg Oral Daily   Continuous Infusions:   Principal Problem:   DKA (diabetic ketoacidoses) ([place]) Active Problems:   ARF (acute renal failure) (H[facility])   Nausea & vomiting   DKA, type 1 ([place])   [facility], [facility] Pager [phone]. If 7PM-7AM, please contact night-coverage at [email or web address], password [facility] 01/31/2016, 5:59 PM  LOS: 0 days

## 2016-01-31 NOTE — ED Provider Notes (Signed)
CSN: [id number]     Arrival date & time 01/30/16  2111 History   By signing my name below, I, Forrestine Him, attest that this documentation has been prepared under the direction and in the presence of Delora Fuel, MD.  Electronically Signed: Forrestine Him, ED Scribe. 01/31/2016. 12:59 AM.   Chief Complaint  Patient presents with  . Abdominal Pain   The history is provided by the patient. No language interpreter was used.    HPI Comments: Sheryl White is a 40 y.o. female with a PMHx of DM who presents to the Emergency Department complaining of constant, ongoing vomiting with associated nausea x 1 day. She is unable to keep any solid foods or liquids down at this time. Pt also reports mild abdominal discomfort and generalized weakness. No aggravating or alleviating factors at this time. No OTC/prescribed medications attempted prior to arrival. No recent fever, chills, chest pain, or shortness of breath. Pt states her blood sugar was 250 earlier this evening. Currently she in on a sliding scale for her insulin.  PCP: Geoffery Lyons, MD    Past Medical History  Diagnosis Date  . Endometriosis   . Ovarian cyst   . Diabetes mellitus   . Angina   . GERD (gastroesophageal reflux disease)   . Anxiety   . Depression   . Dyslipidemia    Past Surgical History  Procedure Laterality Date  . Laparoscopic salpingoopherectomy    . Endometrial ablation    . Wisdom tooth extraction    . Tubal ligation    . Carpal tunnel release      bilateral  . Colonoscopy    . Upper gastrointestinal endoscopy    . Svd      x 1  . Cesarean section      x 1  . Abdominal hysterectomy      still have left ovary  . Trigger finger release      bilateral   Family History  Problem Relation Age of Onset  . Colon polyps Father   . Diabetes Son   . Diabetes Sister   . Cancer Father     ?   Social History  Substance Use Topics  . Smoking status: Current Every Day Smoker -- 1.00 packs/day for 5 years     Types: Cigarettes  . Smokeless tobacco: Never Used  . Alcohol Use: 0.0 oz/week    2-3 drink(s) per week     Comment: occasionally   OB History    Gravida Para Term Preterm AB TAB SAB Ectopic Multiple Living   4 2 1 1 2  2   2      Review of Systems  Constitutional: Negative for fever and chills.  Respiratory: Negative for cough and shortness of breath.   Cardiovascular: Negative for chest pain.  Gastrointestinal: Positive for nausea, vomiting and abdominal pain.  Neurological: Positive for weakness. Negative for headaches.  Psychiatric/Behavioral: Negative for confusion.  All other systems reviewed and are negative.     Allergies  Review of patient's allergies indicates no known allergies.  Home Medications   Prior to Admission medications   Medication Sig Start Date End Date Taking? Authorizing Provider  acetaminophen (TYLENOL) 500 MG tablet Take 1 tablet (500 mg total) by mouth every 6 (six) hours as needed for pain. Patient not taking: Reported on 03/30/2015 10/07/13   Leanna Battles, MD  ALPRAZolam Duanne Moron) 0.25 MG tablet Take 0.25 mg by mouth daily as needed (panic attacks).     Historical  Provider, MD  ciprofloxacin (CIPRO) 500 MG tablet Take 1 tablet (500 mg total) by mouth 2 (two) times daily. 04/02/15   Reyne Dumas, MD  HYDROcodone-acetaminophen (NORCO/VICODIN) 5-325 MG per tablet Take 2 tablets by mouth every 6 (six) hours as needed. 04/02/15   Reyne Dumas, MD  insulin aspart (NOVOLOG) 100 UNIT/ML injection Inject 3-12 Units into the skin See admin instructions. Inject 3-12 units per sliding scale subcutaneously 3-4 times daily with meals    Historical Provider, MD  insulin glargine (LANTUS) 100 UNIT/ML injection Inject 0.2 mLs (20 Units total) into the skin 2 (two) times daily. Patient taking differently: Inject 30 Units into the skin 2 (two) times daily.  10/07/13   Leanna Battles, MD  insulin lispro (HUMALOG) 100 UNIT/ML injection Inject 4-15 Units into the skin  6 (six) times daily. Per sliding scale Patient not taking: Reported on 03/10/2015 10/07/13   Leanna Battles, MD  mupirocin cream (BACTROBAN) 2 % Apply 1 application topically 2 (two) times daily. 12/18/15   Willeen Niece, MD   Triage Vitals: BP 134/84 mmHg  Pulse 137  Temp(Src) 98.3 F (36.8 C) (Oral)  Resp 18  SpO2 100%  LMP 02/10/2012   Physical Exam  Constitutional: She is oriented to person, place, and time. She appears well-developed and well-nourished. No distress.  Appears uncomfortable  HENT:  Head: Normocephalic and atraumatic.  Eyes: EOM are normal. Pupils are equal, round, and reactive to light.  Neck: Normal range of motion. Neck supple. No JVD present.  Cardiovascular: Regular rhythm and normal heart sounds.  Tachycardia present.   No murmur heard. Hyperdynamic   Pulmonary/Chest: Effort normal and breath sounds normal. She has no wheezes. She has no rales. She exhibits no tenderness.  Having kussmaul respirations   Abdominal: Soft. She exhibits no distension and no mass. There is no tenderness.  Bowel sounds are decreased   Musculoskeletal: Normal range of motion. She exhibits no edema or tenderness.  Lymphadenopathy:    She has no cervical adenopathy.  Neurological: She is alert and oriented to person, place, and time. No cranial nerve deficit. She exhibits normal muscle tone. Coordination normal.  Skin: Skin is warm and dry. No rash noted.  Psychiatric: She has a normal mood and affect. Her behavior is normal. Judgment and thought content normal.  Nursing note and vitals reviewed.   ED Course  Procedures (including critical care time)  DIAGNOSTIC STUDIES: Oxygen Saturation is 100% on RA, Normal by my interpretation.    COORDINATION OF CARE: 12:57 AM- Will order CBG, Lipase, CMP, CBC, urinalysis, i-stat beta hCG, and EKG. Discussed treatment plan with pt at bedside and pt agreed to plan.     Labs Review Results for orders placed or performed during the  hospital encounter of 01/31/16  Lipase, blood  Result Value Ref Range   Lipase 28 11 - 51 U/L  Comprehensive metabolic panel  Result Value Ref Range   Sodium 136 135 - 145 mmol/L   Potassium 4.8 3.5 - 5.1 mmol/L   Chloride 100 (L) 101 - 111 mmol/L   CO2 8 (L) 22 - 32 mmol/L   Glucose, Bld 476 (H) 65 - 99 mg/dL   BUN 20 6 - 20 mg/dL   Creatinine, Ser 1.58 (H) 0.44 - 1.00 mg/dL   Calcium 10.0 8.9 - 10.3 mg/dL   Total Protein 7.5 6.5 - 8.1 g/dL   Albumin 4.5 3.5 - 5.0 g/dL   AST 23 15 - 41 U/L   ALT 21 14 -  54 U/L   Alkaline Phosphatase 86 38 - 126 U/L   Total Bilirubin 1.9 (H) 0.3 - 1.2 mg/dL   GFR calc non Af Amer 40 (L) >60 mL/min   GFR calc Af Amer 46 (L) >60 mL/min   Anion gap 28 (H) 5 - 15  CBC  Result Value Ref Range   WBC 11.5 (H) 4.0 - 10.5 K/uL   RBC 5.28 (H) 3.87 - 5.11 MIL/uL   Hemoglobin 16.5 (H) 12.0 - 15.0 g/dL   HCT 47.8 (H) 36.0 - 46.0 %   MCV 90.5 78.0 - 100.0 fL   MCH 31.3 26.0 - 34.0 pg   MCHC 34.5 30.0 - 36.0 g/dL   RDW 12.5 11.5 - 15.5 %   Platelets 460 (H) 150 - 400 K/uL  Urinalysis, Routine w reflex microscopic (not at [facility])  Result Value Ref Range   Color, Urine YELLOW YELLOW   APPearance HAZY (A) CLEAR   Specific Gravity, Urine 1.027 1.005 - 1.030   pH 5.0 5.0 - 8.0   Glucose, UA >1000 (A) NEGATIVE mg/dL   Hgb urine dipstick NEGATIVE NEGATIVE   Bilirubin Urine NEGATIVE NEGATIVE   Ketones, ur >80 (A) NEGATIVE mg/dL   Protein, ur NEGATIVE NEGATIVE mg/dL   Nitrite NEGATIVE NEGATIVE   Leukocytes, UA NEGATIVE NEGATIVE  Troponin I  Result Value Ref Range   Troponin I 0.03 <0.031 ng/mL  Urine microscopic-add on  Result Value Ref Range   Squamous Epithelial / LPF 6-30 (A) NONE SEEN   WBC, UA 6-30 0 - 5 WBC/hpf   RBC / HPF 0-5 0 - 5 RBC/hpf   Bacteria, UA FEW (A) NONE SEEN   Urine-Other TRICHOMONAS PRESENT   Troponin I  Result Value Ref Range   Troponin I <0.03 <0.031 ng/mL  Basic metabolic panel  Result Value Ref Range   Sodium 145 135 -  145 mmol/L   Potassium 4.7 3.5 - 5.1 mmol/L   Chloride 118 (H) 101 - 111 mmol/L   CO2 8 (L) 22 - 32 mmol/L   Glucose, Bld 238 (H) 65 - 99 mg/dL   BUN 18 6 - 20 mg/dL   Creatinine, Ser 1.42 (H) 0.44 - 1.00 mg/dL   Calcium 8.1 (L) 8.9 - 10.3 mg/dL   GFR calc non Af Amer 45 (L) >60 mL/min   GFR calc Af Amer 52 (L) >60 mL/min   Anion gap 19 (H) 5 - 15  CBC with Differential  Result Value Ref Range   WBC 15.9 (H) 4.0 - 10.5 K/uL   RBC 4.61 3.87 - 5.11 MIL/uL   Hemoglobin 14.8 12.0 - 15.0 g/dL   HCT 42.4 36.0 - 46.0 %   MCV 92.0 78.0 - 100.0 fL   MCH 32.1 26.0 - 34.0 pg   MCHC 34.9 30.0 - 36.0 g/dL   RDW 12.7 11.5 - 15.5 %   Platelets 360 150 - 400 K/uL   Neutrophils Relative % 89 %   Neutro Abs 14.2 (H) 1.7 - 7.7 K/uL   Lymphocytes Relative 7 %   Lymphs Abs 1.1 0.7 - 4.0 K/uL   Monocytes Relative 4 %   Monocytes Absolute 0.6 0.1 - 1.0 K/uL   Eosinophils Relative 0 %   Eosinophils Absolute 0.0 0.0 - 0.7 K/uL   Basophils Relative 0 %   Basophils Absolute 0.0 0.0 - 0.1 K/uL  I-Stat beta hCG blood, ED ([facility], [facility], [facility] only)  Result Value Ref Range   I-stat hCG, quantitative <5.0 <5 mIU/mL  Comment 3          CBG monitoring, ED  Result Value Ref Range   Glucose-Capillary 463 (H) 65 - 99 mg/dL   Comment 1 Document in Chart   CBG monitoring, ED  Result Value Ref Range   Glucose-Capillary 566 (HH) 65 - 99 mg/dL   Comment 1 Notify RN   I-Stat venous blood gas, ED  Result Value Ref Range   pH, Ven 7.167 (LL) 7.250 - 7.300   pCO2, Ven 21.0 (L) 45.0 - 50.0 mmHg   pO2, Ven 47.0 (H) 30.0 - 45.0 mmHg   Bicarbonate 7.6 (L) 20.0 - 24.0 mEq/L   TCO2 8 0 - 100 mmol/L   O2 Saturation 73.0 %   Acid-base deficit 19.0 (H) 0.0 - 2.0 mmol/L   Patient temperature HIDE    Sample type VENOUS    Comment NOTIFIED PHYSICIAN   CBG monitoring, ED  Result Value Ref Range   Glucose-Capillary 369 (H) 65 - 99 mg/dL  CBG monitoring, ED  Result Value Ref Range   Glucose-Capillary 270 (H) 65 - 99 mg/dL   CBG monitoring, ED  Result Value Ref Range   Glucose-Capillary 219 (H) 65 - 99 mg/dL  CBG monitoring, ED  Result Value Ref Range   Glucose-Capillary 191 (H) 65 - 99 mg/dL    Imaging Review Dg Chest Port 1 View  01/31/2016  CLINICAL DATA:  40 year old female with diabetic ketoacidosis, abdominal pain and vomiting. EXAM: PORTABLE CHEST 1 VIEW COMPARISON:  Radiograph dated 03/31/2015 FINDINGS: Single-view of the chest does not demonstrate a focal consolidation. There is no significant pleural effusion or pneumothorax. The costophrenic angles have been excluded from the image. The cardiac silhouette is within normal limits. No acute osseous pathology. IMPRESSION: No active disease. Electronically Signed   By: Anner Crete M.D.   On: 01/31/2016 01:43   I have personally reviewed and evaluated these images and lab results as part of my medical decision-making.   EKG Interpretation   Date/Time:  Friday January 31 2016 00:53:54 EST Ventricular Rate:  128 PR Interval:  152 QRS Duration: 84 QT Interval:  301 QTC Calculation: 439 R Axis:   88 Text Interpretation:  Sinus tachycardia Probable anteroseptal infarct, old  When compared with ECG of 03/30/2015, No significant change was found  Confirmed by [facility]  MD, Kastiel Simonian ([id number]) on 01/31/2016 12:59:29 AM      CRITICAL CARE Performed by: [id number] Total critical care time: 50 minutes Critical care time was exclusive of separately billable procedures and treating other patients. Critical care was necessary to treat or prevent imminent or life-threatening deterioration. Critical care was time spent personally by me on the following activities: development of treatment plan with patient and/or surrogate as well as nursing, discussions with consultants, evaluation of patient's response to treatment, examination of patient, obtaining history from patient or surrogate, ordering and performing treatments and interventions, ordering and review of  laboratory studies, ordering and review of radiographic studies, pulse oximetry and re-evaluation of patient's condition. MDM   Final diagnoses:  Diabetic ketoacidosis without coma associated with type 1 diabetes mellitus (HCC)  Non-intractable vomiting with nausea, vomiting of unspecified type  Polycythemia  Acute kidney injury (nontraumatic) (HCC)    Diabetic ketoacidosis. Patient is started on aggressive IV hydration as well as started on insulin drip. Laboratory workup shows polycythemia which is probably secondary to find contraction. Also, mild elevation of creatinine compare with baseline is probably related to dehydration. Venous blood gas shows mild to moderate acidosis.  Case is discussed with Dr. Hal Hope have tried hospitalist agrees to admit the patient to stepdown unit. Old records reviewed in she does have prior admissions for ketoacidosis.  I personally performed the services described in this documentation, which was scribed in my presence. The recorded information has been reviewed and is accurate.      Delora Fuel, MD [id number]

## 2016-01-31 NOTE — H&P (Signed)
[facility] History and Physical  Sheryl White [id number] DOB: 1976/06/15 DOA: 01/31/2016  Referring physician: Dr. Roxanne Mins. PCP: Geoffery Lyons, MD  Specialists: None.  Chief Complaint: Nausea vomiting.  HPI: Sheryl White is a 40 y.o. female with known history of diabetes mellitus type 1 presents to the ER because of persistent nausea vomiting since morning. Denies any abdominal pain or diarrhea. In the ER patient was found to have elevated blood sugar with metabolic acidosis and anion gap. UA shows trichomoniasis. Patient has been admitted for diabetic ketoacidosis. Patient denies having missed her medications. Denies any chest pain or shortness of breath fever chills productive cough.   Review of Systems: As presented in the history of presenting illness, rest negative.  Past Medical History  Diagnosis Date  . Endometriosis   . Ovarian cyst   . Diabetes mellitus   . Angina   . GERD (gastroesophageal reflux disease)   . Anxiety   . Depression   . Dyslipidemia    Past Surgical History  Procedure Laterality Date  . Laparoscopic salpingoopherectomy    . Endometrial ablation    . Wisdom tooth extraction    . Tubal ligation    . Carpal tunnel release      bilateral  . Colonoscopy    . Upper gastrointestinal endoscopy    . Svd      x 1  . Cesarean section      x 1  . Abdominal hysterectomy      still have left ovary  . Trigger finger release      bilateral   Social History:  reports that she has been smoking Cigarettes.  She has a 5 pack-year smoking history. She has never used smokeless tobacco. She reports that she drinks alcohol. She reports that she does not use illicit drugs. Where does patient live home. Can patient participate in ADLs? Yes.  No Known Allergies  Family History:  Family History  Problem Relation Age of Onset  . Colon polyps Father   . Diabetes Son   . Diabetes Sister   . Cancer Father     ?      Prior to Admission  medications   Medication Sig Start Date End Date Taking? Authorizing Provider  ALPRAZolam Duanne Moron) 0.25 MG tablet Take 0.25 mg by mouth daily as needed (panic attacks).    Yes Historical Provider, MD  insulin aspart (NOVOLOG) 100 UNIT/ML injection Inject 3-12 Units into the skin See admin instructions. Inject 3-12 units per sliding scale subcutaneously 3-4 times daily with meals   Yes Historical Provider, MD  insulin glargine (LANTUS) 100 UNIT/ML injection Inject 0.2 mLs (20 Units total) into the skin 2 (two) times daily. Patient taking differently: Inject 30 Units into the skin 2 (two) times daily.  10/07/13  Yes Leanna Battles, MD    Physical Exam: Filed Vitals:   01/30/16 2132 01/31/16 0020  BP: 133/74 134/84  Pulse: 137 137  Temp: 98.3 F (36.8 C)   TempSrc: Oral   Resp: 18 18  SpO2: 100% 100%     General:  Moderately built and nourished.  Eyes: Anicteric no pallor.  ENT: No discharge from the ears eyes nose and mouth.  Neck: No mass felt.  Cardiovascular: S1-S2 heard.  Respiratory: No rhonchi or crepitations.  Abdomen: Soft nontender bowel sounds present. No guarding or rigidity.  Skin: No rash.  Musculoskeletal: No edema.  Psychiatric: Appears normal.  Neurologic: Alert awake oriented to time place and person. Moves all extremities.  Labs on Admission:  Basic Metabolic Panel:  Recent Labs Lab 01/30/16 2136  NA 136  K 4.8  CL 100*  CO2 8*  GLUCOSE 476*  BUN 20  CREATININE 1.58*  CALCIUM 10.0   Liver Function Tests:  Recent Labs Lab 01/30/16 2136  AST 23  ALT 21  ALKPHOS 86  BILITOT 1.9*  PROT 7.5  ALBUMIN 4.5    Recent Labs Lab 01/30/16 2136  LIPASE 28   No results for input(s): AMMONIA in the last 168 hours. CBC:  Recent Labs Lab 01/30/16 2136  WBC 11.5*  HGB 16.5*  HCT 47.8*  MCV 90.5  PLT 460*   Cardiac Enzymes:  Recent Labs Lab 01/31/16 0112  TROPONINI 0.03    BNP (last 3 results) No results for input(s): BNP in  the last 8760 hours.  ProBNP (last 3 results) No results for input(s): PROBNP in the last 8760 hours.  CBG:  Recent Labs Lab 01/30/16 2134 01/31/16 0051  GLUCAP 463* 566*    Radiological Exams on Admission: Dg Chest Port 1 View  01/31/2016  CLINICAL DATA:  40 year old female with diabetic ketoacidosis, abdominal pain and vomiting. EXAM: PORTABLE CHEST 1 VIEW COMPARISON:  Radiograph dated 03/31/2015 FINDINGS: Single-view of the chest does not demonstrate a focal consolidation. There is no significant pleural effusion or pneumothorax. The costophrenic angles have been excluded from the image. The cardiac silhouette is within normal limits. No acute osseous pathology. IMPRESSION: No active disease. Electronically Signed   By: Anner Crete M.D.   On: 01/31/2016 01:43   EKG - sinus tachycardia.  Assessment/Plan Principal Problem:   DKA (diabetic ketoacidoses) ([place]) Active Problems:   ARF (acute renal failure) (HCC)   Nausea & vomiting   DKA, type 1 ([place])   1. Diabetic ketoacidosis in type 1 diabetes - precipitating cause not clear. Patient has been started on IV insulin infusion and aggressive IV hydration. Has received 4 L normal skin bolus. Closely follow metabolic panel. Once metabolic acidosis gets corrected changed to long-acting insulin. 2. Acute renal failure - probably from dehydration from nausea vomiting. Continue with aggressive hydration and follow metabolic panel. Closely follow intake output. UA does not show any casts. 3. Trichomoniasis - patient has been placed on Flagyl. Partner will need to be advised to be treated. 4. Nausea vomiting - abdomen appears benign. Lipase and LFTs are within acceptable limits. Closely observe. 5. Sinus tachycardia - probably from dehydration. Will check TSH. Follow urine drug screen.   DVT Prophylaxis Lovenox.  Code Status: Full code.  Family Communication: Discussed with patient.  Disposition Plan: Admit to inpatient.     Jakhi Dishman N. [facility] Pager [phone].  If 7PM-7AM, please contact night-coverage [email or web address] Password [facility] 01/31/2016, 2:34 AM

## 2016-01-31 NOTE — Progress Notes (Signed)
Utilization review completed. Latanya Hemmer, RN, BSN. 

## 2016-01-31 NOTE — Care Management Note (Signed)
Case Management Note  Patient Details  Name: SHAKTHI JANUS MRN: [id number] Date of Birth: March 31, 1976  Subjective/Objective:      Pt is in with DKA              Action/Plan:  Pt is independent from home with boyfriend, plan is to discharge back home  - no CM needs determined at this time.  CM will continue to monitor for disposition needs   Expected Discharge Date:                  Expected Discharge Plan:  Home/Self Care  In-House Referral:     Discharge planning Services  CM Consult  Post Acute Care Choice:    Choice offered to:     DME Arranged:    DME Agency:     HH Arranged:    HH Agency:     Status of Service:  In process, will continue to follow  Medicare Important Message Given:    Date Medicare IM Given:    Medicare IM give by:    Date Additional Medicare IM Given:    Additional Medicare Important Message give by:     If discussed at [place] of Stay Meetings, dates discussed:    Additional Comments:  Maryclare Labrador, RN 01/31/2016, 1:36 PM

## 2016-02-01 DIAGNOSIS — N179 Acute kidney failure, unspecified: Secondary | ICD-10-CM

## 2016-02-01 DIAGNOSIS — R112 Nausea with vomiting, unspecified: Secondary | ICD-10-CM

## 2016-02-01 DIAGNOSIS — E1010 Type 1 diabetes mellitus with ketoacidosis without coma: Principal | ICD-10-CM

## 2016-02-01 LAB — BASIC METABOLIC PANEL
ANION GAP: 7 (ref 5–15)
BUN: <5 mg/dL — ABNORMAL LOW (ref 6–20)
CO2: 19 mmol/L — AB (ref 22–32)
Calcium: 8.1 mg/dL — ABNORMAL LOW (ref 8.9–10.3)
Chloride: 111 mmol/L (ref 101–111)
Creatinine, Ser: 0.72 mg/dL (ref 0.44–1.00)
GFR calc Af Amer: >60 mL/min (ref >60)
GFR calc non Af Amer: >60 mL/min (ref >60)
GLUCOSE: 92 mg/dL (ref 65–99)
POTASSIUM: 3 mmol/L — AB (ref 3.5–5.1)
Sodium: 137 mmol/L (ref 135–145)

## 2016-02-01 LAB — GLUCOSE, CAPILLARY
Glucose-Capillary: 120 mg/dL — ABNORMAL HIGH (ref 65–99)
Glucose-Capillary: 87 mg/dL (ref 65–99)

## 2016-02-01 LAB — T4, FREE: Free T4: 0.94 ng/dL (ref 0.61–1.12)

## 2016-02-01 MED ORDER — INFLUENZA VAC SPLIT QUAD 0.5 ML IM SUSY
0.5000 mL | PREFILLED_SYRINGE | Freq: Once | INTRAMUSCULAR | Status: AC
Start: 2016-02-01 — End: 2016-02-01
  Administered 2016-02-01: 0.5 mL via INTRAMUSCULAR
  Filled 2016-02-01: qty 0.5

## 2016-02-01 MED ORDER — METRONIDAZOLE 500 MG PO TABS: 500.0000 mg | ORAL_TABLET | Freq: Two times a day (BID) | ORAL | Status: DC

## 2016-02-01 MED ORDER — POTASSIUM CHLORIDE CRYS ER 20 MEQ PO TBCR
30.0000 meq | EXTENDED_RELEASE_TABLET | ORAL | Status: DC
  Administered 2016-02-01: 30 meq via ORAL
  Filled 2016-02-01 (×2): qty 1

## 2016-02-01 MED ORDER — PNEUMOCOCCAL VAC POLYVALENT 25 MCG/0.5ML IJ INJ
0.5000 mL | INJECTION | Freq: Once | INTRAMUSCULAR | Status: AC
  Administered 2016-02-01: 0.5 mL via INTRAMUSCULAR
  Filled 2016-02-01: qty 0.5

## 2016-02-01 MED ORDER — POTASSIUM CHLORIDE CRYS ER 20 MEQ PO TBCR
40.0000 meq | EXTENDED_RELEASE_TABLET | Freq: Once | ORAL | Status: AC
  Administered 2016-02-01: 40 meq via ORAL
  Filled 2016-02-01: qty 2

## 2016-02-01 MED ORDER — ACETAMINOPHEN 325 MG PO TABS
650.0000 mg | ORAL_TABLET | Freq: Once | ORAL | Status: AC
  Administered 2016-02-01: 650 mg via ORAL
  Filled 2016-02-01: qty 2

## 2016-02-01 NOTE — Progress Notes (Signed)
Discharge instructions reviewed with patient.patient vervalized understanding.Belongings sent home with patient. Prescription given

## 2016-02-01 NOTE — Progress Notes (Signed)
Patient dcd> home with her boyfriend

## 2016-02-01 NOTE — Discharge Summary (Signed)
Physician Discharge Summary  Sheryl White [id number] DOB: Jan 30, 1976 DOA: 01/31/2016  PCP: Geoffery Lyons, MD  Admit date: 01/31/2016 Discharge date: 02/01/2016  Time spent: 20 minutes  Recommendations for Outpatient Follow-up:  1. Follow up with PCP in 1-2 weeks 2. Please ensure sexual partner is treated for trichomoniasis. Patient was instructed to notify partner(s) to be treated  Discharge Diagnoses:  Principal Problem:   DKA (diabetic ketoacidoses) ([place]) Active Problems:   ARF (acute renal failure) ([facility])   Nausea & vomiting   DKA, type 1 ([place])   Discharge Condition: Imrpoved  Diet recommendation: Diabetic  Filed Weights   01/31/16 0715  Weight: 63.6 kg (140 lb 3.4 oz)    History of present illness:  Please review h and p from 2/17 for details. Briefly, 40 y.o. female with known history of diabetes mellitus type 1 presents to the ER because of persistent nausea vomiting since the morning of admission, found to be in florid DKA. Patient was admitted for further work up. Of note, patient was found to be pos for trichomonas .  Hospital Course:  1. Diabetic ketoacidosis in type 1 diabetes - possibly precipitated by Trichomoniasis?. Patient has been started on IV insulin infusion and aggressive IV hydration with insulin gtt. DKA has resolved and have transitioned to home subQ insulin. Glucose remained stable 2. Acute renal failure - probably from dehydration from nausea vomiting with DKA. Normalized with IVF hydration 3. Trichomoniasis - patient has been placed on Flagyl. Advised patient to have partner(s) treated. 4. Nausea vomiting - Lipase and LFTs are within acceptable limits. Closely observe. Likely secondary to DKA. Resolved 5. Sinus tachycardia - probably from dehydration. TSH 0.275. Will check free T4 in AM  Discharge Exam: Filed Vitals:   02/01/16 0700 02/01/16 0800 02/01/16 0806 02/01/16 0900  BP:   110/71   Pulse: 76 80  90  Temp:   98.3 F (36.8 C)    TempSrc:   Oral   Resp: 0 17  14  Height:      Weight:      SpO2: 97% 99%  99%    General: Awake, in nad Cardiovascular: regular, s1, s2 Respiratory: normal resp effort, no wheezing  Discharge Instructions     Medication List    TAKE these medications        ALPRAZolam 0.25 MG tablet  Commonly known as:  XANAX  Take 0.25 mg by mouth daily as needed (panic attacks).     insulin aspart 100 UNIT/ML injection  Commonly known as:  novoLOG  Inject 3-12 Units into the skin See admin instructions. Inject 3-12 units per sliding scale subcutaneously 3-4 times daily with meals     insulin glargine 100 UNIT/ML injection  Commonly known as:  LANTUS  Inject 0.2 mLs (20 Units total) into the skin 2 (two) times daily.     metroNIDAZOLE 500 MG tablet  Commonly known as:  FLAGYL  Take 1 tablet (500 mg total) by mouth 2 (two) times daily.       No Known Allergies Follow-up Information    Follow up with ARONSON,RICHARD A, MD. Schedule an appointment as soon as possible for a visit in 2 weeks.   Specialty:  Internal Medicine   Why:  Hospital follow up   Contact information:   [address] [place] [address] [phone]        The results of significant diagnostics from this hospitalization (including imaging, microbiology, ancillary and laboratory) are listed below for reference.  Significant Diagnostic Studies: Dg Chest Port 1 View  01/31/2016  CLINICAL DATA:  40 year old female with diabetic ketoacidosis, abdominal pain and vomiting. EXAM: PORTABLE CHEST 1 VIEW COMPARISON:  Radiograph dated 03/31/2015 FINDINGS: Single-view of the chest does not demonstrate a focal consolidation. There is no significant pleural effusion or pneumothorax. The costophrenic angles have been excluded from the image. The cardiac silhouette is within normal limits. No acute osseous pathology. IMPRESSION: No active disease. Electronically Signed   By: Anner Crete M.D.   On: 01/31/2016 01:43     Microbiology: Recent Results (from the past 240 hour(s))  MRSA PCR Screening     Status: None   Collection Time: 01/31/16  7:30 AM  Result Value Ref Range Status   MRSA by PCR NEGATIVE NEGATIVE Final    Comment:        The GeneXpert MRSA Assay (FDA approved for NASAL specimens only), is one component of a comprehensive MRSA colonization surveillance program. It is not intended to diagnose MRSA infection nor to guide or monitor treatment for MRSA infections.      Labs: Basic Metabolic Panel:  Recent Labs Lab 01/30/16 2136 01/31/16 0446 01/31/16 1035 01/31/16 1436 02/01/16 0245  NA 136 145 140 136 137  K 4.8 4.7 4.0 3.7 3.0*  CL 100* 118* 116* 112* 111  CO2 8* 8* 13* 12* 19*  GLUCOSE 476* 238* 141* 131* 92  BUN 20 18 10 8  <5*  CREATININE 1.58* 1.42* 1.05* 0.97 0.72  CALCIUM 10.0 8.1* 7.8* 7.8* 8.1*   Liver Function Tests:  Recent Labs Lab 01/30/16 2136  AST 23  ALT 21  ALKPHOS 86  BILITOT 1.9*  PROT 7.5  ALBUMIN 4.5    Recent Labs Lab 01/30/16 2136  LIPASE 28   No results for input(s): AMMONIA in the last 168 hours. CBC:  Recent Labs Lab 01/30/16 2136 01/31/16 0446  WBC 11.5* 15.9*  NEUTROABS  --  14.2*  HGB 16.5* 14.8  HCT 47.8* 42.4  MCV 90.5 92.0  PLT 460* 360   Cardiac Enzymes:  Recent Labs Lab 01/31/16 0112 01/31/16 0446  TROPONINI 0.03 <0.03   BNP: BNP (last 3 results) No results for input(s): BNP in the last 8760 hours.  ProBNP (last 3 results) No results for input(s): PROBNP in the last 8760 hours.  CBG:  Recent Labs Lab 01/31/16 1346 01/31/16 1435 01/31/16 1533 01/31/16 1723 02/01/16 0807  GLUCAP 95 112* 124* 128* 87   Signed:  Hezakiah Champeau K  [facility] 02/01/2016, 6:14 PM

## 2016-04-25 ENCOUNTER — Inpatient Hospital Stay
Admission: EM | Admit: 2016-04-25 | Discharge: 2016-04-26 | DRG: 639 | Disposition: A | Discharge status: Home / Self Care | Payer: Medicaid Other | Attending: Internal Medicine | Admitting: Internal Medicine

## 2016-04-25 ENCOUNTER — Encounter: Payer: Self-pay | Admitting: *Deleted

## 2016-04-25 DIAGNOSIS — Z833 Family history of diabetes mellitus: Secondary | ICD-10-CM | POA: Diagnosis not present

## 2016-04-25 DIAGNOSIS — K219 Gastro-esophageal reflux disease without esophagitis: Secondary | ICD-10-CM | POA: Diagnosis present

## 2016-04-25 DIAGNOSIS — F419 Anxiety disorder, unspecified: Secondary | ICD-10-CM | POA: Diagnosis present

## 2016-04-25 DIAGNOSIS — Z79899 Other long term (current) drug therapy: Secondary | ICD-10-CM | POA: Diagnosis not present

## 2016-04-25 DIAGNOSIS — E1010 Type 1 diabetes mellitus with ketoacidosis without coma: Principal | ICD-10-CM | POA: Diagnosis present

## 2016-04-25 DIAGNOSIS — E1110 Type 2 diabetes mellitus with ketoacidosis without coma: Secondary | ICD-10-CM | POA: Diagnosis present

## 2016-04-25 DIAGNOSIS — F329 Major depressive disorder, single episode, unspecified: Secondary | ICD-10-CM | POA: Diagnosis present

## 2016-04-25 DIAGNOSIS — Z809 Family history of malignant neoplasm, unspecified: Secondary | ICD-10-CM | POA: Diagnosis not present

## 2016-04-25 DIAGNOSIS — E785 Hyperlipidemia, unspecified: Secondary | ICD-10-CM | POA: Diagnosis present

## 2016-04-25 DIAGNOSIS — Z9119 Patient's noncompliance with other medical treatment and regimen: Secondary | ICD-10-CM | POA: Diagnosis not present

## 2016-04-25 DIAGNOSIS — Z8371 Family history of colonic polyps: Secondary | ICD-10-CM

## 2016-04-25 DIAGNOSIS — F17210 Nicotine dependence, cigarettes, uncomplicated: Secondary | ICD-10-CM | POA: Diagnosis present

## 2016-04-25 LAB — GLUCOSE, CAPILLARY
GLUCOSE-CAPILLARY: 429 mg/dL — AB (ref 65–99)
GLUCOSE-CAPILLARY: 298 mg/dL — AB (ref 65–99)
GLUCOSE-CAPILLARY: 275 mg/dL — AB (ref 65–99)
GLUCOSE-CAPILLARY: 207 mg/dL — AB (ref 65–99)
GLUCOSE-CAPILLARY: 171 mg/dL — AB (ref 65–99)
Glucose-Capillary: 573 mg/dL (ref 65–99)
Glucose-Capillary: 501 mg/dL — ABNORMAL HIGH (ref 65–99)
Glucose-Capillary: 270 mg/dL — ABNORMAL HIGH (ref 65–99)
Glucose-Capillary: 136 mg/dL — ABNORMAL HIGH (ref 65–99)
Glucose-Capillary: 121 mg/dL — ABNORMAL HIGH (ref 65–99)
Glucose-Capillary: 135 mg/dL — ABNORMAL HIGH (ref 65–99)

## 2016-04-25 LAB — URINALYSIS COMPLETE WITH MICROSCOPIC (ARMC ONLY)
Bacteria, UA: NONE SEEN
Bilirubin Urine: NEGATIVE
Glucose, UA: >500 mg/dL — AB
Hgb urine dipstick: NEGATIVE
Leukocytes, UA: NEGATIVE
Nitrite: NEGATIVE
Protein, ur: NEGATIVE mg/dL
Specific Gravity, Urine: 1.018 (ref 1.005–1.030)
WBC, UA: NONE SEEN WBC/hpf (ref 0–5)
pH: 5 (ref 5.0–8.0)

## 2016-04-25 LAB — CBC
HEMATOCRIT: 42.4 % (ref 35.0–47.0)
HEMOGLOBIN: 13.8 g/dL (ref 12.0–16.0)
MCH: 31 pg (ref 26.0–34.0)
MCHC: 32.6 g/dL (ref 32.0–36.0)
MCV: 95.3 fL (ref 80.0–100.0)
Platelets: 381 10*3/uL (ref 150–440)
RBC: 4.45 MIL/uL (ref 3.80–5.20)
RDW: 12.6 % (ref 11.5–14.5)
WBC: 24.8 10*3/uL — ABNORMAL HIGH (ref 3.6–11.0)

## 2016-04-25 LAB — BASIC METABOLIC PANEL WITH GFR
Anion gap: 26 — ABNORMAL HIGH (ref 5–15)
BUN: 20 mg/dL (ref 6–20)
CO2: 8 mmol/L — ABNORMAL LOW (ref 22–32)
Calcium: 8.8 mg/dL — ABNORMAL LOW (ref 8.9–10.3)
Chloride: 101 mmol/L (ref 101–111)
Creatinine, Ser: 1.21 mg/dL — ABNORMAL HIGH (ref 0.44–1.00)
GFR calc Af Amer: >60 mL/min
GFR calc non Af Amer: 55 mL/min — ABNORMAL LOW
Glucose, Bld: 646 mg/dL (ref 65–99)
Potassium: 5.1 mmol/L (ref 3.5–5.1)
Sodium: 135 mmol/L (ref 135–145)

## 2016-04-25 LAB — BASIC METABOLIC PANEL
Anion gap: 16 — ABNORMAL HIGH (ref 5–15)
Anion gap: 12 (ref 5–15)
BUN: 18 mg/dL (ref 6–20)
BUN: 16 mg/dL (ref 6–20)
CALCIUM: 8.5 mg/dL — AB (ref 8.9–10.3)
CHLORIDE: 116 mmol/L — AB (ref 101–111)
CHLORIDE: 119 mmol/L — AB (ref 101–111)
CO2: 10 mmol/L — AB (ref 22–32)
CO2: 13 mmol/L — ABNORMAL LOW (ref 22–32)
CREATININE: 1.11 mg/dL — AB (ref 0.44–1.00)
CREATININE: 0.94 mg/dL (ref 0.44–1.00)
Calcium: 8.4 mg/dL — ABNORMAL LOW (ref 8.9–10.3)
GFR calc non Af Amer: >60 mL/min (ref >60)
GFR calc non Af Amer: >60 mL/min (ref >60)
GLUCOSE: 359 mg/dL — AB (ref 65–99)
Glucose, Bld: 151 mg/dL — ABNORMAL HIGH (ref 65–99)
Potassium: 4.6 mmol/L (ref 3.5–5.1)
Potassium: 4.1 mmol/L (ref 3.5–5.1)
SODIUM: 144 mmol/L (ref 135–145)
Sodium: 142 mmol/L (ref 135–145)

## 2016-04-25 LAB — HCG, QUANTITATIVE, PREGNANCY: hCG, Beta Chain, Quant, S: <1 m[IU]/mL (ref <5)

## 2016-04-25 LAB — OCCULT BLOOD GASTRIC / DUODENUM (SPECIMEN CUP)
OCCULT BLOOD, GASTRIC: POSITIVE — AB
pH, Gastric: 2

## 2016-04-25 LAB — POCT PREGNANCY, URINE: PREG TEST UR: NEGATIVE

## 2016-04-25 LAB — MRSA PCR SCREENING: MRSA by PCR: NEGATIVE

## 2016-04-25 MED ORDER — ONDANSETRON HCL 4 MG/2ML IJ SOLN
4.0000 mg | Freq: Once | INTRAMUSCULAR | Status: AC
  Administered 2016-04-25: 4 mg via INTRAVENOUS
  Filled 2016-04-25: qty 2

## 2016-04-25 MED ORDER — INSULIN ASPART 100 UNIT/ML ~~LOC~~ SOLN
10.0000 [IU] | Freq: Once | SUBCUTANEOUS | Status: DC
  Filled 2016-04-25: qty 10

## 2016-04-25 MED ORDER — HEPARIN SODIUM (PORCINE) 5000 UNIT/ML IJ SOLN
5000.0000 [IU] | Freq: Three times a day (TID) | INTRAMUSCULAR | Status: DC
  Administered 2016-04-25 – 2016-04-26 (×3): 5000 [IU] via SUBCUTANEOUS
  Filled 2016-04-25 (×3): qty 1

## 2016-04-25 MED ORDER — SODIUM CHLORIDE 0.9 % IV SOLN
INTRAVENOUS | Status: DC
  Administered 2016-04-25: 14:00:00 via INTRAVENOUS
  Filled 2016-04-25: qty 2.5

## 2016-04-25 MED ORDER — INSULIN ASPART 100 UNIT/ML ~~LOC~~ SOLN
10.0000 [IU] | Freq: Once | SUBCUTANEOUS | Status: AC
  Administered 2016-04-25: 10 [IU] via INTRAVENOUS

## 2016-04-25 MED ORDER — SODIUM CHLORIDE 0.9 % IV BOLUS (SEPSIS)
1000.0000 mL | Freq: Once | INTRAVENOUS | Status: AC
  Administered 2016-04-25: 1000 mL via INTRAVENOUS

## 2016-04-25 MED ORDER — DEXTROSE-NACL 5-0.45 % IV SOLN
INTRAVENOUS | Status: DC
  Administered 2016-04-25 – 2016-04-26 (×2): via INTRAVENOUS

## 2016-04-25 MED ORDER — ACETAMINOPHEN 325 MG PO TABS
650.0000 mg | ORAL_TABLET | Freq: Four times a day (QID) | ORAL | Status: DC | PRN
  Administered 2016-04-25 – 2016-04-26 (×2): 650 mg via ORAL
  Filled 2016-04-25 (×2): qty 2

## 2016-04-25 MED ORDER — HEPARIN SODIUM (PORCINE) 5000 UNIT/ML IJ SOLN: 5000.0000 [IU] | Freq: Three times a day (TID) | INTRAMUSCULAR | Status: DC

## 2016-04-25 MED ORDER — SODIUM CHLORIDE 0.9 % IV SOLN
INTRAVENOUS | Status: DC
  Administered 2016-04-25: 3.7 [IU]/h via INTRAVENOUS
  Filled 2016-04-25: qty 2.5

## 2016-04-25 MED ORDER — SODIUM CHLORIDE 0.9 % IV SOLN
INTRAVENOUS | Status: DC
  Administered 2016-04-25: 15:00:00 via INTRAVENOUS

## 2016-04-25 NOTE — H&P (Signed)
[facility] [place] [facility] NAME: Sheryl White    MR#:  [id number]  DATE OF BIRTH:  01-25-1976  DATE OF ADMISSION:  04/25/2016  PRIMARY CARE PHYSICIAN: Geoffery Lyons, MD   REQUESTING/REFERRING PHYSICIAN: McShane   CHIEF COMPLAINT:   Chief Complaint  Patient presents with  . Hyperglycemia    HISTORY OF PRESENT ILLNESS: Sheryl White  is a 40 y.o. female with a known history of DM -1, Missed some Insulin doses, as she forgot. Started vomiting last night, and brought by her mother- FOund to be inDKA with high anion gap. Given 2 ltr bolus IV and started on Insulin Drip.   PAST MEDICAL HISTORY:   Past Medical History  Diagnosis Date  . Endometriosis   . Ovarian cyst   . Diabetes mellitus   . Angina   . GERD (gastroesophageal reflux disease)   . Anxiety   . Depression   . Dyslipidemia     PAST SURGICAL HISTORY: Past Surgical History  Procedure Laterality Date  . Laparoscopic salpingoopherectomy    . Endometrial ablation    . Wisdom tooth extraction    . Tubal ligation    . Carpal tunnel release      bilateral  . Colonoscopy    . Upper gastrointestinal endoscopy    . Svd      x 1  . Cesarean section      x 1  . Abdominal hysterectomy      still have left ovary  . Trigger finger release      bilateral    SOCIAL HISTORY:  Social History  Substance Use Topics  . Smoking status: Current Every Day Smoker -- 1.00 packs/day for 5 years    Types: Cigarettes  . Smokeless tobacco: Never Used  . Alcohol Use: 0.0 oz/week    2-3 drink(s) per week     Comment: occasionally    FAMILY HISTORY:  Family History  Problem Relation Age of Onset  . Colon polyps Father   . Diabetes Son   . Diabetes Sister   . Cancer Father     ?    DRUG ALLERGIES: No Known Allergies  REVIEW OF SYSTEMS:   CONSTITUTIONAL: No fever, fatigue or weakness.  EYES: No blurred or double vision.  EARS, NOSE, AND THROAT: No tinnitus or ear pain.   RESPIRATORY: No cough, shortness of breath, wheezing or hemoptysis.  CARDIOVASCULAR: No chest pain, orthopnea, edema.  GASTROINTESTINAL: positive for nausea, vomiting, no diarrhea or abdominal pain.  GENITOURINARY: No dysuria, hematuria.  ENDOCRINE: No polyuria, nocturia,  HEMATOLOGY: No anemia, easy bruising or bleeding SKIN: No rash or lesion. MUSCULOSKELETAL: No joint pain or arthritis.   NEUROLOGIC: No tingling, numbness, weakness.  PSYCHIATRY: No anxiety or depression.   MEDICATIONS AT HOME:  Prior to Admission medications   Medication Sig Start Date End Date Taking? Authorizing Provider  ALPRAZolam Duanne Moron) 0.25 MG tablet Take 0.25 mg by mouth daily as needed (panic attacks).     Historical Provider, MD  insulin aspart (NOVOLOG) 100 UNIT/ML injection Inject 3-12 Units into the skin See admin instructions. Inject 3-12 units per sliding scale subcutaneously 3-4 times daily with meals    Historical Provider, MD  insulin glargine (LANTUS) 100 UNIT/ML injection Inject 0.2 mLs (20 Units total) into the skin 2 (two) times daily. Patient taking differently: Inject 30 Units into the skin 2 (two) times daily.  10/07/13   Leanna Battles, MD  metroNIDAZOLE (FLAGYL) 500 MG tablet Take 1  tablet (500 mg total) by mouth 2 (two) times daily. 02/01/16   Donne Hazel, MD      PHYSICAL EXAMINATION:   VITAL SIGNS: Blood pressure 117/61, pulse 133, temperature 97.5 F (36.4 C), temperature source Oral, resp. rate 24, weight 68.96 kg (152 lb 0.5 oz), last menstrual period 02/10/2012, SpO2 100 %.  GENERAL:  40 y.o.-year-old patient lying in the bed with no acute distress.  EYES: Pupils equal, round, reactive to light and accommodation. No scleral icterus. Extraocular muscles intact.  HEENT: Head atraumatic, normocephalic. Oropharynx and nasopharynx clear.  NECK:  Supple, no jugular venous distention. No thyroid enlargement, no tenderness.  LUNGS: Normal breath sounds bilaterally, no wheezing,  rales,rhonchi or crepitation. No use of accessory muscles of respiration.  CARDIOVASCULAR: S1, S2 fast and regular. No murmurs, rubs, or gallops.  ABDOMEN: Soft, nontender, nondistended. Bowel sounds present. No organomegaly or mass.  EXTREMITIES: No pedal edema, cyanosis, or clubbing.  NEUROLOGIC: Cranial nerves II through XII are intact. Muscle strength 5/5 in all extremities. Sensation intact. Gait not checked.  PSYCHIATRIC: The patient is alert and oriented x 3.  SKIN: No obvious rash, lesion, or ulcer.   LABORATORY PANEL:   CBC No results for input(s): WBC, HGB, HCT, PLT, MCV, MCH, MCHC, RDW, LYMPHSABS, MONOABS, EOSABS, BASOSABS, BANDABS in the last 168 hours.  Invalid input(s): NEUTRABS, BANDSABD ------------------------------------------------------------------------------------------------------------------  Chemistries   Recent Labs Lab 04/25/16 1231  NA 135  K 5.1  CL 101  CO2 8*  GLUCOSE 646*  BUN 20  CREATININE 1.21*  CALCIUM 8.8*   ------------------------------------------------------------------------------------------------------------------ estimated creatinine clearance is 57.3 mL/min (by C-G formula based on Cr of 1.21). ------------------------------------------------------------------------------------------------------------------ No results for input(s): TSH, T4TOTAL, T3FREE, THYROIDAB in the last 72 hours.  Invalid input(s): FREET3   Coagulation profile No results for input(s): INR, PROTIME in the last 168 hours. ------------------------------------------------------------------------------------------------------------------- No results for input(s): DDIMER in the last 72 hours. -------------------------------------------------------------------------------------------------------------------  Cardiac Enzymes No results for input(s): CKMB, TROPONINI, MYOGLOBIN in the last 168 hours.  Invalid input(s):  CK ------------------------------------------------------------------------------------------------------------------ Invalid input(s): POCBNP  ---------------------------------------------------------------------------------------------------------------  Urinalysis    Component Value Date/Time   COLORURINE YELLOW 01/31/2016 0056   APPEARANCEUR HAZY* 01/31/2016 0056   LABSPEC 1.027 01/31/2016 0056   PHURINE 5.0 01/31/2016 0056   GLUCOSEU >1000* 01/31/2016 0056   HGBUR NEGATIVE 01/31/2016 0056   BILIRUBINUR NEGATIVE 01/31/2016 0056   KETONESUR >80* 01/31/2016 0056   PROTEINUR NEGATIVE 01/31/2016 0056   UROBILINOGEN 0.2 03/30/2015 1930   NITRITE NEGATIVE 01/31/2016 0056   LEUKOCYTESUR NEGATIVE 01/31/2016 0056     RADIOLOGY: No results found.  EKG: Orders placed or performed during the hospital encounter of 01/31/16  . EKG 12-Lead  . EKG 12-Lead  . EKG    IMPRESSION AND PLAN:  * DKA   IV insulin drip, monitor serial BMP.   Check for infection.  * Anxiety   Held Xanax for now.   All the records are reviewed and case discussed with ED provider. Management plans discussed with the patient, family and they are in agreement.  CODE STATUS: Full code.    Code Status Orders        Start     Ordered   04/25/16 1410  Full code   Continuous     04/25/16 1416    Code Status History    Date Active Date Inactive Code Status Order ID Comments User Context   01/31/2016  2:34 AM 02/01/2016  2:32 PM Full Code [id number]  Rise Patience, MD  ED   03/31/2015  1:21 AM 04/02/2015  3:05 PM Full Code [id number]  Jani Gravel, MD Inpatient   10/29/2013 10:36 AM 10/30/2013  2:41 PM Full Code [id number]  Geoffery Lyons, MD Inpatient   10/07/2013  3:48 AM 10/07/2013 10:32 PM Full Code [id number]  Leanna Battles, MD Inpatient   02/19/2012 12:24 PM 02/20/2012  1:03 AM Full Code [id number]  Gerald Leitz, RN Inpatient       TOTAL TIME TAKING CARE OF THIS PATIENT: 50 critical  care minutes.  Her mother present in room at time of admission, and help me with details of the  Case.  Vaughan Basta M.D on 04/25/2016   Between 7am to 6pm - Pager - [phone]  After 6pm go to [email or web address] - password [email or web address] [facility]  Office  [phone]  CC: Primary care physician; Geoffery Lyons, MD   Note: This dictation was prepared with Dragon dictation along with smaller phrase technology. Any transcriptional errors that result from this process are unintentional.

## 2016-04-25 NOTE — Progress Notes (Signed)
Spoke with Dr Doy Hutching regarding patient being lethargic but easily arousable. Heart rate has been in the 120-130's. Maint. Fluids are running and patient is not having any complaints of pain. At this point MD ordered to continue to monitor patient.

## 2016-04-25 NOTE — ED Provider Notes (Signed)
[facility] Emergency Department Provider Note  ____________________________________________   I have reviewed the triage vital signs and the nursing notes.   HISTORY  Chief Complaint Hyperglycemia    HPI Sheryl White is a 40 y.o. female patient is an insulin-dependent type 1 diabetic, who has a history of DKA and noncompliance, states she did not take her insulin today or yesterday. No antecedent fever or chills. Complains only of what she believes to be DKA. Vomiting. No abdominal pain. She states she did not run out of her insulin she just does not take it. No headache no fever no chest pain or cough no diarrhea no melena bright red blood per rectum. Denies chest pain.     Past Medical History  Diagnosis Date  . Endometriosis   . Ovarian cyst   . Diabetes mellitus   . Angina   . GERD (gastroesophageal reflux disease)   . Anxiety   . Depression   . Dyslipidemia     Patient Active Problem List   Diagnosis Date Noted  . ARF (acute renal failure) ([place]) 01/31/2016  . Nausea & vomiting 01/31/2016  . DKA, type 1 ([place]) 01/31/2016  . Emesis   . DKA (diabetic ketoacidoses) ([place]) 03/31/2015  . UTI (lower urinary tract infection) 03/30/2015  . Diabetic ketoacidosis, type I ([place]) 10/07/2013    Class: Acute  . Abdominal pain, right lower quadrant 01/09/2012  . CHEST PAIN UNSPECIFIED 11/18/2009  . ABDOMINAL PAIN, EPIGASTRIC 09/12/2009  . DM (diabetes mellitus), type 1 ([place]) 06/01/2008  . DM 05/31/2008  . DYSLIPIDEMIA 05/31/2008  . DEPRESSION 05/31/2008  . GERD 05/31/2008  . RECTAL BLEEDING 05/31/2008  . BLOOD IN STOOL 05/31/2008  . DIARRHEA 05/31/2008    Past Surgical History  Procedure Laterality Date  . Laparoscopic salpingoopherectomy    . Endometrial ablation    . Wisdom tooth extraction    . Tubal ligation    . Carpal tunnel release      bilateral  . Colonoscopy    . Upper gastrointestinal endoscopy    . Svd      x 1  . Cesarean  section      x 1  . Abdominal hysterectomy      still have left ovary  . Trigger finger release      bilateral    Current Outpatient Rx  Name  Route  Sig  Dispense  Refill  . ALPRAZolam (XANAX) 0.25 MG tablet   Oral   Take 0.25 mg by mouth daily as needed (panic attacks).          . insulin aspart (NOVOLOG) 100 UNIT/ML injection   Subcutaneous   Inject 3-12 Units into the skin See admin instructions. Inject 3-12 units per sliding scale subcutaneously 3-4 times daily with meals         . insulin glargine (LANTUS) 100 UNIT/ML injection   Subcutaneous   Inject 0.2 mLs (20 Units total) into the skin 2 (two) times daily. Patient taking differently: Inject 22 Units into the skin 2 (two) times daily.    10 mL   12     Give 20 units at bedtime and in the morning, until ...     Allergies Review of patient's allergies indicates no known allergies.  Family History  Problem Relation Age of Onset  . Colon polyps Father   . Diabetes Son   . Diabetes Sister   . Cancer Father     ?    Social History Social  History  Substance Use Topics  . Smoking status: Current Every Day Smoker -- 1.00 packs/day for 5 years    Types: Cigarettes  . Smokeless tobacco: Never Used  . Alcohol Use: 0.0 oz/week    2-3 drink(s) per week     Comment: occasionally    Review of Systems Constitutional: No fever/chills Eyes: No visual changes. ENT: No sore throat. No stiff neck no neck pain Cardiovascular: Denies chest pain. Respiratory: Denies shortness of breath. Gastrointestinal:   Positive vomiting.  No diarrhea.  No constipation. Genitourinary: Negative for dysuria. Musculoskeletal: Negative lower extremity swelling Skin: Negative for rash. Neurological: Negative for headaches, focal weakness or numbness. 10-point ROS otherwise negative.  ____________________________________________   PHYSICAL EXAM:  VITAL SIGNS: ED Triage Vitals  Enc Vitals Group     BP 04/25/16 1222 132/70 mmHg      Pulse Rate 04/25/16 1222 128     Resp 04/25/16 1222 20     Temp 04/25/16 1222 97.5 F (36.4 C)     Temp Source 04/25/16 1222 Oral     SpO2 04/25/16 1222 100 %     Weight 04/25/16 1222 152 lb 0.5 oz (68.96 kg)     Height --      Head Cir --      Peak Flow --      Pain Score 04/25/16 1227 6     Pain Loc --      Pain Edu? --      Excl. in [place]? --     Constitutional: Patient his mildly sluggish but alert and interactive. Rapidly breathing Eyes: Conjunctivae are normal. PERRL. EOMI. Head: Atraumatic. Nose: No congestion/rhinnorhea. Mouth/Throat: Mucous membranes are moist.  Oropharynx non-erythematous. Neck: No stridor.   Nontender with no meningismus Cardiovascular: Normal rate, regular rhythm. Grossly normal heart sounds.  Good peripheral circulation. Respiratory: Normal respiratory effort.  No retractions. Lungs CTAB. Increased breathing rate, tachypnea noted Abdominal: Soft and nontender. No distention. No guarding no rebound Back:  There is no focal tenderness or step off there is no midline tenderness there are no lesions noted. there is no CVA tenderness Musculoskeletal: No lower extremity tenderness. No joint effusions, no DVT signs strong distal pulses no edema Neurologic:  Normal speech and language. No gross focal neurologic deficits are appreciated.  Skin:  Skin is warm, dry and intact. No rash noted. Psychiatric: Mood and affect are normal. Speech and behavior are normal.  ____________________________________________   LABS (all labs ordered are listed, but only abnormal results are displayed)  Labs Reviewed  BASIC METABOLIC PANEL - Abnormal; Notable for the following:    CO2 8 (*)    Glucose, Bld 646 (*)    Creatinine, Ser 1.21 (*)    Calcium 8.8 (*)    GFR calc non Af Amer 55 (*)    Anion gap 26 (*)    All other components within normal limits  URINALYSIS COMPLETEWITH MICROSCOPIC ([facility] ONLY) - Abnormal; Notable for the following:    Color, Urine STRAW (*)     APPearance CLEAR (*)    Glucose, UA >500 (*)    Ketones, ur 2+ (*)    Squamous Epithelial / LPF 0-5 (*)    All other components within normal limits  GLUCOSE, CAPILLARY - Abnormal; Notable for the following:    Glucose-Capillary 573 (*)    All other components within normal limits  BLOOD GAS, ARTERIAL - Abnormal; Notable for the following:    pH, Arterial 7.13 (*)    pCO2 arterial  14 (*)    pO2, Arterial 124 (*)    Allens test (pass/fail) ARTERIAL DRAW (*)    All other components within normal limits  GLUCOSE, CAPILLARY - Abnormal; Notable for the following:    Glucose-Capillary 501 (*)    All other components within normal limits  HCG, QUANTITATIVE, PREGNANCY  CBC  BASIC METABOLIC PANEL  BASIC METABOLIC PANEL  BASIC METABOLIC PANEL  BASIC METABOLIC PANEL  CBC  CBG MONITORING, ED  POC URINE PREG, ED  POCT PREGNANCY, URINE   ____________________________________________  EKG  I personally interpreted any EKGs ordered by me or triage  ____________________________________________  RADIOLOGY  I reviewed any imaging ordered by me or triage that were performed during my shift and, if possible, patient and/or family made aware of any abnormal findings. ____________________________________________   PROCEDURES  Procedure(s) performed: None  Critical Care performed: CRITICAL CARE Performed by: Schuyler Amor   Total critical care time: 50 minutes  Critical care time was exclusive of separately billable procedures and treating other patients.  Critical care was necessary to treat or prevent imminent or life-threatening deterioration.  Critical care was time spent personally by me on the following activities: development of treatment plan with patient and/or surrogate as well as nursing, discussions with consultants, evaluation of patient's response to treatment, examination of patient, obtaining history from patient or surrogate, ordering and performing treatments and  interventions, ordering and review of laboratory studies, ordering and review of radiographic studies, pulse oximetry and re-evaluation of patient's condition.   ____________________________________________   INITIAL IMPRESSION / ASSESSMENT AND PLAN / ED COURSE  Pertinent labs & imaging results that were available during my care of the patient were reviewed by me and considered in my medical decision making (see chart for details).  Patient in DKA secondary to noncompliance with insulin. Giving her insulin during her IV fluids have admitted to the hospitalist. ____________________________________________   FINAL CLINICAL IMPRESSION(S) / ED DIAGNOSES  Final diagnoses:  None      This chart was dictated using voice recognition software.  Despite best efforts to proofread,  errors can occur which can change meaning.     Schuyler Amor, MD 04/25/16 [phone]

## 2016-04-25 NOTE — ED Notes (Signed)
Patient is alert and oriented, c/o sore throat. Family at bedside. NAD. Ice chips given.

## 2016-04-25 NOTE — ED Notes (Addendum)
Per EMS report, patient is an insulin-dependent diabetic who is compliant with her insulin who has a blood sugar of 570 today, but had a blood sugar of 500 last night at 2200, 219 at 0200 this morning and then began vomiting at 0330 this morning. Patient states she did not take insulin today. Patient received a liter bolus of NS in the ambulance. Patient had one episode of vomiting upon arrival.

## 2016-04-26 LAB — BASIC METABOLIC PANEL
Anion gap: 7 (ref 5–15)
Anion gap: 10 (ref 5–15)
BUN: 15 mg/dL (ref 6–20)
BUN: 14 mg/dL (ref 6–20)
CALCIUM: 8.2 mg/dL — AB (ref 8.9–10.3)
CALCIUM: 8.3 mg/dL — AB (ref 8.9–10.3)
CO2: 17 mmol/L — ABNORMAL LOW (ref 22–32)
CO2: 15 mmol/L — ABNORMAL LOW (ref 22–32)
CREATININE: 0.84 mg/dL (ref 0.44–1.00)
Chloride: 118 mmol/L — ABNORMAL HIGH (ref 101–111)
Chloride: 116 mmol/L — ABNORMAL HIGH (ref 101–111)
Creatinine, Ser: 0.9 mg/dL (ref 0.44–1.00)
GFR calc Af Amer: >60 mL/min (ref >60)
GFR calc Af Amer: >60 mL/min (ref >60)
GLUCOSE: 192 mg/dL — AB (ref 65–99)
GLUCOSE: 182 mg/dL — AB (ref 65–99)
POTASSIUM: 3.7 mmol/L (ref 3.5–5.1)
Potassium: 3.7 mmol/L (ref 3.5–5.1)
SODIUM: 141 mmol/L (ref 135–145)
Sodium: 142 mmol/L (ref 135–145)

## 2016-04-26 LAB — GLUCOSE, CAPILLARY
GLUCOSE-CAPILLARY: 125 mg/dL — AB (ref 65–99)
GLUCOSE-CAPILLARY: 168 mg/dL — AB (ref 65–99)
GLUCOSE-CAPILLARY: 180 mg/dL — AB (ref 65–99)
GLUCOSE-CAPILLARY: 187 mg/dL — AB (ref 65–99)
GLUCOSE-CAPILLARY: 191 mg/dL — AB (ref 65–99)
GLUCOSE-CAPILLARY: 192 mg/dL — AB (ref 65–99)
Glucose-Capillary: 193 mg/dL — ABNORMAL HIGH (ref 65–99)
Glucose-Capillary: 137 mg/dL — ABNORMAL HIGH (ref 65–99)
Glucose-Capillary: 155 mg/dL — ABNORMAL HIGH (ref 65–99)
Glucose-Capillary: 159 mg/dL — ABNORMAL HIGH (ref 65–99)
Glucose-Capillary: 163 mg/dL — ABNORMAL HIGH (ref 65–99)

## 2016-04-26 LAB — CBC
HCT: 38.9 % (ref 35.0–47.0)
Hemoglobin: 13.2 g/dL (ref 12.0–16.0)
MCH: 31.6 pg (ref 26.0–34.0)
MCHC: 33.9 g/dL (ref 32.0–36.0)
MCV: 93.3 fL (ref 80.0–100.0)
PLATELETS: 334 10*3/uL (ref 150–440)
RBC: 4.17 MIL/uL (ref 3.80–5.20)
RDW: 12.5 % (ref 11.5–14.5)
WBC: 17.5 10*3/uL — AB (ref 3.6–11.0)

## 2016-04-26 MED ORDER — ONDANSETRON HCL 4 MG/2ML IJ SOLN
4.0000 mg | Freq: Four times a day (QID) | INTRAMUSCULAR | Status: DC | PRN
  Administered 2016-04-26: 4 mg via INTRAVENOUS
  Filled 2016-04-26: qty 2

## 2016-04-26 MED ORDER — INSULIN GLARGINE 100 UNIT/ML ~~LOC~~ SOLN
22.0000 [IU] | Freq: Two times a day (BID) | SUBCUTANEOUS | Status: DC
  Administered 2016-04-26: 22 [IU] via SUBCUTANEOUS
  Filled 2016-04-26 (×3): qty 0.22

## 2016-04-26 MED ORDER — ALPRAZOLAM 0.25 MG PO TABS: 0.2500 mg | ORAL_TABLET | Freq: Every day | ORAL | Status: DC | PRN

## 2016-04-26 MED ORDER — INSULIN GLARGINE 100 UNIT/ML ~~LOC~~ SOLN: 22.0000 [IU] | Freq: Two times a day (BID) | SUBCUTANEOUS | Status: DC

## 2016-04-26 MED ORDER — INSULIN ASPART 100 UNIT/ML ~~LOC~~ SOLN
0.0000 [IU] | Freq: Three times a day (TID) | SUBCUTANEOUS | Status: DC
  Administered 2016-04-26: 2 [IU] via SUBCUTANEOUS
  Filled 2016-04-26: qty 2

## 2016-04-26 NOTE — Discharge Summary (Signed)
[place] [facility] [place] NAME: Sheryl White    MR#:  [id number]  DATE OF BIRTH:  June 28, 1976  DATE OF ADMISSION:  04/25/2016 ADMITTING PHYSICIAN: Vaughan Basta, MD  DATE OF DISCHARGE: 04/26/16  PRIMARY CARE PHYSICIAN: Geoffery Lyons, MD    ADMISSION DIAGNOSIS:  High Sugar  DISCHARGE DIAGNOSIS:  DKA in type 1 DM  SECONDARY DIAGNOSIS:   Past Medical History  Diagnosis Date  . Endometriosis   . Ovarian cyst   . Diabetes mellitus   . Angina   . GERD (gastroesophageal reflux disease)   . Anxiety   . Depression   . Dyslipidemia     HOSPITAL COURSE:  Sheryl White is a 40 y.o. female with a known history of DM -1, Missed some Insulin doses, as she forgot. Started vomiting last night, and brought by her mother- FOund to be inDKA with high anion gap. Given 2 ltr bolus IV and started on Insulin Drip.  * DKA  Received IV insulin drip, Anion gap closed. Patient feels a lot better with very hungry  Hemodynamically stable. Labs appear improved  *Leukocytosis appears reactive in the setting of nausea vomiting and DKA. No fever. Patient does not have symptoms of cough or dysuria.  * Anxiety  Resume Xanax  Overall improved. We'll discharge after patient consumes a regular diet carb controlled. Discussed discharge plan with mother. Patient recommended follow-up with endocrinologist as outpatient to avoid DKA and get good control of her diabetes. Name and information for endocrinologist given. CONSULTS OBTAINED:     DRUG ALLERGIES:  No Known Allergies  DISCHARGE MEDICATIONS:   Current Discharge Medication List    CONTINUE these medications which have CHANGED   Details  insulin glargine (LANTUS) 100 UNIT/ML injection Inject 0.22 mLs (22 Units total) into the skin 2 (two) times daily. Qty: 10 mL, Refills: 12      CONTINUE these medications which have NOT CHANGED   Details  ALPRAZolam (XANAX) 0.25 MG tablet Take  0.25 mg by mouth daily as needed (panic attacks).     insulin aspart (NOVOLOG) 100 UNIT/ML injection Inject 3-12 Units into the skin See admin instructions. Inject 3-12 units per sliding scale subcutaneously 3-4 times daily with meals        If you experience worsening of your admission symptoms, develop shortness of breath, life threatening emergency, suicidal or homicidal thoughts you must seek medical attention immediately by calling 911 or calling your MD immediately  if symptoms less severe.  You Must read complete instructions/literature along with all the possible adverse reactions/side effects for all the Medicines you take and that have been prescribed to you. Take any new Medicines after you have completely understood and accept all the possible adverse reactions/side effects.   Please note  You were cared for by a hospitalist during your hospital stay. If you have any questions about your discharge medications or the care you received while you were in the hospital after you are discharged, you can call the unit and asked to speak with the hospitalist on call if the hospitalist that took care of you is not available. Once you are discharged, your primary care physician will handle any further medical issues. Please note that NO REFILLS for any discharge medications will be authorized once you are discharged, as it is imperative that you return to your primary care physician (or establish a relationship with a primary care physician if you do not have one) for your aftercare needs  so that they can reassess your need for medications and monitor your lab values. Today   SUBJECTIVE  I'm very hungry.   VITAL SIGNS:  Blood pressure 117/74, pulse 96, temperature 97.8 F (36.6 C), temperature source Oral, resp. rate 20, height 5\' 6"  (1.676 m), weight 65.3 kg (143 lb 15.4 oz), last menstrual period 02/10/2012, SpO2 100 %.  I/O:   Intake/Output Summary (Last 24 hours) at 04/26/16  1127 Last data filed at 04/26/16 0700  Gross per 24 hour  Intake 1653.8 ml  Output   1450 ml  Net  203.8 ml    PHYSICAL EXAMINATION:  GENERAL:  40 y.o.-year-old patient lying in the bed with no acute distress.  EYES: Pupils equal, round, reactive to light and accommodation. No scleral icterus. Extraocular muscles intact.  HEENT: Head atraumatic, normocephalic. Oropharynx and nasopharynx clear.  NECK:  Supple, no jugular venous distention. No thyroid enlargement, no tenderness.  LUNGS: Normal breath sounds bilaterally, no wheezing, rales,rhonchi or crepitation. No use of accessory muscles of respiration.  CARDIOVASCULAR: S1, S2 normal. No murmurs, rubs, or gallops.  ABDOMEN: Soft, non-tender, non-distended. Bowel sounds present. No organomegaly or mass.  EXTREMITIES: No pedal edema, cyanosis, or clubbing.  NEUROLOGIC: Cranial nerves II through XII are intact. Muscle strength 5/5 in all extremities. Sensation intact. Gait not checked.  PSYCHIATRIC: The patient is alert and oriented x 3.  SKIN: No obvious rash, lesion, or ulcer.   DATA REVIEW:   CBC   Recent Labs Lab 04/26/16 0623  WBC 17.5*  HGB 13.2  HCT 38.9  PLT 334    Chemistries   Recent Labs Lab 04/26/16 0623  NA 141  K 3.7  CL 116*  CO2 15*  GLUCOSE 182*  BUN 14  CREATININE 0.84  CALCIUM 8.3*    Microbiology Results   Recent Results (from the past 240 hour(s))  MRSA PCR Screening     Status: None   Collection Time: 04/25/16  4:13 PM  Result Value Ref Range Status   MRSA by PCR NEGATIVE NEGATIVE Final    Comment:        The GeneXpert MRSA Assay (FDA approved for NASAL specimens only), is one component of a comprehensive MRSA colonization surveillance program. It is not intended to diagnose MRSA infection nor to guide or monitor treatment for MRSA infections.     RADIOLOGY:  No results found.   Management plans discussed with the patient, family and they are in agreement.  CODE STATUS:      Code Status Orders        Start     Ordered   04/25/16 1648  Full code   Continuous     04/25/16 1647    Code Status History    Date Active Date Inactive Code Status Order ID Comments User Context   04/25/2016  2:16 PM 04/25/2016  4:47 PM Full Code [id number]  Vaughan Basta, MD ED   01/31/2016  2:34 AM 02/01/2016  2:32 PM Full Code [id number]  Rise Patience, MD ED   03/31/2015  1:21 AM 04/02/2015  3:05 PM Full Code [id number]  Jani Gravel, MD Inpatient   10/29/2013 10:36 AM 10/30/2013  2:41 PM Full Code [id number]  Geoffery Lyons, MD Inpatient   10/07/2013  3:48 AM 10/07/2013 10:32 PM Full Code [id number]  Leanna Battles, MD Inpatient   02/19/2012 12:24 PM 02/20/2012  1:03 AM Full Code [id number]  Gerald Leitz, RN Inpatient      TOTAL TIME TAKING CARE OF  THIS PATIENT: 40 minutes.    Cyndia Degraff M.D on 04/26/2016 at 11:27 AM  Between 7am to 6pm - Pager - [phone] After 6pm go to [email or web address] - password [email or web address] [facility]  Office  [phone]  CC: Primary care physician; Geoffery Lyons, MD

## 2016-04-26 NOTE — Progress Notes (Signed)
Dr Estanislado Pandy notified of q4h BMET results.  MD states to cont DKA protocol on insulin gtt.

## 2016-04-26 NOTE — Plan of Care (Signed)
Problem: Health Behavior/Discharge Planning: Goal: Ability to manage health-related needs will improve Outcome: Not Progressing Pt continues to drink soda brought in to her by visitors, even though it has been explained to her by 4 nurses her NPO status and reason for it.  Pt does not believe the soda negatively affects her health status at this time.  Voices displeasure that RN removed pt's soda from the room.  Problem: Pain Managment: Goal: General experience of comfort will improve Outcome: Adequate for Discharge C/O back pain.  Medicated with Tylenol and repositioned to side.  Reports feeling better after these interventions.

## 2016-04-26 NOTE — Progress Notes (Signed)
Patient discharged by wheelchair with mother and nurse aide- Marcene Brawn.  Patient alert and oriented. Vitals stable.  IV's and telemetry removed.  Discharge instructions given.

## 2016-05-02 LAB — BLOOD GAS, ARTERIAL
FIO2: 0.21
PATIENT TEMPERATURE: 37
PCO2 ART: 14 mmHg — AB (ref 32.0–48.0)
PH ART: 7.13 — AB (ref 7.350–7.450)
PO2 ART: 124 mmHg — AB (ref 83.0–108.0)

## 2016-10-09 ENCOUNTER — Other Ambulatory Visit: Payer: Self-pay | Admitting: Obstetrics & Gynecology

## 2016-10-09 DIAGNOSIS — R928 Other abnormal and inconclusive findings on diagnostic imaging of breast: Secondary | ICD-10-CM

## 2017-02-13 ENCOUNTER — Emergency Department
Admission: EM | Admit: 2017-02-13 | Discharge: 2017-02-13 | Disposition: A | Discharge status: Home / Self Care | Payer: BLUE CROSS/BLUE SHIELD | Attending: Emergency Medicine | Admitting: Emergency Medicine

## 2017-02-13 ENCOUNTER — Encounter: Payer: Self-pay | Admitting: Emergency Medicine

## 2017-02-13 DIAGNOSIS — F17210 Nicotine dependence, cigarettes, uncomplicated: Secondary | ICD-10-CM | POA: Insufficient documentation

## 2017-02-13 DIAGNOSIS — E1065 Type 1 diabetes mellitus with hyperglycemia: Secondary | ICD-10-CM | POA: Diagnosis not present

## 2017-02-13 DIAGNOSIS — N390 Urinary tract infection, site not specified: Secondary | ICD-10-CM | POA: Insufficient documentation

## 2017-02-13 DIAGNOSIS — R0602 Shortness of breath: Secondary | ICD-10-CM | POA: Diagnosis present

## 2017-02-13 DIAGNOSIS — R112 Nausea with vomiting, unspecified: Secondary | ICD-10-CM

## 2017-02-13 DIAGNOSIS — R739 Hyperglycemia, unspecified: Secondary | ICD-10-CM

## 2017-02-13 LAB — URINALYSIS, COMPLETE (UACMP) WITH MICROSCOPIC
Bilirubin Urine: NEGATIVE
Hgb urine dipstick: NEGATIVE
KETONES UR: 80 mg/dL — AB
Nitrite: POSITIVE — AB
PH: 5 (ref 5.0–8.0)
Protein, ur: 100 mg/dL — AB
SPECIFIC GRAVITY, URINE: 1.022 (ref 1.005–1.030)

## 2017-02-13 LAB — CBC
HCT: 44.9 % (ref 35.0–47.0)
Hemoglobin: 15.4 g/dL (ref 12.0–16.0)
MCH: 30.7 pg (ref 26.0–34.0)
MCHC: 34.2 g/dL (ref 32.0–36.0)
MCV: 89.6 fL (ref 80.0–100.0)
PLATELETS: 408 10*3/uL (ref 150–440)
RBC: 5.01 MIL/uL (ref 3.80–5.20)
RDW: 12.6 % (ref 11.5–14.5)
WBC: 8.4 10*3/uL (ref 3.6–11.0)

## 2017-02-13 LAB — BASIC METABOLIC PANEL
Anion gap: 13 (ref 5–15)
BUN: 14 mg/dL (ref 6–20)
CALCIUM: 10 mg/dL (ref 8.9–10.3)
CHLORIDE: 103 mmol/L (ref 101–111)
CO2: 22 mmol/L (ref 22–32)
CREATININE: 0.76 mg/dL (ref 0.44–1.00)
GFR calc non Af Amer: >60 mL/min (ref >60)
GLUCOSE: 252 mg/dL — AB (ref 65–99)
Potassium: 3.9 mmol/L (ref 3.5–5.1)
Sodium: 138 mmol/L (ref 135–145)

## 2017-02-13 LAB — GLUCOSE, CAPILLARY
GLUCOSE-CAPILLARY: 252 mg/dL — AB (ref 65–99)
Glucose-Capillary: 247 mg/dL — ABNORMAL HIGH (ref 65–99)

## 2017-02-13 MED ORDER — SODIUM CHLORIDE 0.9 % IV BOLUS (SEPSIS)
1000.0000 mL | INTRAVENOUS | Status: AC
  Administered 2017-02-13: 1000 mL via INTRAVENOUS

## 2017-02-13 MED ORDER — ONDANSETRON 4 MG PO TBDP: ORAL_TABLET | ORAL | 0 refills | Status: DC

## 2017-02-13 MED ORDER — DEXTROSE 5 % IV SOLN: 1.0000 g | INTRAVENOUS | Status: DC

## 2017-02-13 MED ORDER — CEFTRIAXONE SODIUM-DEXTROSE 1-3.74 GM-% IV SOLR
1.0000 g | Freq: Once | INTRAVENOUS | Status: AC
  Administered 2017-02-13: 1 g via INTRAVENOUS
  Filled 2017-02-13: qty 50

## 2017-02-13 MED ORDER — CEPHALEXIN 500 MG PO CAPS: 500.0000 mg | ORAL_CAPSULE | Freq: Two times a day (BID) | ORAL | 0 refills | Status: DC

## 2017-02-13 NOTE — Discharge Instructions (Signed)
Although you did have some ketones in your urine, your anion gap was normal and you have no evidence of DKA.  We provided 2 L of IV fluids and started treatment for your mild urinary tract infection.  Please complete the full course of antibiotics as prescribed and follow up with your doctor at the next available opportunity.    Return to the emergency department if you develop new or worsening symptoms that concern you.

## 2017-02-13 NOTE — ED Provider Notes (Signed)
[facility] Emergency Department Provider Note  ____________________________________________   First MD Initiated Contact with Patient 02/13/17 [phone]     (approximate)  I have reviewed the triage vital signs and the nursing notes.   HISTORY  Chief Complaint Hyperglycemia    HPI Sheryl White is a 41 y.o. female with a history of type 1 diabetes and multiple prior episodes of DKA who presents by private vehicle with complaints of several days of hyperglycemia and general malaise.  No fever, some vomiting, minimal SOB at times.  Denies abdominal pain, dysuria, vaginal symptoms.  She reports that her urine at home was positive for ketones.  She states her blood sugar has been as high as 500 but was about 250 when she came in to the ED.  She describes overall her constellation of symptoms as severe and nothing in particular is making it better or worse.   Past Medical History:  Diagnosis Date  . Angina   . Anxiety   . Depression   . Diabetes mellitus   . Dyslipidemia   . Endometriosis   . GERD (gastroesophageal reflux disease)   . Ovarian cyst     Patient Active Problem List   Diagnosis Date Noted  . ARF (acute renal failure) ([place]) 01/31/2016  . Nausea & vomiting 01/31/2016  . DKA, type 1 ([place]) 01/31/2016  . Emesis   . DKA (diabetic ketoacidoses) ([place]) 03/31/2015  . UTI (lower urinary tract infection) 03/30/2015  . Diabetic ketoacidosis, type I ([place]) 10/07/2013    Class: Acute  . Abdominal pain, right lower quadrant 01/09/2012  . CHEST PAIN UNSPECIFIED 11/18/2009  . ABDOMINAL PAIN, EPIGASTRIC 09/12/2009  . DM (diabetes mellitus), type 1 ([place]) 06/01/2008  . DM 05/31/2008  . DYSLIPIDEMIA 05/31/2008  . DEPRESSION 05/31/2008  . GERD 05/31/2008  . RECTAL BLEEDING 05/31/2008  . BLOOD IN STOOL 05/31/2008  . DIARRHEA 05/31/2008    Past Surgical History:  Procedure Laterality Date  . ABDOMINAL HYSTERECTOMY     still have left ovary  . CARPAL  TUNNEL RELEASE     bilateral  . CESAREAN SECTION     x 1  . COLONOSCOPY    . ENDOMETRIAL ABLATION    . LAPAROSCOPIC SALPINGOOPHERECTOMY    . svd     x 1  . TRIGGER FINGER RELEASE     bilateral  . TUBAL LIGATION    . UPPER GASTROINTESTINAL ENDOSCOPY    . WISDOM TOOTH EXTRACTION      Prior to Admission medications   Medication Sig Start Date End Date Taking? Authorizing Provider  ALPRAZolam Duanne Moron) 0.25 MG tablet Take 0.25 mg by mouth daily as needed (panic attacks).     Historical Provider, MD  cephALEXin (KEFLEX) 500 MG capsule Take 1 capsule (500 mg total) by mouth 2 (two) times daily. 02/13/17   Hinda Kehr, MD  insulin aspart (NOVOLOG) 100 UNIT/ML injection Inject 3-12 Units into the skin See admin instructions. Inject 3-12 units per sliding scale subcutaneously 3-4 times daily with meals    Historical Provider, MD  insulin glargine (LANTUS) 100 UNIT/ML injection Inject 0.22 mLs (22 Units total) into the skin 2 (two) times daily. 04/26/16   Fritzi Mandes, MD  ondansetron (ZOFRAN ODT) 4 MG disintegrating tablet Allow 1-2 tablets to dissolve in your mouth every 8 hours as needed for nausea/vomiting 02/13/17   Hinda Kehr, MD    Allergies Patient has no known allergies.  Family History  Problem Relation Age of Onset  . Colon  polyps Father   . Cancer Father     ?  . Diabetes Son   . Diabetes Sister     Social History Social History  Substance Use Topics  . Smoking status: Current Every Day Smoker    Packs/day: 1.00    Years: 5.00    Types: Cigarettes  . Smokeless tobacco: Never Used  . Alcohol use 0.0 oz/week    2 - 3 Standard drinks or equivalent per week     Comment: occasionally    Review of Systems Constitutional: No fever/chills.  Gen. malaise. Eyes: No visual changes. ENT: No sore throat. Cardiovascular: Denies chest pain. Respiratory: Occasional shortness of breath.  No cough. Gastrointestinal: No abdominal pain.  Intermittent nausea and vomiting.  No diarrhea.   No constipation. Genitourinary: Negative for dysuria. Musculoskeletal: Negative for back pain. Skin: Negative for rash. Neurological: Negative for headaches, focal weakness or numbness.  10-point ROS otherwise negative.  ____________________________________________   PHYSICAL EXAM:  VITAL SIGNS: ED Triage Vitals  Enc Vitals Group     BP 02/13/17 0247 135/87     Pulse Rate 02/13/17 0247 (!) 118     Resp 02/13/17 0247 20     Temp 02/13/17 0247 97.8 F (36.6 C)     Temp Source 02/13/17 0247 Oral     SpO2 02/13/17 0247 100 %     Weight 02/13/17 0247 180 lb (81.6 kg)     Height 02/13/17 0247 5\' 6"  (1.676 m)     Head Circumference --      Peak Flow --      Pain Score 02/13/17 0248 2     Pain Loc --      Pain Edu? --      Excl. in [place]? --     Constitutional: Alert and oriented. Well appearing and in no acute distress. Eyes: Conjunctivae are normal. PERRL. EOMI. Head: Atraumatic. Nose: No congestion/rhinnorhea. Mouth/Throat: Mucous membranes are moist. Neck: No stridor.  No meningeal signs.   Cardiovascular: Normal rate, regular rhythm. Good peripheral circulation. Grossly normal heart sounds. Respiratory: Normal respiratory effort.  No retractions. Lungs CTAB. Gastrointestinal: Soft and nontender. No distention.  Musculoskeletal: No lower extremity tenderness nor edema. No gross deformities of extremities. Neurologic:  Normal speech and language. No gross focal neurologic deficits are appreciated.  Skin:  Skin is warm, dry and intact. No rash noted. Psychiatric: Mood and affect are normal. Speech and behavior are normal.  ____________________________________________   LABS (all labs ordered are listed, but only abnormal results are displayed)  Labs Reviewed  BASIC METABOLIC PANEL - Abnormal; Notable for the following:       Result Value   Glucose, Bld 252 (*)    All other components within normal limits  URINALYSIS, COMPLETE (UACMP) WITH MICROSCOPIC - Abnormal;  Notable for the following:    Color, Urine YELLOW (*)    APPearance HAZY (*)    Glucose, UA >=500 (*)    Ketones, ur 80 (*)    Protein, ur 100 (*)    Nitrite POSITIVE (*)    Leukocytes, UA TRACE (*)    Bacteria, UA FEW (*)    Squamous Epithelial / LPF 0-5 (*)    All other components within normal limits  GLUCOSE, CAPILLARY - Abnormal; Notable for the following:    Glucose-Capillary 247 (*)    All other components within normal limits  GLUCOSE, CAPILLARY - Abnormal; Notable for the following:    Glucose-Capillary 252 (*)    All other components  within normal limits  URINE CULTURE  CBC  CBG MONITORING, ED   ____________________________________________  EKG  None - EKG not ordered by ED physician ____________________________________________  RADIOLOGY   No results found.  ____________________________________________   PROCEDURES  Procedure(s) performed:   Procedures   Critical Care performed: No ____________________________________________   INITIAL IMPRESSION / ASSESSMENT AND PLAN / ED COURSE  Pertinent labs & imaging results that were available during my care of the patient were reviewed by me and considered in my medical decision making (see chart for details).  The patient does have some ketones in her urine and her urinalysis is also notable for being positive for nitrites.  I suspect this is the source of the infection that is causing her difficulties maintaining a normal blood glucose.  Fortunately she has a normal anion gap and no leukocytosis.  I will give her a total of 2 L of IV fluids in the ED and a gram of ceftriaxone to begin UTI treatment.  A urine culture is sent.  She is comfortable with the plan for outpatient Keflex, Zofran, and follow-up.  I gave my usual and customary return precautions.         ____________________________________________  FINAL CLINICAL IMPRESSION(S) / ED DIAGNOSES  Final diagnoses:  Hyperglycemia  Urinary tract  infection without hematuria, site unspecified  Non-intractable vomiting with nausea, unspecified vomiting type     MEDICATIONS GIVEN DURING THIS VISIT:  Medications  sodium chloride 0.9 % bolus 1,000 mL (1,000 mLs Intravenous New Bag/Given 02/13/17 0418)  cefTRIAXone (ROCEPHIN) IVPB 1 g (1 g Intravenous Given 02/13/17 0419)     NEW OUTPATIENT MEDICATIONS STARTED DURING THIS VISIT:  New Prescriptions   CEPHALEXIN (KEFLEX) 500 MG CAPSULE    Take 1 capsule (500 mg total) by mouth 2 (two) times daily.   ONDANSETRON (ZOFRAN ODT) 4 MG DISINTEGRATING TABLET    Allow 1-2 tablets to dissolve in your mouth every 8 hours as needed for nausea/vomiting    Modified Medications   No medications on file    Discontinued Medications   No medications on file     Note:  This document was prepared using Dragon voice recognition software and may include unintentional dictation errors.    Hinda Kehr, MD 02/13/17 [phone]

## 2017-02-13 NOTE — ED Triage Notes (Signed)
PT to triage via [place], report DM 1, report BG 257 at home but has as high as 500, report 2 units insulin admin 1 hr ago, report nausea and SOB.  Pt report urine at home positive for ketones.

## 2017-02-15 LAB — URINE CULTURE
Culture: 30000 — AB
Special Requests: NORMAL

## 2017-03-03 ENCOUNTER — Encounter: Payer: Self-pay | Admitting: *Deleted

## 2017-03-03 ENCOUNTER — Emergency Department: Payer: BLUE CROSS/BLUE SHIELD

## 2017-03-03 ENCOUNTER — Emergency Department
Admission: EM | Admit: 2017-03-03 | Discharge: 2017-03-03 | Disposition: A | Discharge status: Home / Self Care | Payer: BLUE CROSS/BLUE SHIELD | Attending: Emergency Medicine | Admitting: Emergency Medicine

## 2017-03-03 DIAGNOSIS — Z79899 Other long term (current) drug therapy: Secondary | ICD-10-CM | POA: Diagnosis not present

## 2017-03-03 DIAGNOSIS — E1065 Type 1 diabetes mellitus with hyperglycemia: Secondary | ICD-10-CM | POA: Insufficient documentation

## 2017-03-03 DIAGNOSIS — F17210 Nicotine dependence, cigarettes, uncomplicated: Secondary | ICD-10-CM | POA: Diagnosis not present

## 2017-03-03 DIAGNOSIS — R112 Nausea with vomiting, unspecified: Secondary | ICD-10-CM | POA: Diagnosis present

## 2017-03-03 LAB — CBC
HCT: 49.6 % — ABNORMAL HIGH (ref 35.0–47.0)
HEMOGLOBIN: 16.8 g/dL — AB (ref 12.0–16.0)
MCH: 30.7 pg (ref 26.0–34.0)
MCHC: 34 g/dL (ref 32.0–36.0)
MCV: 90.3 fL (ref 80.0–100.0)
Platelets: 369 10*3/uL (ref 150–440)
RBC: 5.49 MIL/uL — ABNORMAL HIGH (ref 3.80–5.20)
RDW: 13.1 % (ref 11.5–14.5)
WBC: 13.8 10*3/uL — AB (ref 3.6–11.0)

## 2017-03-03 LAB — URINALYSIS, COMPLETE (UACMP) WITH MICROSCOPIC
BILIRUBIN URINE: NEGATIVE
Bacteria, UA: NONE SEEN
Glucose, UA: >=500 mg/dL — AB
HGB URINE DIPSTICK: NEGATIVE
Ketones, ur: 80 mg/dL — AB
Leukocytes, UA: NEGATIVE
NITRITE: NEGATIVE
PH: 5 (ref 5.0–8.0)
Protein, ur: NEGATIVE mg/dL
SPECIFIC GRAVITY, URINE: 1.026 (ref 1.005–1.030)
WBC UA: NONE SEEN WBC/hpf (ref 0–5)

## 2017-03-03 LAB — BASIC METABOLIC PANEL
Anion gap: 10 (ref 5–15)
BUN: 11 mg/dL (ref 6–20)
CO2: 23 mmol/L (ref 22–32)
Calcium: 8.8 mg/dL — ABNORMAL LOW (ref 8.9–10.3)
Chloride: 106 mmol/L (ref 101–111)
Creatinine, Ser: 0.8 mg/dL (ref 0.44–1.00)
GFR calc Af Amer: >60 mL/min (ref >60)
GFR calc non Af Amer: >60 mL/min (ref >60)
GLUCOSE: 220 mg/dL — AB (ref 65–99)
POTASSIUM: 3.9 mmol/L (ref 3.5–5.1)
Sodium: 139 mmol/L (ref 135–145)

## 2017-03-03 LAB — COMPREHENSIVE METABOLIC PANEL
ALK PHOS: 68 U/L (ref 38–126)
ALT: 15 U/L (ref 14–54)
AST: 20 U/L (ref 15–41)
Albumin: 4.7 g/dL (ref 3.5–5.0)
Anion gap: 15 (ref 5–15)
BILIRUBIN TOTAL: 1.4 mg/dL — AB (ref 0.3–1.2)
BUN: 11 mg/dL (ref 6–20)
CO2: 19 mmol/L — ABNORMAL LOW (ref 22–32)
CREATININE: 0.72 mg/dL (ref 0.44–1.00)
Calcium: 9.4 mg/dL (ref 8.9–10.3)
Chloride: 102 mmol/L (ref 101–111)
GFR calc Af Amer: >60 mL/min (ref >60)
GFR calc non Af Amer: >60 mL/min (ref >60)
Glucose, Bld: 395 mg/dL — ABNORMAL HIGH (ref 65–99)
Potassium: 4.4 mmol/L (ref 3.5–5.1)
Sodium: 136 mmol/L (ref 135–145)
TOTAL PROTEIN: 7.9 g/dL (ref 6.5–8.1)

## 2017-03-03 LAB — PREGNANCY, URINE: Preg Test, Ur: NEGATIVE

## 2017-03-03 LAB — LIPASE, BLOOD: Lipase: 11 U/L (ref 11–51)

## 2017-03-03 LAB — GLUCOSE, CAPILLARY
Glucose-Capillary: 372 mg/dL — ABNORMAL HIGH (ref 65–99)
Glucose-Capillary: 205 mg/dL — ABNORMAL HIGH (ref 65–99)

## 2017-03-03 MED ORDER — ONDANSETRON HCL 4 MG/2ML IJ SOLN
INTRAMUSCULAR | Status: DC
Start: 2017-03-03 — End: 2017-03-04
  Filled 2017-03-03: qty 2

## 2017-03-03 MED ORDER — ONDANSETRON HCL 4 MG/2ML IJ SOLN
4.0000 mg | Freq: Once | INTRAMUSCULAR | Status: AC
  Administered 2017-03-03: 4 mg via INTRAVENOUS

## 2017-03-03 MED ORDER — INSULIN ASPART 100 UNIT/ML ~~LOC~~ SOLN
SUBCUTANEOUS | Status: AC
  Filled 2017-03-03: qty 7

## 2017-03-03 MED ORDER — ONDANSETRON HCL 4 MG PO TABS: 4.0000 mg | ORAL_TABLET | Freq: Three times a day (TID) | ORAL | 0 refills | Status: DC | PRN

## 2017-03-03 MED ORDER — KETOROLAC TROMETHAMINE 30 MG/ML IJ SOLN
10.0000 mg | Freq: Once | INTRAMUSCULAR | Status: AC
  Administered 2017-03-03: 9.9 mg via INTRAVENOUS

## 2017-03-03 MED ORDER — KETOROLAC TROMETHAMINE 30 MG/ML IJ SOLN
INTRAMUSCULAR | Status: AC
  Filled 2017-03-03: qty 1

## 2017-03-03 MED ORDER — INSULIN ASPART 100 UNIT/ML ~~LOC~~ SOLN
7.0000 [IU] | Freq: Once | SUBCUTANEOUS | Status: AC
  Administered 2017-03-03: 7 [IU] via INTRAVENOUS
  Filled 2017-03-03: qty 0.07

## 2017-03-03 MED ORDER — SODIUM CHLORIDE 0.9 % IV BOLUS (SEPSIS)
1000.0000 mL | Freq: Once | INTRAVENOUS | Status: AC
  Administered 2017-03-03: 1000 mL via INTRAVENOUS

## 2017-03-03 NOTE — ED Notes (Signed)
Pt discharged to home.  Family member driving.  Discharge instructions reviewed.  Verbalized understanding.  No questions or concerns at this time.  Teach back verified.  Pt in NAD.  No items left in ED.   

## 2017-03-03 NOTE — ED Provider Notes (Signed)
[facility] Emergency Department Provider Note  ____________________________________________  Time seen: Approximately 8:40 PM  I have reviewed the triage vital signs and the nursing notes.   HISTORY  Chief Complaint Hyperglycemia and Emesis   HPI Sheryl White is a 41 y.o. female with a history of type 1 diabetes and multiple prior episodes of DKA who presents for evaluation of nausea, vomiting, and elevated sugars. Patient reports that since yesterday evening she has had several episodes of nonbloody nonbilious emesis, significant nausea and elevated blood glucose in the 300s. She has had chills but no fever. She also noticed that earlier today she had a bilateral lower abdominal pain that she describes as mild, dull, feels like a muscle pull, constant and nonradiating. She has no abdominal pain, no diarrhea, no cough or congestion, no dysuria or hematuria, vaginal discharge. She endorses shortness of breath which she says she has had for many years and according to her of unknown etiology. The shortness of breath is not worse today. NO CP, cough, congestion. s/p hysterectomy  Past Medical History:  Diagnosis Date  . Angina   . Anxiety   . Depression   . Diabetes mellitus   . Dyslipidemia   . Endometriosis   . GERD (gastroesophageal reflux disease)   . Ovarian cyst     Patient Active Problem List   Diagnosis Date Noted  . ARF (acute renal failure) ([place]) 01/31/2016  . Nausea & vomiting 01/31/2016  . DKA, type 1 ([place]) 01/31/2016  . Emesis   . DKA (diabetic ketoacidoses) ([place]) 03/31/2015  . UTI (lower urinary tract infection) 03/30/2015  . Diabetic ketoacidosis, type I ([place]) 10/07/2013    Class: Acute  . Abdominal pain, right lower quadrant 01/09/2012  . CHEST PAIN UNSPECIFIED 11/18/2009  . ABDOMINAL PAIN, EPIGASTRIC 09/12/2009  . DM (diabetes mellitus), type 1 ([place]) 06/01/2008  . DM 05/31/2008  . DYSLIPIDEMIA 05/31/2008  . DEPRESSION  05/31/2008  . GERD 05/31/2008  . RECTAL BLEEDING 05/31/2008  . BLOOD IN STOOL 05/31/2008  . DIARRHEA 05/31/2008    Past Surgical History:  Procedure Laterality Date  . ABDOMINAL HYSTERECTOMY     still have left ovary  . CARPAL TUNNEL RELEASE     bilateral  . CESAREAN SECTION     x 1  . COLONOSCOPY    . ENDOMETRIAL ABLATION    . LAPAROSCOPIC SALPINGOOPHERECTOMY    . svd     x 1  . TRIGGER FINGER RELEASE     bilateral  . TUBAL LIGATION    . UPPER GASTROINTESTINAL ENDOSCOPY    . WISDOM TOOTH EXTRACTION      Prior to Admission medications   Medication Sig Start Date End Date Taking? Authorizing Provider  ALPRAZolam Duanne Moron) 0.25 MG tablet Take 0.25 mg by mouth daily as needed (panic attacks).     Historical Provider, MD  cephALEXin (KEFLEX) 500 MG capsule Take 1 capsule (500 mg total) by mouth 2 (two) times daily. 02/13/17   Hinda Kehr, MD  insulin aspart (NOVOLOG) 100 UNIT/ML injection Inject 3-12 Units into the skin See admin instructions. Inject 3-12 units per sliding scale subcutaneously 3-4 times daily with meals    Historical Provider, MD  insulin glargine (LANTUS) 100 UNIT/ML injection Inject 0.22 mLs (22 Units total) into the skin 2 (two) times daily. 04/26/16   Fritzi Mandes, MD  ondansetron (ZOFRAN ODT) 4 MG disintegrating tablet Allow 1-2 tablets to dissolve in your mouth every 8 hours as needed for nausea/vomiting 02/13/17  Hinda Kehr, MD  ondansetron (ZOFRAN) 4 MG tablet Take 1 tablet (4 mg total) by mouth every 8 (eight) hours as needed for nausea or vomiting. 03/03/17   Rudene Re, MD    Allergies Patient has no known allergies.  Family History  Problem Relation Age of Onset  . Colon polyps Father   . Cancer Father     ?  . Diabetes Son   . Diabetes Sister     Social History Social History  Substance Use Topics  . Smoking status: Current Every Day Smoker    Packs/day: 1.00    Years: 5.00    Types: Cigarettes  . Smokeless tobacco: Never Used  .  Alcohol use 0.0 oz/week    2 - 3 Standard drinks or equivalent per week     Comment: occasionally    Review of Systems  Constitutional: Negative for fever. Eyes: Negative for visual changes. ENT: Negative for sore throat. Neck: No neck pain  Cardiovascular: Negative for chest pain. Respiratory: + shortness of breath. Gastrointestinal: Negative for abdominal pain or diarrhea. + N/V  Genitourinary: Negative for dysuria. Musculoskeletal: + b/l lower back pain. Skin: Negative for rash. Neurological: Negative for headaches, weakness or numbness. Psych: No SI or HI  ____________________________________________   PHYSICAL EXAM:  VITAL SIGNS: ED Triage Vitals  Enc Vitals Group     BP 03/03/17 1909 131/80     Pulse Rate 03/03/17 1909 (!) 126     Resp 03/03/17 1909 18     Temp 03/03/17 1909 98.2 F (36.8 C)     Temp Source 03/03/17 1909 Oral     SpO2 03/03/17 1909 99 %     Weight 03/03/17 1918 180 lb (81.6 kg)     Height 03/03/17 1918 5\' 6"  (1.676 m)     Head Circumference --      Peak Flow --      Pain Score 03/03/17 1918 5     Pain Loc --      Pain Edu? --      Excl. in [place]? --     Constitutional: Alert and oriented. Well appearing and in no apparent distress. HEENT:      Head: Normocephalic and atraumatic.         Eyes: Conjunctivae are normal. Sclera is non-icteric. EOMI. PERRL      Mouth/Throat: Mucous membranes are moist.       Neck: Supple with no signs of meningismus. Cardiovascular: Tachycardic with regular rhythm. No murmurs, gallops, or rubs. 2+ symmetrical distal pulses are present in all extremities. No JVD. Respiratory: Normal respiratory effort. Lungs are clear to auscultation bilaterally. No wheezes, crackles, or rhonchi.  Gastrointestinal: Soft, non tender, and non distended with positive bowel sounds. No rebound or guarding. Genitourinary: No CVA tenderness. Musculoskeletal: Nontender with normal range of motion in all extremities. No edema, cyanosis, or  erythema of extremities. No CT and L-spine tenderness. Patient is mildly tender to palpation of the muscles on the lower back Neurologic: Normal speech and language. Face is symmetric. Moving all extremities. No gross focal neurologic deficits are appreciated. Skin: Skin is warm, dry and intact. No rash noted. Psychiatric: Mood and affect are normal. Speech and behavior are normal.  ____________________________________________   LABS (all labs ordered are listed, but only abnormal results are displayed)  Labs Reviewed  GLUCOSE, CAPILLARY - Abnormal; Notable for the following:       Result Value   Glucose-Capillary 372 (*)    All other components within normal  limits  COMPREHENSIVE METABOLIC PANEL - Abnormal; Notable for the following:    CO2 19 (*)    Glucose, Bld 395 (*)    Total Bilirubin 1.4 (*)    All other components within normal limits  CBC - Abnormal; Notable for the following:    WBC 13.8 (*)    RBC 5.49 (*)    Hemoglobin 16.8 (*)    HCT 49.6 (*)    All other components within normal limits  URINALYSIS, COMPLETE (UACMP) WITH MICROSCOPIC - Abnormal; Notable for the following:    Color, Urine STRAW (*)    APPearance CLEAR (*)    Glucose, UA >=500 (*)    Ketones, ur 80 (*)    Squamous Epithelial / LPF 0-5 (*)    All other components within normal limits  BLOOD GAS, VENOUS - Abnormal; Notable for the following:    Acid-base deficit 2.6 (*)    All other components within normal limits  BASIC METABOLIC PANEL - Abnormal; Notable for the following:    Glucose, Bld 220 (*)    Calcium 8.8 (*)    All other components within normal limits  GLUCOSE, CAPILLARY - Abnormal; Notable for the following:    Glucose-Capillary 205 (*)    All other components within normal limits  LIPASE, BLOOD  PREGNANCY, URINE   ____________________________________________  EKG  none  ____________________________________________  RADIOLOGY  CXR:  Negative ____________________________________________   PROCEDURES  Procedure(s) performed: None Procedures Critical Care performed:  None ____________________________________________   INITIAL IMPRESSION / ASSESSMENT AND PLAN / ED COURSE  41 y.o. female with a history of type 1 diabetes and multiple prior episodes of DKA who presents for evaluation of nausea, vomiting, and elevated blood glucose. Patient is well-appearing, in no distress, she is tachycardic with heart rate of 126, afebrile with normal blood pressure. Blood work shows leukocytosis with white count of 13.8 and however all cell lines are up concerning for hemoconcentration in the setting of vomiting. UA with glucose and small amount of ketones but no evidence of urinary tract infection. CMP showing glucose of 395 with a bicarbonate of 19, normal anion gap and electrolytes. Normal lipase. Will check VBG, give IVF, IV zofran, and IV insulin. Back pain probably muscular as it is reproducible on palpation, no flank pain, no midline ttp, no evidence of UTI.   Clinical Course as of Mar 04 2219  Wed Mar 03, 2017  2127 Patient feeling markedly improved. No longer vomiting. VBG with normal pH and bicarbonate. 1L NS done. Repeat CBG 205. Will repeat BMP.  [CV]  2219 BMP improvement with normal bicarbonate and creatinine. Patient is tolerating by mouth. VS WNL. We'll discharge home at this time.  [CV]    Clinical Course User Index [CV] Rudene Re, MD    Pertinent labs & imaging results that were available during my care of the patient were reviewed by me and considered in my medical decision making (see chart for details).    ____________________________________________   FINAL CLINICAL IMPRESSION(S) / ED DIAGNOSES  Final diagnoses:  Hyperglycemia due to type 1 diabetes mellitus (HCC)  Non-intractable vomiting with nausea, unspecified vomiting type      NEW MEDICATIONS STARTED DURING THIS VISIT:  New  Prescriptions   ONDANSETRON (ZOFRAN) 4 MG TABLET    Take 1 tablet (4 mg total) by mouth every 8 (eight) hours as needed for nausea or vomiting.     Note:  This document was prepared using Systems analyst and may include  unintentional dictation errors.    Rudene Re, MD 03/03/17 2220

## 2017-03-03 NOTE — ED Triage Notes (Signed)
Pt to triage via wheelchair.  Pt reports high blood sugar and emesis.  No diarrhea.  Pt reports low back pain.  Pt alert   Speech clear

## 2017-03-03 NOTE — Discharge Instructions (Signed)
Take her insulin as prescribed. Take Zofran as needed for nausea or vomiting. Follow up with her primary care doctor in 1-2 days. Return to the emergency room if you have abdominal pain, or unable to keep herself hydrated, chest pain, fever, or any other symptoms concerning to you.

## 2017-03-05 LAB — BLOOD GAS, VENOUS
Acid-base deficit: 2.6 mmol/L — ABNORMAL HIGH (ref 0.0–2.0)
BICARBONATE: 23.7 mmol/L (ref 20.0–28.0)
PATIENT TEMPERATURE: 37
PCO2 VEN: 46 mmHg (ref 44.0–60.0)
pH, Ven: 7.32 (ref 7.250–7.430)

## 2017-07-22 ENCOUNTER — Encounter: Payer: Self-pay | Admitting: Physical Therapy

## 2017-07-22 ENCOUNTER — Ambulatory Visit: Payer: Medicaid Other | Attending: Orthopedic Surgery | Admitting: Physical Therapy

## 2017-07-22 DIAGNOSIS — Z9889 Other specified postprocedural states: Secondary | ICD-10-CM | POA: Insufficient documentation

## 2017-07-22 DIAGNOSIS — M25511 Pain in right shoulder: Secondary | ICD-10-CM | POA: Diagnosis present

## 2017-07-22 DIAGNOSIS — M25611 Stiffness of right shoulder, not elsewhere classified: Secondary | ICD-10-CM | POA: Insufficient documentation

## 2017-07-22 DIAGNOSIS — M6281 Muscle weakness (generalized): Secondary | ICD-10-CM | POA: Diagnosis present

## 2017-07-22 NOTE — Therapy (Signed)
[place], [place], [address] Phone: [phone]   Fax:  [phone]  Physical Therapy Evaluation  Patient Details  Name: Sheryl White MRN: [id number] Date of Birth: [birth year] Referring Provider: Netta Cedars, MD  Encounter Date: 07/22/2017      PT End of Session - 07/22/17 0856    Visit Number 1   Number of Visits 4   Date for PT Re-Evaluation 09/03/17   Authorization Type Medicaid, waiting for auth    PT Start Time 0845   PT Stop Time 0934   PT Time Calculation (min) 49 min   Activity Tolerance Patient tolerated treatment well   Behavior During Therapy [facility] for tasks assessed/performed      Past Medical History:  Diagnosis Date  . Angina   . Anxiety   . Depression   . Diabetes mellitus   . Dyslipidemia   . Endometriosis   . GERD (gastroesophageal reflux disease)   . Ovarian cyst     Past Surgical History:  Procedure Laterality Date  . ABDOMINAL HYSTERECTOMY     still have left ovary  . CARPAL TUNNEL RELEASE     bilateral  . CESAREAN SECTION     x 1  . COLONOSCOPY    . ENDOMETRIAL ABLATION    . LAPAROSCOPIC SALPINGOOPHERECTOMY    . svd     x 1  . TRIGGER FINGER RELEASE     bilateral  . TUBAL LIGATION    . UPPER GASTROINTESTINAL ENDOSCOPY    . WISDOM TOOTH EXTRACTION      There were no vitals filed for this visit.       Subjective Assessment - 07/22/17 0856    Subjective Surgery on 8/6. Wants to get back to "normal"  Dizzziness with medications    Patient Stated Goals reaching, dressing, ROM   Currently in Pain? Yes   Pain Score 3    Pain Location Shoulder   Pain Orientation Right   Pain Descriptors / Indicators Aching;Sharp   Pain Type Surgical pain   Pain Onset In the past 7 days   Pain Frequency Constant   Aggravating Factors  moving arm, laying supine.    Pain Relieving Factors ice            [facility] PT Assessment - 07/22/17 0001      Assessment    Medical Diagnosis s/p R RC surgery   Referring Provider Netta Cedars, MD   Onset Date/Surgical Date 07/19/17   Hand Dominance Right   Next MD Visit 8/21   Prior Therapy no     Precautions   Precautions Shoulder   Precaution Comments no abduction     Restrictions   Weight Bearing Restrictions No     Balance Screen   Has the patient fallen in the past 6 months No     [place] residence     Prior Function   Level of Independence Needs assistance with ADLs     Cognition   Overall Cognitive Status Within Functional Limits for tasks assessed     Observation/Other Assessments   Focus on Therapeutic Outcomes (FOTO)  84% limitation     Sensation   Additional Comments WFL     ROM / Strength   AROM / PROM / Strength PROM;Strength     PROM   Overall PROM Comments NO ABD allowed at eval   PROM Assessment Site Shoulder   Right/Left Shoulder Right  Right Shoulder Flexion 90 Degrees   Right Shoulder ABduction 0 Degrees   Right Shoulder External Rotation 40 Degrees     Strength   Overall Strength Comments UA to test due to limitations            Objective measurements completed on examination: See above findings.          [place] Adult PT Treatment/Exercise - 07/22/17 0001      Therapeutic Activites    Therapeutic Activities Other Therapeutic Activities   Other Therapeutic Activities teaching PROM     Exercises   Exercises Shoulder     Shoulder Exercises: Supine   External Rotation Limitations AAROM with wand     Shoulder Exercises: ROM/Strengthening   Pendulum cues for form     Shoulder Exercises: Isometric Strengthening   Flexion Limitations towel at wall   Extension Limitations towel at wall   External Rotation Limitations towel at wall   Internal Rotation Limitations towel at wall     Manual Therapy   Manual Therapy Passive ROM   Passive ROM flexion, ER                PT Education - 07/22/17 1024     Education provided Yes   Education Details anatomy of condition, POC, HEP, exercise form/rationale, teaching caretaker ROM   Person(s) Educated Patient;Caregiver(s)   Methods Explanation;Demonstration;Tactile cues;Verbal cues;Handout   Comprehension Verbalized understanding;Returned demonstration;Verbal cues required;Tactile cues required;Need further instruction             PT Long Term Goals - 07/22/17 1040      PT LONG TERM GOAL #1   Title AAROM flexion and ER withhin 10 deg of L UE to progress toward functional ROM   Baseline PROM only at eval per post op restrictions   Time 6   Period Weeks   Status New   Target Date 09/03/17     PT LONG TERM GOAL #2   Title Pt will verbalize independence in dressing   Baseline requires assistance at eval   Time 6   Period Weeks   Status New   Target Date 09/03/17     PT LONG TERM GOAL #3   Title FOTO to improve by at least 20% for improved functional ability   Baseline 84% limited at eval, goal 68%   Time 6   Period Weeks   Status New   Target Date 09/03/17     PT LONG TERM GOAL #4   Title Pt will be independent in HEP for continued strengthening of GHJ   Baseline will progress and establish as appropriate   Time 6   Period Weeks   Status New   Target Date 09/03/17                Plan - 07/22/17 1033    Clinical Impression Statement Date of surgery 07/19/17, CPT 02409, 73532. Pt presents to PT s/p R shoulder surgery with complaints of pain and limited functional use. No abduction at this time per MD. Expected 3 visits approved through [facility] which we will spread out to progress HEP as appropriate. PROM tolerance to 90 flx and 40 ER. Taught caregiver how to perform PROM and gave wand exercises for AAROM to begin on Monday. Pt will benefit from skilled PT in order to reach long term goals and establish appropraite HEP for long term care.    History and Personal Factors relevant to plan of care: depression, anxiety    Clinical Presentation  Evolving   Clinical Presentation due to: recent surgery   Clinical Decision Making Low   Rehab Potential Good   PT Frequency --  1 next week and then possibly spread to every other week for other 2 appointments   PT Treatment/Interventions ADLs/Self Care Home Management;Cryotherapy;Electrical Stimulation;Functional mobility training;Moist Heat;Therapeutic activities;Therapeutic exercise;Neuromuscular re-education;Patient/family education;Passive range of motion;Manual techniques;Taping   PT Next Visit Plan no ABD, cont isometrics, increase AAROM if tolerated, elbow/wrist; give [facility] clinic info.    PT Home Exercise Plan shoulder iso flx, ext, IR, ER; elbow/wrist movements & ball squeeze, pendulums, PROM flx & ER, AAROM flx & ER with wand. table slides flexion stretch   Consulted and Agree with Plan of Care Patient;Family member/caregiver      Patient will benefit from skilled therapeutic intervention in order to improve the following deficits and impairments:  Decreased range of motion, Impaired UE functional use, Decreased activity tolerance, Pain, Impaired flexibility, Hypomobility, Decreased strength, Improper body mechanics  Visit Diagnosis: S/P rotator cuff surgery - Plan: PT plan of care cert/re-cert  Acute pain of right shoulder - Plan: PT plan of care cert/re-cert  Stiffness of right shoulder, not elsewhere classified - Plan: PT plan of care cert/re-cert  Muscle weakness (generalized) - Plan: PT plan of care cert/re-cert     Problem List Patient Active Problem List   Diagnosis Date Noted  . ARF (acute renal failure) ([place]) 01/31/2016  . Nausea & vomiting 01/31/2016  . DKA, type 1 ([place]) 01/31/2016  . Emesis   . DKA (diabetic ketoacidoses) ([place]) 03/31/2015  . UTI (lower urinary tract infection) 03/30/2015  . Diabetic ketoacidosis, type I ([place]) 10/07/2013    Class: Acute  . Abdominal pain, right lower quadrant 01/09/2012  . CHEST PAIN UNSPECIFIED  11/18/2009  . ABDOMINAL PAIN, EPIGASTRIC 09/12/2009  . DM (diabetes mellitus), type 1 ([place]) 06/01/2008  . DM 05/31/2008  . DYSLIPIDEMIA 05/31/2008  . DEPRESSION 05/31/2008  . GERD 05/31/2008  . RECTAL BLEEDING 05/31/2008  . BLOOD IN STOOL 05/31/2008  . DIARRHEA 05/31/2008   Jomaira Darr C. Heran Campau PT, DPT 07/22/17 5:36 PM   [facility] [address] [place], [place], [address] Phone: [phone]   Fax:  [phone]  Name: Sheryl White MRN: [id number] Date of Birth: 1976-08-03

## 2017-07-22 NOTE — Patient Instructions (Signed)
   Internal rotation isometric

## 2017-07-30 ENCOUNTER — Encounter: Payer: Self-pay | Admitting: Physical Therapy

## 2017-07-30 ENCOUNTER — Ambulatory Visit: Payer: Medicaid Other | Admitting: Physical Therapy

## 2017-07-30 DIAGNOSIS — M25511 Pain in right shoulder: Secondary | ICD-10-CM

## 2017-07-30 DIAGNOSIS — M6281 Muscle weakness (generalized): Secondary | ICD-10-CM

## 2017-07-30 DIAGNOSIS — Z9889 Other specified postprocedural states: Secondary | ICD-10-CM | POA: Diagnosis not present

## 2017-07-30 DIAGNOSIS — M25611 Stiffness of right shoulder, not elsewhere classified: Secondary | ICD-10-CM

## 2017-07-30 NOTE — Therapy (Signed)
[place], [place], [address] Phone: [phone]   Fax:  [phone]  Physical Therapy Treatment  Patient Details  Name: Sheryl White MRN: [id number] Date of Birth: 06-Jan-1976 Referring Provider: Netta Cedars, MD  Encounter Date: 07/30/2017      PT End of Session - 07/30/17 1147    Visit Number 2   Number of Visits 4   Date for PT Re-Evaluation 09/03/17   Authorization Type Medicaid, waiting for auth    PT Start Time 1146   PT Stop Time 1225   PT Time Calculation (min) 39 min   Activity Tolerance Patient tolerated treatment well   Behavior During Therapy [facility] for tasks assessed/performed      Past Medical History:  Diagnosis Date  . Angina   . Anxiety   . Depression   . Diabetes mellitus   . Dyslipidemia   . Endometriosis   . GERD (gastroesophageal reflux disease)   . Ovarian cyst     Past Surgical History:  Procedure Laterality Date  . ABDOMINAL HYSTERECTOMY     still have left ovary  . CARPAL TUNNEL RELEASE     bilateral  . CESAREAN SECTION     x 1  . COLONOSCOPY    . ENDOMETRIAL ABLATION    . LAPAROSCOPIC SALPINGOOPHERECTOMY    . svd     x 1  . TRIGGER FINGER RELEASE     bilateral  . TUBAL LIGATION    . UPPER GASTROINTESTINAL ENDOSCOPY    . WISDOM TOOTH EXTRACTION      There were no vitals filed for this visit.                       [place] Adult PT Treatment/Exercise - 07/30/17 0001      Shoulder Exercises: Standing   Other Standing Exercises scapular retraction     Shoulder Exercises: Isometric Strengthening   Flexion Limitations towel at wall   Extension Limitations towel at wall   External Rotation Limitations towel at wall   Internal Rotation Limitations towel at wall     Shoulder Exercises: Stretch   Other Shoulder Stretches pectoralis stretch atdoor-external rotation motion 0 deg abd     Manual Therapy   Manual therapy comments scar  mobilization, edema mobilization around shoulder   Passive ROM flexion, external rotation                PT Education - 07/30/17 1316    Education provided Yes   Education Details protocol, incision healing, wear of sling, [facility] clinic after visits run out   [employer]) Educated Patient;Caregiver(s)   Methods Explanation;Demonstration;Verbal cues;Tactile cues   Comprehension Verbalized understanding;Returned demonstration;Verbal cues required;Tactile cues required;Need further instruction             PT Long Term Goals - 07/22/17 1040      PT LONG TERM GOAL #1   Title AAROM flexion and ER withhin 10 deg of L UE to progress toward functional ROM   Baseline PROM only at eval per post op restrictions   Time 6   Period Weeks   Status New   Target Date 09/03/17     PT LONG TERM GOAL #2   Title Pt will verbalize independence in dressing   Baseline requires assistance at eval   Time 6   Period Weeks   Status New   Target Date 09/03/17     PT LONG TERM GOAL #3  Title FOTO to improve by at least 20% for improved functional ability   Baseline 84% limited at eval, goal 68%   Time 6   Period Weeks   Status New   Target Date 09/03/17     PT LONG TERM GOAL #4   Title Pt will be independent in HEP for continued strengthening of GHJ   Baseline will progress and establish as appropriate   Time 6   Period Weeks   Status New   Target Date 09/03/17               Plan - 07/30/17 1312    Clinical Impression Statement Pt with minimal pain and good PROM, she has met goals for early phases of protocol and will return to MD for follow up next week. Due to good progression, her next 2 apts will be spread over 4 weeks. I gave the pt her protocol to take to f/u where surgeon can choose if she will be allowed to progress in protocol.    PT Treatment/Interventions ADLs/Self Care Home Management;Cryotherapy;Electrical Stimulation;Functional mobility training;Moist  Heat;Therapeutic activities;Therapeutic exercise;Neuromuscular re-education;Patient/family education;Passive range of motion;Manual techniques;Taping   PT Next Visit Plan no ABD, cont isometrics, increase AAROM if tolerated, elbow/wrist; per protocol, did MD change?   PT Home Exercise Plan shoulder iso flx, ext, IR, ER; elbow/wrist movements & ball squeeze, pendulums, PROM flx & ER, AAROM flx & ER with wand. table slides flexion stretch   Consulted and Agree with Plan of Care Patient;Family member/caregiver      Patient will benefit from skilled therapeutic intervention in order to improve the following deficits and impairments:  Decreased range of motion, Impaired UE functional use, Decreased activity tolerance, Pain, Impaired flexibility, Hypomobility, Decreased strength, Improper body mechanics  Visit Diagnosis: Acute pain of right shoulder  Stiffness of right shoulder, not elsewhere classified  Muscle weakness (generalized)     Problem List Patient Active Problem List   Diagnosis Date Noted  . ARF (acute renal failure) ([place]) 01/31/2016  . Nausea & vomiting 01/31/2016  . DKA, type 1 ([place]) 01/31/2016  . Emesis   . DKA (diabetic ketoacidoses) ([place]) 03/31/2015  . UTI (lower urinary tract infection) 03/30/2015  . Diabetic ketoacidosis, type I ([place]) 10/07/2013    Class: Acute  . Abdominal pain, right lower quadrant 01/09/2012  . CHEST PAIN UNSPECIFIED 11/18/2009  . ABDOMINAL PAIN, EPIGASTRIC 09/12/2009  . DM (diabetes mellitus), type 1 ([place]) 06/01/2008  . DM 05/31/2008  . DYSLIPIDEMIA 05/31/2008  . DEPRESSION 05/31/2008  . GERD 05/31/2008  . RECTAL BLEEDING 05/31/2008  . BLOOD IN STOOL 05/31/2008  . DIARRHEA 05/31/2008    Shawndrea Rutkowski C. Amadi Frady PT, DPT 07/30/17 1:16 PM   [facility] [address] [place], [place], [address] Phone: [phone]   Fax:  [phone]  Name: Sheryl White MRN: [id number] Date of Birth:  13-Dec-1976

## 2017-08-06 ENCOUNTER — Encounter: Payer: Medicaid Other | Admitting: Physical Therapy

## 2017-08-06 ENCOUNTER — Ambulatory Visit (HOSPITAL_COMMUNITY): Admission: RE | Admit: 2017-08-06 | Payer: Medicaid Other | Source: Ambulatory Visit | Admitting: Orthopedic Surgery

## 2017-08-06 ENCOUNTER — Encounter (HOSPITAL_COMMUNITY): Admission: RE | Payer: Self-pay | Source: Ambulatory Visit

## 2017-08-06 SURGERY — SHOULDER ARTHROSCOPY WITH ROTATOR CUFF REPAIR AND OPEN BICEPS TENODESIS
Anesthesia: General | Site: Shoulder | Laterality: Right

## 2017-08-13 ENCOUNTER — Encounter: Payer: Self-pay | Admitting: Physical Therapy

## 2017-08-13 ENCOUNTER — Ambulatory Visit: Payer: Medicaid Other | Admitting: Physical Therapy

## 2017-08-13 DIAGNOSIS — Z9889 Other specified postprocedural states: Secondary | ICD-10-CM | POA: Diagnosis not present

## 2017-08-13 DIAGNOSIS — M6281 Muscle weakness (generalized): Secondary | ICD-10-CM

## 2017-08-13 DIAGNOSIS — M25611 Stiffness of right shoulder, not elsewhere classified: Secondary | ICD-10-CM

## 2017-08-13 DIAGNOSIS — M25511 Pain in right shoulder: Secondary | ICD-10-CM

## 2017-08-13 NOTE — Therapy (Signed)
[place], [place], [address] Phone: [phone]   Fax:  [phone]  Physical Therapy Treatment  Patient Details  Name: Sheryl White MRN: [id number] Date of Birth: [birth year] Referring Provider: Netta Cedars, MD  Encounter Date: 08/13/2017      PT End of Session - 08/13/17 1018    Visit Number 3   Number of Visits 4   Date for PT Re-Evaluation 09/03/17   PT Start Time 5809   PT Stop Time 1058   PT Time Calculation (min) 40 min   Activity Tolerance Patient tolerated treatment well   Behavior During Therapy [facility] for tasks assessed/performed      Past Medical History:  Diagnosis Date  . Angina   . Anxiety   . Depression   . Diabetes mellitus   . Dyslipidemia   . Endometriosis   . GERD (gastroesophageal reflux disease)   . Ovarian cyst     Past Surgical History:  Procedure Laterality Date  . ABDOMINAL HYSTERECTOMY     still have left ovary  . CARPAL TUNNEL RELEASE     bilateral  . CESAREAN SECTION     x 1  . COLONOSCOPY    . ENDOMETRIAL ABLATION    . LAPAROSCOPIC SALPINGOOPHERECTOMY    . svd     x 1  . TRIGGER FINGER RELEASE     bilateral  . TUBAL LIGATION    . UPPER GASTROINTESTINAL ENDOSCOPY    . WISDOM TOOTH EXTRACTION      There were no vitals filed for this visit.      Subjective Assessment - 08/13/17 1018    Subjective pain every once in a while, mainly when she wakes up   Patient Stated Goals reaching, dressing, ROM   Currently in Pain? No/denies                         [facility] Adult PT Treatment/Exercise - 08/13/17 0001      Shoulder Exercises: Seated   Flexion 15 reps   Flexion Limitations press out on yellow tband   Other Seated Exercises scaption to 90     Shoulder Exercises: Prone   Retraction 10 reps   Retraction Limitations on table & from edge of bed   Flexion 15 reps   Flexion Limitations off edge of bed   Extension 10 reps   retraction +ext, on table + off edge   Horizontal ABduction 1 15 reps   Horizontal ABduction 1 Limitations neutral     Shoulder Exercises: Sidelying   External Rotation 20 reps   ABduction 20 reps     Shoulder Exercises: Standing   Other Standing Exercises plyo ball ABCs on counter   Other Standing Exercises triceps kicks     Shoulder Exercises: Stretch   Other Shoulder Stretches upper trap & levator     Manual Therapy   Manual therapy comments noted full in all ranges                PT Education - 08/13/17 1244    Education provided Yes   Education Details exercise form/rationale   Person(s) Educated Patient   Methods Explanation;Demonstration;Tactile cues;Verbal cues;Handout   Comprehension Verbalized understanding;Returned demonstration;Verbal cues required;Tactile cues required;Need further instruction             PT Long Term Goals - 07/22/17 1040      PT LONG TERM GOAL #1   Title AAROM flexion and  ER withhin 10 deg of L UE to progress toward functional ROM   Baseline PROM only at eval per post op restrictions   Time 6   Period Weeks   Status New   Target Date 09/03/17     PT LONG TERM GOAL #2   Title Pt will verbalize independence in dressing   Baseline requires assistance at eval   Time 6   Period Weeks   Status New   Target Date 09/03/17     PT LONG TERM GOAL #3   Title FOTO to improve by at least 20% for improved functional ability   Baseline 84% limited at eval, goal 68%   Time 6   Period Weeks   Status New   Target Date 09/03/17     PT LONG TERM GOAL #4   Title Pt will be independent in HEP for continued strengthening of GHJ   Baseline will progress and establish as appropriate   Time 6   Period Weeks   Status New   Target Date 09/03/17               Plan - 08/13/17 1059    Clinical Impression Statement Soreness in lateral shoulder noted with flexion AROM and mild biceps discomfort in triceps kicks. Full ROM avail  passively. Pt educated on importance of slowing use of arm to allow full healing even though her pain levels are so low.    PT Treatment/Interventions ADLs/Self Care Home Management;Cryotherapy;Electrical Stimulation;Functional mobility training;Moist Heat;Therapeutic activities;Therapeutic exercise;Neuromuscular re-education;Patient/family education;Passive range of motion;Manual techniques;Taping   PT Next Visit Plan per protocol   PT Home Exercise Plan shoulder iso flx, ext, IR, ER; elbow/wrist movements & ball squeeze, pendulums, PROM flx & ER, AAROM flx & ER with wand. table slides flexion stretch; prone exercises, triceps ext, AROm flx, upper trap & levator stretch   Consulted and Agree with Plan of Care Patient      Patient will benefit from skilled therapeutic intervention in order to improve the following deficits and impairments:  Decreased range of motion, Impaired UE functional use, Decreased activity tolerance, Pain, Impaired flexibility, Hypomobility, Decreased strength, Improper body mechanics  Visit Diagnosis: Acute pain of right shoulder  Stiffness of right shoulder, not elsewhere classified  Muscle weakness (generalized)     Problem List Patient Active Problem List   Diagnosis Date Noted  . ARF (acute renal failure) ([place]) 01/31/2016  . Nausea & vomiting 01/31/2016  . DKA, type 1 ([place]) 01/31/2016  . Emesis   . DKA (diabetic ketoacidoses) ([place]) 03/31/2015  . UTI (lower urinary tract infection) 03/30/2015  . Diabetic ketoacidosis, type I ([place]) 10/07/2013    Class: Acute  . Abdominal pain, right lower quadrant 01/09/2012  . CHEST PAIN UNSPECIFIED 11/18/2009  . ABDOMINAL PAIN, EPIGASTRIC 09/12/2009  . DM (diabetes mellitus), type 1 ([place]) 06/01/2008  . DM 05/31/2008  . DYSLIPIDEMIA 05/31/2008  . DEPRESSION 05/31/2008  . GERD 05/31/2008  . RECTAL BLEEDING 05/31/2008  . BLOOD IN STOOL 05/31/2008  . DIARRHEA 05/31/2008   Trig Mcbryar C. Nevayah Faust PT, DPT 08/13/17 12:46  PM   [facility] [address] [place], [place], [address] Phone: [phone]   Fax:  [phone]  Name: DESTINY HAGIN MRN: [id number] Date of Birth: Jun 22, 1976

## 2017-08-26 ENCOUNTER — Ambulatory Visit: Payer: Medicaid Other | Attending: Orthopedic Surgery | Admitting: Physical Therapy

## 2017-08-26 ENCOUNTER — Encounter: Payer: Self-pay | Admitting: Physical Therapy

## 2017-08-26 DIAGNOSIS — M25511 Pain in right shoulder: Secondary | ICD-10-CM | POA: Diagnosis present

## 2017-08-26 DIAGNOSIS — M25611 Stiffness of right shoulder, not elsewhere classified: Secondary | ICD-10-CM | POA: Diagnosis present

## 2017-08-26 DIAGNOSIS — M6281 Muscle weakness (generalized): Secondary | ICD-10-CM | POA: Diagnosis present

## 2017-08-26 NOTE — Therapy (Signed)
[place], [place], [address] Phone: [phone]   Fax:  [phone]  Physical Therapy Treatment/Discharge Summary  Patient Details  Name: Sheryl White MRN: [id number] Date of Birth: 1976/02/08 Referring Provider: Netta Cedars, MD  Encounter Date: 08/26/2017      PT End of Session - 08/26/17 0806    Visit Number 4   Number of Visits 4   Date for PT Re-Evaluation 09/03/17   PT Start Time 0802   PT Stop Time 0840   PT Time Calculation (min) 38 min   Activity Tolerance Patient tolerated treatment well   Behavior During Therapy [facility] for tasks assessed/performed      Past Medical History:  Diagnosis Date  . Angina   . Anxiety   . Depression   . Diabetes mellitus   . Dyslipidemia   . Endometriosis   . GERD (gastroesophageal reflux disease)   . Ovarian cyst     Past Surgical History:  Procedure Laterality Date  . ABDOMINAL HYSTERECTOMY     still have left ovary  . CARPAL TUNNEL RELEASE     bilateral  . CESAREAN SECTION     x 1  . COLONOSCOPY    . ENDOMETRIAL ABLATION    . LAPAROSCOPIC SALPINGOOPHERECTOMY    . svd     x 1  . TRIGGER FINGER RELEASE     bilateral  . TUBAL LIGATION    . UPPER GASTROINTESTINAL ENDOSCOPY    . WISDOM TOOTH EXTRACTION      There were no vitals filed for this visit.      Subjective Assessment - 08/26/17 0806    Subjective only complaint is pain/difficulty reaching in horiz adduction    Currently in Pain? No/denies                         [facility] Adult PT Treatment/Exercise - 08/26/17 0001      Shoulder Exercises: Prone   Flexion 15 reps   Extension 15 reps  EOB, cues to decrease elevation   Horizontal ABduction 1 15 reps   Horizontal ABduction 1 Limitations neutral   Other Prone Exercises prone row   Other Prone Exercises triceps kicks     Shoulder Exercises: Sidelying   External Rotation 20 reps   External Rotation Weight (lbs)  1   ABduction 20 reps     Shoulder Exercises: Standing   Other Standing Exercises wall slides flex & abd with rainbows at end ranges                     PT Long Term Goals - 08/26/17 0807      PT LONG TERM GOAL #1   Title AAROM flexion and ER withhin 10 deg of L UE to progress toward functional ROM   Baseline flx R 158 L 157; ER R 68 L68   Status Achieved     PT LONG TERM GOAL #2   Title Pt will verbalize independence in dressing   Baseline able   Status Achieved     PT LONG TERM GOAL #3   Title FOTO to improve by at least 20% for improved functional ability   Baseline 30% limitation   Status Achieved     PT LONG TERM GOAL #4   Title Pt will be independent in HEP for continued strengthening of GHJ   Baseline achieved   Status Achieved  Plan - 08/26/17 0842    Clinical Impression Statement pt has met all of her goals at this time and is d/c to independent program. Education emphasized importance of avoiding resisted supination, biceps activation and biceps impingement even with low levels of pain. Pt verbalized comfort and understanding and was encouraged to contact us with any further questions.    PT Treatment/Interventions ADLs/Self Care Home Management;Cryotherapy;Electrical Stimulation;Functional mobility training;Moist Heat;Therapeutic activities;Therapeutic exercise;Neuromuscular re-education;Patient/family education;Passive range of motion;Manual techniques;Taping   PT Home Exercise Plan shoulder iso flx, ext, IR, ER; elbow/wrist movements & ball squeeze, pendulums, PROM flx & ER, AAROM flx & ER with wand. table slides flexion stretch; prone exercises, triceps ext, AROm flx, upper trap & levator stretch; wall slides   Consulted and Agree with Plan of Care Patient      Patient will benefit from skilled therapeutic intervention in order to improve the following deficits and impairments:  Decreased range of motion, Impaired UE functional  use, Decreased activity tolerance, Pain, Impaired flexibility, Hypomobility, Decreased strength, Improper body mechanics  Visit Diagnosis: Acute pain of right shoulder  Stiffness of right shoulder, not elsewhere classified  Muscle weakness (generalized)     Problem List Patient Active Problem List   Diagnosis Date Noted  . ARF (acute renal failure) ([place]) 01/31/2016  . Nausea & vomiting 01/31/2016  . DKA, type 1 ([place]) 01/31/2016  . Emesis   . DKA (diabetic ketoacidoses) ([place]) 03/31/2015  . UTI (lower urinary tract infection) 03/30/2015  . Diabetic ketoacidosis, type I ([place]) 10/07/2013    Class: Acute  . Abdominal pain, right lower quadrant 01/09/2012  . CHEST PAIN UNSPECIFIED 11/18/2009  . ABDOMINAL PAIN, EPIGASTRIC 09/12/2009  . DM (diabetes mellitus), type 1 ([place]) 06/01/2008  . DM 05/31/2008  . DYSLIPIDEMIA 05/31/2008  . DEPRESSION 05/31/2008  . GERD 05/31/2008  . RECTAL BLEEDING 05/31/2008  . BLOOD IN STOOL 05/31/2008  . DIARRHEA 05/31/2008   PHYSICAL THERAPY DISCHARGE SUMMARY  Visits from Start of Care: 4  Current functional level related to goals / functional outcomes: See above   Remaining deficits: See above   Education / Equipment: Anatomy of condition, POC, HEP, exercise form/rationale  Plan: Patient agrees to discharge.  Patient goals were met. Patient is being discharged due to meeting the stated rehab goals.  ?????    Medicaid authorization limited to 1 eval + 3 visits.   Derik Fults C. Jethro Radke PT, DPT 08/26/17 8:45 AM   [facility] [address] [place], [place], [address] Phone: [phone]   Fax:  [phone]  Name: Sheryl White MRN: [id number] Date of Birth: Jun 02, 1976

## 2017-08-27 ENCOUNTER — Encounter: Payer: Medicaid Other | Admitting: Physical Therapy

## 2017-09-05 DIAGNOSIS — E559 Vitamin D deficiency, unspecified: Secondary | ICD-10-CM | POA: Insufficient documentation

## 2017-09-05 DIAGNOSIS — E1065 Type 1 diabetes mellitus with hyperglycemia: Secondary | ICD-10-CM | POA: Insufficient documentation

## 2019-09-26 ENCOUNTER — Emergency Department (HOSPITAL_COMMUNITY)
Admission: EM | Admit: 2019-09-26 | Discharge: 2019-09-26 | Disposition: A | Discharge status: Home / Self Care | Payer: BC Managed Care – PPO | Attending: Emergency Medicine | Admitting: Emergency Medicine

## 2019-09-26 ENCOUNTER — Other Ambulatory Visit: Payer: Self-pay

## 2019-09-26 ENCOUNTER — Encounter (HOSPITAL_COMMUNITY): Payer: Self-pay | Admitting: Emergency Medicine

## 2019-09-26 ENCOUNTER — Emergency Department (HOSPITAL_COMMUNITY): Payer: BC Managed Care – PPO

## 2019-09-26 DIAGNOSIS — E109 Type 1 diabetes mellitus without complications: Secondary | ICD-10-CM | POA: Insufficient documentation

## 2019-09-26 DIAGNOSIS — M545 Low back pain, unspecified: Secondary | ICD-10-CM

## 2019-09-26 DIAGNOSIS — F17210 Nicotine dependence, cigarettes, uncomplicated: Secondary | ICD-10-CM | POA: Diagnosis not present

## 2019-09-26 DIAGNOSIS — M5441 Lumbago with sciatica, right side: Secondary | ICD-10-CM | POA: Insufficient documentation

## 2019-09-26 DIAGNOSIS — R1031 Right lower quadrant pain: Secondary | ICD-10-CM | POA: Diagnosis present

## 2019-09-26 DIAGNOSIS — I700 Atherosclerosis of aorta: Secondary | ICD-10-CM

## 2019-09-26 DIAGNOSIS — Z794 Long term (current) use of insulin: Secondary | ICD-10-CM | POA: Diagnosis not present

## 2019-09-26 LAB — CBG MONITORING, ED: Glucose-Capillary: 188 mg/dL — ABNORMAL HIGH (ref 70–99)

## 2019-09-26 LAB — COMPREHENSIVE METABOLIC PANEL
ALT: 12 U/L (ref 0–44)
AST: 13 U/L — ABNORMAL LOW (ref 15–41)
Albumin: 3.7 g/dL (ref 3.5–5.0)
Alkaline Phosphatase: 69 U/L (ref 38–126)
Anion gap: 11 (ref 5–15)
BUN: 15 mg/dL (ref 6–20)
CO2: 21 mmol/L — ABNORMAL LOW (ref 22–32)
Calcium: 8.8 mg/dL — ABNORMAL LOW (ref 8.9–10.3)
Chloride: 102 mmol/L (ref 98–111)
Creatinine, Ser: 0.95 mg/dL (ref 0.44–1.00)
GFR calc Af Amer: >60 mL/min (ref >60)
GFR calc non Af Amer: >60 mL/min (ref >60)
Glucose, Bld: 277 mg/dL — ABNORMAL HIGH (ref 70–99)
Potassium: 4.2 mmol/L (ref 3.5–5.1)
Sodium: 134 mmol/L — ABNORMAL LOW (ref 135–145)
Total Bilirubin: 0.5 mg/dL (ref 0.3–1.2)
Total Protein: 6.4 g/dL — ABNORMAL LOW (ref 6.5–8.1)

## 2019-09-26 LAB — I-STAT BETA HCG BLOOD, ED (MC, WL, AP ONLY): I-stat hCG, quantitative: <5 m[IU]/mL (ref <5)

## 2019-09-26 LAB — URINALYSIS, ROUTINE W REFLEX MICROSCOPIC
Bacteria, UA: NONE SEEN
Bilirubin Urine: NEGATIVE
Glucose, UA: >=500 mg/dL — AB
Hgb urine dipstick: NEGATIVE
Ketones, ur: NEGATIVE mg/dL
Leukocytes,Ua: NEGATIVE
Nitrite: NEGATIVE
Protein, ur: NEGATIVE mg/dL
Specific Gravity, Urine: 1.025 (ref 1.005–1.030)
pH: 6 (ref 5.0–8.0)

## 2019-09-26 LAB — CBC
HCT: 43.7 % (ref 36.0–46.0)
Hemoglobin: 14.8 g/dL (ref 12.0–15.0)
MCH: 31.3 pg (ref 26.0–34.0)
MCHC: 33.9 g/dL (ref 30.0–36.0)
MCV: 92.4 fL (ref 80.0–100.0)
Platelets: 330 10*3/uL (ref 150–400)
RBC: 4.73 MIL/uL (ref 3.87–5.11)
RDW: 11.5 % (ref 11.5–15.5)
WBC: 7.1 10*3/uL (ref 4.0–10.5)
nRBC: 0 % (ref 0.0–0.2)

## 2019-09-26 MED ORDER — METHOCARBAMOL 500 MG PO TABS: 500.0000 mg | ORAL_TABLET | Freq: Two times a day (BID) | ORAL | 0 refills | Status: AC

## 2019-09-26 MED ORDER — LIDOCAINE 5 % EX PTCH: 1.0000 | MEDICATED_PATCH | CUTANEOUS | 0 refills | Status: DC

## 2019-09-26 MED ORDER — MORPHINE SULFATE (PF) 4 MG/ML IV SOLN
4.0000 mg | Freq: Once | INTRAVENOUS | Status: AC
  Administered 2019-09-26: 4 mg via INTRAVENOUS
  Filled 2019-09-26: qty 1

## 2019-09-26 MED ORDER — NAPROXEN 375 MG PO TABS: 375.0000 mg | ORAL_TABLET | Freq: Two times a day (BID) | ORAL | 0 refills | Status: DC

## 2019-09-26 NOTE — ED Notes (Signed)
Pt ambulatory to restroom with steady gait.

## 2019-09-26 NOTE — Discharge Instructions (Signed)
You have been diagnosed today with low back pain.  At this time there does not appear to be the presence of an emergent medical condition, however there is always the potential for conditions to change. Please read and follow the below instructions.  Please return to the Emergency Department immediately for any new or worsening symptoms. Please be sure to follow up with your Primary Care Provider within one week regarding your visit today; please call their office to schedule an appointment even if you are feeling better for a follow-up visit. You may use the muscle relaxer Robaxin as prescribed to help with your symptoms.  Do not drive or operate heavy machinery while taking Robaxin as it will make you drowsy.  Do not drink alcohol or take other sedating medications while taking Robaxin as this will worsen side effects. You have been prescribed an NSAID-containing medication called Naproxen today. Take this medication with food. Do not take the medications including ibuprofen, Aleve, Advil or other NSAID-containing medications for the next 2 days.  Please be sure to drink plenty of water over the next few days. You may use the Lidoderm patch prescribed today to help with your symptoms.  This medication may be expensive, you may find replacements over-the-counter, ask your pharmacist to help you find similar over-the-counter medications that may be cheaper. You have been given a medication called morphine today.  This is a narcotic medication which may make you sleepy.  Do not drive, operate machinery or do any other potentially dangerous activities for the rest of the day as this may lead to injury of your self or others. Your CT scan today showed aortic atherosclerosis, please discuss this with your primary care provider at your next visit.  Get help right away if: You develop new bowel or bladder control problems. You have unusual weakness or numbness in your arms or legs. You develop fever or  chills You have chest pain or trouble breathing You develop nausea or vomiting. You develop abdominal pain. You feel faint. You have any new/concerning or worsening symptoms  Please read the additional information packets attached to your discharge summary.  Do not take your medicine if  develop an itchy rash, swelling in your mouth or lips, or difficulty breathing; call 911 and seek immediate emergency medical attention if this occurs.  Note: Portions of this text may have been transcribed using voice recognition software. Every effort was made to ensure accuracy; however, inadvertent computerized transcription errors may still be present.

## 2019-09-26 NOTE — ED Provider Notes (Addendum)
[facility] EMERGENCY DEPARTMENT Provider Note   CSN: [id number] Arrival date & time: 09/26/19  0117     History   Chief Complaint Chief Complaint  Patient presents with  . Back Pain    HPI Sheryl White is a 43 y.o. female with history of diabetes, kidney stone, ovarian cyst, GERD, endometriosis, anxiety/depression, presents today for right flank pain.  Patient reports 5 days of a constant right lower back/flank pain without clear aggravating or alleviating factors.  Patient reports onset while at work sitting in chair, denies any inciting event.  Reports pain feels similar to previous nephrolithiasis.  Patient has taken 800 mg ibuprofen as well as 1 unknown muscle relaxer without relief.  Denies fever/chills, headache, chest pain/shortness of breath, abdominal pain, dysuria/hematuria, vaginal bleeding/discharge, fall/injury, saddle area paresthesias, bowel/bladder incontinence, urinary retention, numbness/tingling, weakness, IV drug use, history of cancer or any additional concerns.    HPI  Past Medical History:  Diagnosis Date  . Angina   . Anxiety   . Depression   . Diabetes mellitus   . Dyslipidemia   . Endometriosis   . GERD (gastroesophageal reflux disease)   . Ovarian cyst     Patient Active Problem List   Diagnosis Date Noted  . ARF (acute renal failure) ([place]) 01/31/2016  . Nausea & vomiting 01/31/2016  . DKA, type 1 ([place]) 01/31/2016  . Emesis   . DKA (diabetic ketoacidoses) ([place]) 03/31/2015  . UTI (lower urinary tract infection) 03/30/2015  . Diabetic ketoacidosis, type I ([place]) 10/07/2013    Class: Acute  . Abdominal pain, right lower quadrant 01/09/2012  . CHEST PAIN UNSPECIFIED 11/18/2009  . ABDOMINAL PAIN, EPIGASTRIC 09/12/2009  . DM (diabetes mellitus), type 1 ([place]) 06/01/2008  . DM 05/31/2008  . DYSLIPIDEMIA 05/31/2008  . DEPRESSION 05/31/2008  . GERD 05/31/2008  . RECTAL BLEEDING 05/31/2008  . BLOOD IN STOOL 05/31/2008  .  DIARRHEA 05/31/2008    Past Surgical History:  Procedure Laterality Date  . ABDOMINAL HYSTERECTOMY     still have left ovary  . CARPAL TUNNEL RELEASE     bilateral  . CESAREAN SECTION     x 1  . COLONOSCOPY    . ENDOMETRIAL ABLATION    . LAPAROSCOPIC SALPINGOOPHERECTOMY    . svd     x 1  . TRIGGER FINGER RELEASE     bilateral  . TUBAL LIGATION    . UPPER GASTROINTESTINAL ENDOSCOPY    . WISDOM TOOTH EXTRACTION       OB History    Gravida  4   Para  2   Term  1   Preterm  1   AB  2   Living  2     SAB  2   TAB      Ectopic      Multiple      Live Births               Home Medications    Prior to Admission medications   Medication Sig Start Date End Date Taking? Authorizing Provider  ALPRAZolam Duanne Moron) 0.25 MG tablet Take 0.25 mg by mouth daily as needed (panic attacks).     [provider]  insulin aspart (NOVOLOG) 100 UNIT/ML injection Inject 3-12 Units into the skin See admin instructions. Inject 3-12 units per sliding scale subcutaneously 3-4 times daily with meals    [provider]  insulin glargine (LANTUS) 100 UNIT/ML injection Inject 0.22 mLs (22 Units total) into the  skin 2 (two) times daily. 04/26/16   Fritzi Mandes, MD  lidocaine (LIDODERM) 5 % Place 1 patch onto the skin daily. Remove & Discard patch within 12 hours or as directed by MD 09/26/19   Deliah Boston, PA-C  methocarbamol (ROBAXIN) 500 MG tablet Take 1 tablet (500 mg total) by mouth 2 (two) times daily for 7 days. 09/26/19 10/03/19  Nuala Alpha A, PA-C  naproxen (NAPROSYN) 375 MG tablet Take 1 tablet (375 mg total) by mouth 2 (two) times daily. 09/26/19   Deliah Boston, PA-C  oxyCODONE-acetaminophen (PERCOCET/ROXICET) 5-325 MG tablet Take by mouth every 4 (four) hours as needed for severe pain.    [provider]    Family History Family History  Problem Relation Age of Onset  . Colon polyps Father   . Cancer Father        ?  . Diabetes  Son   . Diabetes Sister     Social History Social History   Tobacco Use  . Smoking status: Current Every Day Smoker    Packs/day: 1.00    Years: 5.00    Pack years: 5.00    Types: Cigarettes  . Smokeless tobacco: Never Used  Substance Use Topics  . Alcohol use: Yes    Alcohol/week: 2.0 - 3.0 standard drinks    Types: 2 - 3 Standard drinks or equivalent per week    Comment: occasionally  . Drug use: No     Allergies   Patient has no known allergies.   Review of Systems Review of Systems Ten systems are reviewed and are negative for acute change except as noted in the HPI   Physical Exam Updated Vital Signs BP 124/76   Pulse 67   Temp 97.7 F (36.5 C) (Oral)   Resp 14   Ht 5\' 6"  (1.676 m)   Wt 88.5 kg   LMP 02/10/2012   SpO2 100%   BMI 31.47 kg/m   Physical Exam Constitutional:      General: She is not in acute distress.    Appearance: Normal appearance. She is well-developed. She is not ill-appearing or diaphoretic.  HENT:     Head: Normocephalic and atraumatic.     Right Ear: External ear normal.     Left Ear: External ear normal.     Nose: Nose normal.  Eyes:     General: Vision grossly intact. Gaze aligned appropriately.     Pupils: Pupils are equal, round, and reactive to light.  Neck:     Musculoskeletal: Normal range of motion.     Trachea: Trachea and phonation normal. No tracheal deviation.  Cardiovascular:     Rate and Rhythm: Normal rate and regular rhythm.     Pulses:          Dorsalis pedis pulses are 2+ on the right side and 2+ on the left side.  Pulmonary:     Effort: Pulmonary effort is normal. No respiratory distress.  Abdominal:     General: There is no distension.     Palpations: Abdomen is soft.     Tenderness: There is no abdominal tenderness. There is no guarding or rebound.  Musculoskeletal: Normal range of motion.     Right lower leg: No edema.     Left lower leg: No edema.     Comments: No midline C/T/L spinal tenderness  to palpation, no deformity, crepitus, or step-off noted. No sign of injury to the neck or back. - Right-sided lumbar paraspinal muscular tenderness  to palpation.  Feet:     Right foot:     Protective Sensation: 3 sites tested. 3 sites sensed.     Left foot:     Protective Sensation: 3 sites tested. 3 sites sensed.  Skin:    General: Skin is warm and dry.  Neurological:     Mental Status: She is alert.     GCS: GCS eye subscore is 4. GCS verbal subscore is 5. GCS motor subscore is 6.     Comments: Speech is clear and goal oriented, follows commands Major Cranial nerves without deficit, no facial droop Moves extremities without ataxia, coordination intact  Psychiatric:        Behavior: Behavior normal.    ED Treatments / Results  Labs (all labs ordered are listed, but only abnormal results are displayed) Labs Reviewed  COMPREHENSIVE METABOLIC PANEL - Abnormal; Notable for the following components:      Result Value   Sodium 134 (*)    CO2 21 (*)    Glucose, Bld 277 (*)    Calcium 8.8 (*)    Total Protein 6.4 (*)    AST 13 (*)    All other components within normal limits  URINALYSIS, ROUTINE W REFLEX MICROSCOPIC - Abnormal; Notable for the following components:   Glucose, UA >=500 (*)    All other components within normal limits  CBG MONITORING, ED - Abnormal; Notable for the following components:   Glucose-Capillary 188 (*)    All other components within normal limits  URINE CULTURE  CBC  I-STAT BETA HCG BLOOD, ED ([facility], [facility], [facility] ONLY)    EKG None  Radiology Ct Renal Stone Study  Result Date: 09/26/2019 CLINICAL DATA:  Right-sided flank and back pain. Nephrolithiasis. EXAM: CT ABDOMEN AND PELVIS WITHOUT CONTRAST TECHNIQUE: Multidetector CT imaging of the abdomen and pelvis was performed following the standard protocol without IV contrast. COMPARISON:  04/01/2015 FINDINGS: Lower chest: No acute findings. Hepatobiliary:  No mass visualized on this unenhanced exam. Pancreas:  No mass or inflammatory process visualized on this unenhanced exam. Spleen:  Within normal limits in size. Adrenals/Urinary tract: No evidence of urolithiasis or hydronephrosis. Unremarkable unopacified urinary bladder. Stomach/Bowel: No evidence of obstruction, inflammatory process, or abnormal fluid collections. Vascular/Lymphatic: No pathologically enlarged lymph nodes identified. No evidence of abdominal aortic aneurysm. Aortic atherosclerosis. Reproductive: Prior hysterectomy noted. Adnexal regions are unremarkable in appearance. Other:  None. Musculoskeletal:  No suspicious bone lesions identified. IMPRESSION: No evidence of urolithiasis, hydronephrosis, or other acute findings. Aortic Atherosclerosis (ICD10-I70.0). Electronically Signed   By: Marlaine Hind M.D.   On: 09/26/2019 08:56    Procedures Procedures (including critical care time)  Medications Ordered in ED Medications  morphine 4 MG/ML injection 4 mg (4 mg Intravenous Given 09/26/19 0807)     Initial Impression / Assessment and Plan / ED Course  I have reviewed the triage vital signs and the nursing notes.  Pertinent labs & imaging results that were available during my care of the patient were reviewed by me and considered in my medical decision making (see chart for details).    Beta-hCG negative CBG 188 Urinalysis with greater than 500 glucose CMP nonacute CBC within normal limits - On assessment patient with right-sided lumbar paraspinal muscular tenderness palpation without sign of injury.  Neurovascular intact to all 4 extremities.  Patient without signs or symptoms of cauda equina.  She has no elevated risk factors for spinal epidural abscess.  Extremities neurovascular intact with equal pulses and temperature, no  abdominal pain or pulsatile masses, no signs of AAA. Suspect musculoskeletal etiology of patient's pain today but she is very concerned for nephrolithiasis as she has history of similar in the past.  Will obtain  CT renal stone study for evaluation of possible stone.  Of note patient defers pelvic examination today citing no concern for STI and will follow up with PCP/OB/GYN for pelvic examination.  Low suspicion for PID, TOA or other acute intra-abdominal/pelvic pathologies at this time based on history and physical examination. - CT Renal Stone Study:  IMPRESSION:  No evidence of urolithiasis, hydronephrosis, or other acute  findings.    Aortic Atherosclerosis (ICD10-I70.0).  - Suspect musculoskeletal etiology of patient's pain. Pain is consistently reproducible with palpation of the back musculature. Abdomen soft/nontender and without pulsatile mass. Patient with equal pedal pulses. Doubt spinal epidural abscess, cauda equina, AAA or other emergent processes at this time.  Patient is ambulatory in the emergency department without assistance. RICE protocol and pain medicine indicated and discussed with patient.   Naproxen 500mg  BID prescribed. Patient denies history of CKD or gastric ulcers/bleeding. Robaxin 500mg  BID prescribed. Patient informed to avoid driving or operating heavy machinery while taking muscle relaxer. Lidoderm patch prescribed.  On reevaluation patient resting comfortably no acute distress.  She is ambulating to and from bathroom without difficulty.  Patient with ride home from ER as she has been given morphine today.  Discussed narcotic precautions with patient she states understanding.  At this time there does not appear to be any evidence of an acute emergency medical condition and the patient appears stable for discharge with appropriate outpatient follow up. Diagnosis was discussed with patient who verbalizes understanding of care plan and is agreeable to discharge. I have discussed return precautions with patient who verbalizes understanding of return precautions. Patient encouraged to follow-up with their PCP. All questions answered.  Patient's case discussed with Dr. Langston Masker  who agrees with plan to discharge with follow-up.   Note: Portions of this report may have been transcribed using voice recognition software. Every effort was made to ensure accuracy; however, inadvertent computerized transcription errors may still be present. Final Clinical Impressions(s) / ED Diagnoses   Final diagnoses:  Acute right-sided low back pain without sciatica  Aortic atherosclerosis [facility])    ED Discharge Orders         Ordered    naproxen (NAPROSYN) 375 MG tablet  2 times daily     09/26/19 0924    methocarbamol (ROBAXIN) 500 MG tablet  2 times daily     09/26/19 0924    lidocaine (LIDODERM) 5 %  Every 24 hours     09/26/19 0924           Deliah Boston, PA-C 09/26/19 0929    Deliah Boston, PA-C 09/26/19 0930    Wyvonnia Dusky, MD 09/26/19 2041

## 2019-09-26 NOTE — ED Notes (Signed)
Patient transported to CT 

## 2019-09-26 NOTE — ED Triage Notes (Signed)
Patient with back pain that goes up and down on the right hand side.  Patient with frequency since this afternoon.  No vomiting, but did have nausea earlier over the weekend.

## 2019-09-27 LAB — URINE CULTURE: Culture: NO GROWTH

## 2020-01-27 ENCOUNTER — Encounter: Payer: Self-pay | Admitting: Intensive Care

## 2020-01-27 ENCOUNTER — Inpatient Hospital Stay
Admission: EM | Admit: 2020-01-27 | Discharge: 2020-01-30 | DRG: 638 | Disposition: A | Discharge status: Home / Self Care | Payer: BC Managed Care – PPO | Attending: Internal Medicine | Admitting: Internal Medicine

## 2020-01-27 ENCOUNTER — Other Ambulatory Visit: Payer: Self-pay

## 2020-01-27 DIAGNOSIS — E860 Dehydration: Secondary | ICD-10-CM | POA: Diagnosis present

## 2020-01-27 DIAGNOSIS — Z79899 Other long term (current) drug therapy: Secondary | ICD-10-CM | POA: Diagnosis not present

## 2020-01-27 DIAGNOSIS — Z79891 Long term (current) use of opiate analgesic: Secondary | ICD-10-CM

## 2020-01-27 DIAGNOSIS — Z9641 Presence of insulin pump (external) (internal): Secondary | ICD-10-CM | POA: Diagnosis present

## 2020-01-27 DIAGNOSIS — F17210 Nicotine dependence, cigarettes, uncomplicated: Secondary | ICD-10-CM | POA: Diagnosis present

## 2020-01-27 DIAGNOSIS — E785 Hyperlipidemia, unspecified: Secondary | ICD-10-CM | POA: Diagnosis present

## 2020-01-27 DIAGNOSIS — Z833 Family history of diabetes mellitus: Secondary | ICD-10-CM | POA: Diagnosis not present

## 2020-01-27 DIAGNOSIS — K219 Gastro-esophageal reflux disease without esophagitis: Secondary | ICD-10-CM | POA: Diagnosis present

## 2020-01-27 DIAGNOSIS — Z20822 Contact with and (suspected) exposure to covid-19: Secondary | ICD-10-CM | POA: Diagnosis present

## 2020-01-27 DIAGNOSIS — N179 Acute kidney failure, unspecified: Secondary | ICD-10-CM | POA: Diagnosis present

## 2020-01-27 DIAGNOSIS — Z794 Long term (current) use of insulin: Secondary | ICD-10-CM

## 2020-01-27 DIAGNOSIS — F419 Anxiety disorder, unspecified: Secondary | ICD-10-CM | POA: Diagnosis present

## 2020-01-27 DIAGNOSIS — R112 Nausea with vomiting, unspecified: Secondary | ICD-10-CM | POA: Diagnosis not present

## 2020-01-27 DIAGNOSIS — F329 Major depressive disorder, single episode, unspecified: Secondary | ICD-10-CM | POA: Diagnosis present

## 2020-01-27 DIAGNOSIS — E1010 Type 1 diabetes mellitus with ketoacidosis without coma: Secondary | ICD-10-CM | POA: Diagnosis present

## 2020-01-27 DIAGNOSIS — E1110 Type 2 diabetes mellitus with ketoacidosis without coma: Secondary | ICD-10-CM | POA: Diagnosis present

## 2020-01-27 LAB — BASIC METABOLIC PANEL
Anion gap: 23 — ABNORMAL HIGH (ref 5–15)
Anion gap: UNDETERMINED (ref 5–15)
BUN: 23 mg/dL — ABNORMAL HIGH (ref 6–20)
BUN: 24 mg/dL — ABNORMAL HIGH (ref 6–20)
CO2: 12 mmol/L — ABNORMAL LOW (ref 22–32)
CO2: <7 mmol/L — ABNORMAL LOW (ref 22–32)
Calcium: 9.7 mg/dL (ref 8.9–10.3)
Calcium: 9 mg/dL (ref 8.9–10.3)
Chloride: 101 mmol/L (ref 98–111)
Chloride: 106 mmol/L (ref 98–111)
Creatinine, Ser: 1.27 mg/dL — ABNORMAL HIGH (ref 0.44–1.00)
Creatinine, Ser: 1.29 mg/dL — ABNORMAL HIGH (ref 0.44–1.00)
GFR calc Af Amer: 59 mL/min — ABNORMAL LOW (ref >60)
GFR calc Af Amer: 58 mL/min — ABNORMAL LOW (ref >60)
GFR calc non Af Amer: 51 mL/min — ABNORMAL LOW (ref >60)
GFR calc non Af Amer: 50 mL/min — ABNORMAL LOW (ref >60)
Glucose, Bld: 448 mg/dL — ABNORMAL HIGH (ref 70–99)
Glucose, Bld: 429 mg/dL — ABNORMAL HIGH (ref 70–99)
Potassium: 4.8 mmol/L (ref 3.5–5.1)
Potassium: 4.5 mmol/L (ref 3.5–5.1)
Sodium: 136 mmol/L (ref 135–145)
Sodium: 139 mmol/L (ref 135–145)

## 2020-01-27 LAB — GLUCOSE, CAPILLARY
Glucose-Capillary: 365 mg/dL — ABNORMAL HIGH (ref 70–99)
Glucose-Capillary: 423 mg/dL — ABNORMAL HIGH (ref 70–99)
Glucose-Capillary: 498 mg/dL — ABNORMAL HIGH (ref 70–99)
Glucose-Capillary: 425 mg/dL — ABNORMAL HIGH (ref 70–99)
Glucose-Capillary: 353 mg/dL — ABNORMAL HIGH (ref 70–99)
Glucose-Capillary: 259 mg/dL — ABNORMAL HIGH (ref 70–99)

## 2020-01-27 LAB — BLOOD GAS, VENOUS
Acid-base deficit: 16.7 mmol/L — ABNORMAL HIGH (ref 0.0–2.0)
Bicarbonate: 9.9 mmol/L — ABNORMAL LOW (ref 20.0–28.0)
O2 Saturation: 81.7 %
Patient temperature: 37
pCO2, Ven: 26 mmHg — ABNORMAL LOW (ref 44.0–60.0)
pH, Ven: 7.19 — CL (ref 7.250–7.430)
pO2, Ven: 58 mmHg — ABNORMAL HIGH (ref 32.0–45.0)

## 2020-01-27 LAB — CBC
HCT: 48.4 % — ABNORMAL HIGH (ref 36.0–46.0)
Hemoglobin: 16 g/dL — ABNORMAL HIGH (ref 12.0–15.0)
MCH: 31.5 pg (ref 26.0–34.0)
MCHC: 33.1 g/dL (ref 30.0–36.0)
MCV: 95.3 fL (ref 80.0–100.0)
Platelets: 406 10*3/uL — ABNORMAL HIGH (ref 150–400)
RBC: 5.08 MIL/uL (ref 3.87–5.11)
RDW: 11.9 % (ref 11.5–15.5)
WBC: 25.1 10*3/uL — ABNORMAL HIGH (ref 4.0–10.5)
nRBC: 0 % (ref 0.0–0.2)

## 2020-01-27 LAB — BETA-HYDROXYBUTYRIC ACID: Beta-Hydroxybutyric Acid: >8 mmol/L — ABNORMAL HIGH (ref 0.05–0.27)

## 2020-01-27 LAB — RESPIRATORY PANEL BY RT PCR (FLU A&B, COVID)
Influenza A by PCR: NEGATIVE
Influenza B by PCR: NEGATIVE
SARS Coronavirus 2 by RT PCR: NEGATIVE

## 2020-01-27 MED ORDER — ONDANSETRON HCL 4 MG/2ML IJ SOLN
INTRAMUSCULAR | Status: AC
  Administered 2020-01-27: 20:00:00 4 mg via INTRAVENOUS
  Filled 2020-01-27: qty 2

## 2020-01-27 MED ORDER — DEXTROSE-NACL 5-0.45 % IV SOLN: INTRAVENOUS | Status: DC

## 2020-01-27 MED ORDER — SODIUM CHLORIDE 0.9 % IV BOLUS
1000.0000 mL | INTRAVENOUS | Status: AC
  Administered 2020-01-27: 20:00:00 1000 mL via INTRAVENOUS

## 2020-01-27 MED ORDER — SODIUM CHLORIDE 0.9 % IV SOLN: INTRAVENOUS | Status: DC

## 2020-01-27 MED ORDER — POTASSIUM CHLORIDE 10 MEQ/100ML IV SOLN
10.0000 meq | INTRAVENOUS | Status: AC
  Filled 2020-01-27 (×2): qty 100

## 2020-01-27 MED ORDER — ONDANSETRON HCL 4 MG/2ML IJ SOLN
4.0000 mg | Freq: Once | INTRAMUSCULAR | Status: AC
  Filled 2020-01-27: qty 2

## 2020-01-27 MED ORDER — INSULIN REGULAR(HUMAN) IN NACL 100-0.9 UT/100ML-% IV SOLN
INTRAVENOUS | Status: DC
  Administered 2020-01-27: 21:00:00 8.5 [IU]/h via INTRAVENOUS
  Filled 2020-01-27 (×2): qty 100

## 2020-01-27 MED ORDER — POTASSIUM CHLORIDE 10 MEQ/100ML IV SOLN
10.0000 meq | INTRAVENOUS | Status: AC
  Administered 2020-01-27 (×2): 10 meq via INTRAVENOUS
  Filled 2020-01-27 (×2): qty 100

## 2020-01-27 MED ORDER — SODIUM CHLORIDE 0.9 % IV BOLUS
500.0000 mL | Freq: Once | INTRAVENOUS | Status: AC
  Administered 2020-01-27: 500 mL via INTRAVENOUS

## 2020-01-27 MED ORDER — DEXTROSE 50 % IV SOLN: 0.0000 mL | INTRAVENOUS | Status: DC | PRN

## 2020-01-27 MED ORDER — ONDANSETRON HCL 4 MG/2ML IJ SOLN
4.0000 mg | Freq: Once | INTRAMUSCULAR | Status: AC
  Administered 2020-01-27: 18:00:00 4 mg via INTRAVENOUS
  Filled 2020-01-27: qty 2

## 2020-01-27 NOTE — H&P (Signed)
History and Physical   DREANA ORTLIP [id number] DOB: [birth year] DOA: 01/27/2020  Referring MD/NP/PA: Dr. Archie Balboa  PCP: Lorelee Market, MD   Outpatient Specialists: None  Patient coming from: Home  Chief Complaint: Nausea with vomiting  HPI: Sheryl White is a 44 y.o. female with medical history significant of type 1 diabetes with recurrent DKA's due to poor compliance, anxiety with depression, hyperlipidemia, endometriosis who presents to the ER with nausea vomiting but no diarrhea.  Patient reported symptoms started today.  Persistent.  Not relieved by any medications at home.  She came to the ER where she was seen and evaluated.  Patient was found to be in another DKA.  She denied missing her insulin dose but has had multiple DKA's in the past.  She has some abdominal pain but no diarrhea.  Patient being admitted to the hospital for treatment of her DKA..  ED Course: Temperature 97.8, blood pressure 156/75, pulse 133, respiratory of 34 and oxygen sat 99% room air.  Sodium 139 potassium 4.4 chloride 106 CO2 of less than 7 glucose 429.  Creatinine 1.29.  Anion gap of 23.  White count is 25.1 hemoglobin 16.0 and platelets of 406.  ABG showed a pH of 7.19.  Beta hydroxybutyrate is elevated more than 8.  Patient is therefore being admitted with DKA  Review of Systems: As per HPI otherwise 10 point review of systems negative.    Past Medical History:  Diagnosis Date  . Angina   . Anxiety   . Depression   . Diabetes mellitus   . Dyslipidemia   . Endometriosis   . GERD (gastroesophageal reflux disease)   . Ovarian cyst     Past Surgical History:  Procedure Laterality Date  . ABDOMINAL HYSTERECTOMY     still have left ovary  . CARPAL TUNNEL RELEASE     bilateral  . CESAREAN SECTION     x 1  . COLONOSCOPY    . ENDOMETRIAL ABLATION    . LAPAROSCOPIC SALPINGOOPHERECTOMY    . svd     x 1  . TRIGGER FINGER RELEASE     bilateral  . TUBAL LIGATION    . UPPER  GASTROINTESTINAL ENDOSCOPY    . WISDOM TOOTH EXTRACTION       reports that she has been smoking cigarettes. She has a 5.00 pack-year smoking history. She has never used smokeless tobacco. She reports current alcohol use of about 2.0 - 3.0 standard drinks of alcohol per week. She reports that she does not use drugs.  No Known Allergies  Family History  Problem Relation Age of Onset  . Colon polyps Father   . Cancer Father        ?  . Diabetes Son   . Diabetes Sister      Prior to Admission medications   Medication Sig Start Date End Date Taking? Authorizing Provider  ALPRAZolam Duanne Moron) 0.25 MG tablet Take 0.25 mg by mouth daily as needed (panic attacks).     [provider]  insulin aspart (NOVOLOG) 100 UNIT/ML injection Inject 3-12 Units into the skin See admin instructions. Inject 3-12 units per sliding scale subcutaneously 3-4 times daily with meals    [provider]  insulin glargine (LANTUS) 100 UNIT/ML injection Inject 0.22 mLs (22 Units total) into the skin 2 (two) times daily. 04/26/16   Fritzi Mandes, MD  lidocaine (LIDODERM) 5 % Place 1 patch onto the skin daily. Remove & Discard patch within 12 hours or as directed  by MD 09/26/19   Deliah Boston, PA-C  naproxen (NAPROSYN) 375 MG tablet Take 1 tablet (375 mg total) by mouth 2 (two) times daily. 09/26/19   Deliah Boston, PA-C  oxyCODONE-acetaminophen (PERCOCET/ROXICET) 5-325 MG tablet Take by mouth every 4 (four) hours as needed for severe pain.    [provider]    Physical Exam: Vitals:   01/27/20 2200 01/27/20 2202 01/27/20 2230 01/27/20 2300  BP: 126/65 126/65 (!) 122/49 (!) 112/53  Pulse: (!) 133 (!) 133 (!) 132 (!) 133  Resp: (!) 21 (!) 21 20 20   Temp:      TempSrc:      SpO2: 99% 99% 99% 99%  Weight:      Height:          Constitutional: Acutely ill looking, sickly appearing Vitals:   01/27/20 2200 01/27/20 2202 01/27/20 2230 01/27/20 2300  BP: 126/65 126/65 (!) 122/49  (!) 112/53  Pulse: (!) 133 (!) 133 (!) 132 (!) 133  Resp: (!) 21 (!) 21 20 20   Temp:      TempSrc:      SpO2: 99% 99% 99% 99%  Weight:      Height:       Eyes: PERRL, lids and conjunctivae normal ENMT: Mucous membranes are dry. Posterior pharynx clear of any exudate or lesions.Normal dentition.  Neck: normal, supple, no masses, no thyromegaly Respiratory: clear to auscultation bilaterally, no wheezing, no crackles. Normal respiratory effort. No accessory muscle use.  Cardiovascular: Sinus tachycardia, no murmurs / rubs / gallops. No extremity edema. 2+ pedal pulses. No carotid bruits.  Abdomen: no tenderness, no masses palpated. No hepatosplenomegaly. Bowel sounds positive.  Musculoskeletal: no clubbing / cyanosis. No joint deformity upper and lower extremities. Good ROM, no contractures. Normal muscle tone.  Skin: Dry, no rashes, lesions, ulcers. No induration Neurologic: CN 2-12 grossly intact. Sensation intact, DTR normal. Strength 5/5 in all 4.  Psychiatric: Normal judgment and insight. Alert and oriented x 3. Normal mood.     Labs on Admission: I have personally reviewed following labs and imaging studies  CBC: Recent Labs  Lab 01/27/20 1752  WBC 25.1*  HGB 16.0*  HCT 48.4*  MCV 95.3  PLT [id number]*   Basic Metabolic Panel: Recent Labs  Lab 01/27/20 1752 01/27/20 2118  NA 136 139  K 4.8 4.5  CL 101 106  CO2 12* <7*  GLUCOSE 448* 429*  BUN 23* 24*  CREATININE 1.27* 1.29*  CALCIUM 9.7 9.0   GFR: Estimated Creatinine Clearance: 60.1 mL/min (A) (by C-G formula based on SCr of 1.29 mg/dL (H)). Liver Function Tests: No results for input(s): AST, ALT, ALKPHOS, BILITOT, PROT, ALBUMIN in the last 168 hours. No results for input(s): LIPASE, AMYLASE in the last 168 hours. No results for input(s): AMMONIA in the last 168 hours. Coagulation Profile: No results for input(s): INR, PROTIME in the last 168 hours. Cardiac Enzymes: No results for input(s): CKTOTAL, CKMB,  CKMBINDEX, TROPONINI in the last 168 hours. BNP (last 3 results) No results for input(s): PROBNP in the last 8760 hours. HbA1C: No results for input(s): HGBA1C in the last 72 hours. CBG: Recent Labs  Lab 01/27/20 1939 01/27/20 2025 01/27/20 2113 01/27/20 2216 01/27/20 2328  GLUCAP 423* 498* 425* 353* 259*   Lipid Profile: No results for input(s): CHOL, HDL, LDLCALC, TRIG, CHOLHDL, LDLDIRECT in the last 72 hours. Thyroid Function Tests: No results for input(s): TSH, T4TOTAL, FREET4, T3FREE, THYROIDAB in the last 72 hours. Anemia Panel: No results  for input(s): VITAMINB12, FOLATE, FERRITIN, TIBC, IRON, RETICCTPCT in the last 72 hours. Urine analysis:    Component Value Date/Time   COLORURINE YELLOW 09/26/2019 0150   APPEARANCEUR CLEAR 09/26/2019 0150   LABSPEC 1.025 09/26/2019 0150   PHURINE 6.0 09/26/2019 0150   GLUCOSEU >=500 (A) 09/26/2019 0150   HGBUR NEGATIVE 09/26/2019 0150   BILIRUBINUR NEGATIVE 09/26/2019 0150   KETONESUR NEGATIVE 09/26/2019 0150   PROTEINUR NEGATIVE 09/26/2019 0150   UROBILINOGEN 0.2 03/30/2015 1930   NITRITE NEGATIVE 09/26/2019 0150   LEUKOCYTESUR NEGATIVE 09/26/2019 0150   Sepsis Labs: @LABRCNTIP (procalcitonin:4,lacticidven:4) ) Recent Results (from the past 240 hour(s))  Respiratory Panel by RT PCR (Flu A&B, Covid) - Nasopharyngeal Swab     Status: None   Collection Time: 01/27/20  7:41 PM   Specimen: Nasopharyngeal Swab  Result Value Ref Range Status   SARS Coronavirus 2 by RT PCR NEGATIVE NEGATIVE Final    Comment: (NOTE) SARS-CoV-2 target nucleic acids are NOT DETECTED. The SARS-CoV-2 RNA is generally detectable in upper respiratoy specimens during the acute phase of infection. The lowest concentration of SARS-CoV-2 viral copies this assay can detect is 131 copies/mL. A negative result does not preclude SARS-Cov-2 infection and should not be used as the sole basis for treatment or other patient management decisions. A negative result  may occur with  improper specimen collection/handling, submission of specimen other than nasopharyngeal swab, presence of viral mutation(s) within the areas targeted by this assay, and inadequate number of viral copies (<131 copies/mL). A negative result must be combined with clinical observations, patient history, and epidemiological information. The expected result is Negative. Fact Sheet for Patients:  PinkCheek.be Fact Sheet for Healthcare Providers:  GravelBags.it This test is not yet ap proved or cleared by the [place] FDA and  has been authorized for detection and/or diagnosis of SARS-CoV-2 by FDA under an Emergency Use Authorization (EUA). This EUA will remain  in effect (meaning this test can be used) for the duration of the COVID-19 declaration under Section 564(b)(1) of the Act, 21 U.S.C. section 360bbb-3(b)(1), unless the authorization is terminated or revoked sooner.    Influenza A by PCR NEGATIVE NEGATIVE Final   Influenza B by PCR NEGATIVE NEGATIVE Final    Comment: (NOTE) The Xpert Xpress SARS-CoV-2/FLU/RSV assay is intended as an aid in  the diagnosis of influenza from Nasopharyngeal swab specimens and  should not be used as a sole basis for treatment. Nasal washings and  aspirates are unacceptable for Xpert Xpress SARS-CoV-2/FLU/RSV  testing. Fact Sheet for Patients: PinkCheek.be Fact Sheet for Healthcare Providers: GravelBags.it This test is not yet approved or cleared by the [place] FDA and  has been authorized for detection and/or diagnosis of SARS-CoV-2 by  FDA under an Emergency Use Authorization (EUA). This EUA will remain  in effect (meaning this test can be used) for the duration of the  Covid-19 declaration under Section 564(b)(1) of the Act, 21  U.S.C. section 360bbb-3(b)(1), unless the authorization is  terminated or  revoked. Performed at [facility], [address], [place], [place] [address]      Radiological Exams on Admission: No results found.    Assessment/Plan Principal Problem:   DKA (diabetic ketoacidoses) (HCC) Active Problems:   GERD   ARF (acute renal failure) ([place])     #1 diabetic ketoacidosis type I: Patient will be initiated on the DKA protocol.  IV insulin drip with IV fluids and frequent CBG checks and BMP checks.  Once gap is closed transition  to subcutaneous insulin.  Counseling to be provided.  #2 dehydration: Hydrate aggressively with saline and monitor.  #3 GERD: Continue with PPIs as tolerated.  #4 acute kidney injury: Secondary to dehydration.  Follow renal function closely  #5 depression with anxiety: Resume home regimen once stable.   DVT prophylaxis: Lovenox Code Status: Full code Family Communication: No family at bedside Disposition Plan: Home Consults called: None Admission status: Inpatient to stepdown  Severity of Illness: The appropriate patient status for this patient is INPATIENT. Inpatient status is judged to be reasonable and necessary in order to provide the required intensity of service to ensure the patient's safety. The patient's presenting symptoms, physical exam findings, and initial radiographic and laboratory data in the context of their chronic comorbidities is felt to place them at high risk for further clinical deterioration. Furthermore, it is not anticipated that the patient will be medically stable for discharge from the hospital within 2 midnights of admission. The following factors support the patient status of inpatient.   " The patient's presenting symptoms include nausea vomiting abdominal pain. " The worrisome physical exam findings include dry mucous membranes. " The initial radiographic and laboratory data are worrisome because of evidence of DKA. " The chronic co-morbidities include type 1 diabetes.   * I certify  that at the point of admission it is my clinical judgment that the patient will require inpatient hospital care spanning beyond 2 midnights from the point of admission due to high intensity of service, high risk for further deterioration and high frequency of surveillance required.Barbette Merino MD [facility] Pager [phone]  If 7PM-7AM, please contact night-coverage [email or web address] Password [facility]  01/27/2020, 11:59 PM

## 2020-01-27 NOTE — ED Provider Notes (Signed)
[facility] Emergency Department Provider Note    ____________________________________________   I have reviewed the triage vital signs and the nursing notes.   HISTORY  Chief Complaint Diabetic Ketoacidosis   History limited by: Not Limited   HPI Sheryl White is a 44 y.o. female who presents to the emergency department today because of concerns for possible diabetic ketoacidosis.  Patient states that she started becoming sick today.  She does have significant nausea and vomiting.  The patient says that normally she can tell when her diabetic ketoacidosis is coming on although she had no such premonition this time.  The patient cannot remember what her sugars have been in the past couple of days but states when she checked it earlier today they have been elevated.  Patient states she does use an insulin pump and it is still in place. She denies any recent illness or fever.    Records reviewed. Per medical record review patient has a history of depression, DM, DKA.   Past Medical History:  Diagnosis Date  . Angina   . Anxiety   . Depression   . Diabetes mellitus   . Dyslipidemia   . Endometriosis   . GERD (gastroesophageal reflux disease)   . Ovarian cyst     Patient Active Problem List   Diagnosis Date Noted  . ARF (acute renal failure) ([place]) 01/31/2016  . Nausea & vomiting 01/31/2016  . DKA, type 1 ([place]) 01/31/2016  . Emesis   . DKA (diabetic ketoacidoses) ([place]) 03/31/2015  . UTI (lower urinary tract infection) 03/30/2015  . Diabetic ketoacidosis, type I ([place]) 10/07/2013    Class: Acute  . Abdominal pain, right lower quadrant 01/09/2012  . CHEST PAIN UNSPECIFIED 11/18/2009  . ABDOMINAL PAIN, EPIGASTRIC 09/12/2009  . DM (diabetes mellitus), type 1 ([place]) 06/01/2008  . DM 05/31/2008  . DYSLIPIDEMIA 05/31/2008  . DEPRESSION 05/31/2008  . GERD 05/31/2008  . RECTAL BLEEDING 05/31/2008  . BLOOD IN STOOL 05/31/2008  . DIARRHEA 05/31/2008     Past Surgical History:  Procedure Laterality Date  . ABDOMINAL HYSTERECTOMY     still have left ovary  . CARPAL TUNNEL RELEASE     bilateral  . CESAREAN SECTION     x 1  . COLONOSCOPY    . ENDOMETRIAL ABLATION    . LAPAROSCOPIC SALPINGOOPHERECTOMY    . svd     x 1  . TRIGGER FINGER RELEASE     bilateral  . TUBAL LIGATION    . UPPER GASTROINTESTINAL ENDOSCOPY    . WISDOM TOOTH EXTRACTION      Prior to Admission medications   Medication Sig Start Date End Date Taking? Authorizing Provider  ALPRAZolam Duanne Moron) 0.25 MG tablet Take 0.25 mg by mouth daily as needed (panic attacks).     [provider]  insulin aspart (NOVOLOG) 100 UNIT/ML injection Inject 3-12 Units into the skin See admin instructions. Inject 3-12 units per sliding scale subcutaneously 3-4 times daily with meals    [provider]  insulin glargine (LANTUS) 100 UNIT/ML injection Inject 0.22 mLs (22 Units total) into the skin 2 (two) times daily. 04/26/16   Fritzi Mandes, MD  lidocaine (LIDODERM) 5 % Place 1 patch onto the skin daily. Remove & Discard patch within 12 hours or as directed by MD 09/26/19   Nuala Alpha A, PA-C  naproxen (NAPROSYN) 375 MG tablet Take 1 tablet (375 mg total) by mouth 2 (two) times daily. 09/26/19   Deliah Boston, PA-C  oxyCODONE-acetaminophen (PERCOCET/ROXICET) 5-325 MG tablet Take by mouth every 4 (four) hours as needed for severe pain.    [provider]    Allergies Patient has no known allergies.  Family History  Problem Relation Age of Onset  . Colon polyps Father   . Cancer Father        ?  . Diabetes Son   . Diabetes Sister     Social History Social History   Tobacco Use  . Smoking status: Current Every Day Smoker    Packs/day: 1.00    Years: 5.00    Pack years: 5.00    Types: Cigarettes  . Smokeless tobacco: Never Used  Substance Use Topics  . Alcohol use: Yes    Alcohol/week: 2.0 - 3.0 standard drinks    Types: 2 - 3 Standard  drinks or equivalent per week    Comment: occasionally  . Drug use: No    Review of Systems Constitutional: No fever/chills Eyes: No visual changes. ENT: No sore throat. Cardiovascular: Denies chest pain. Respiratory: Denies shortness of breath. Gastrointestinal: Positive for nausea and vomiting.   Genitourinary: Negative for dysuria. Musculoskeletal: Negative for back pain. Skin: Negative for rash. Neurological: Negative for headaches, focal weakness or numbness.  ____________________________________________   PHYSICAL EXAM:  VITAL SIGNS: ED Triage Vitals  Enc Vitals Group     BP 01/27/20 1738 140/71     Pulse Rate 01/27/20 1738 (!) 125     Resp 01/27/20 1738 20     Temp 01/27/20 1738 97.8 F (36.6 C)     Temp Source 01/27/20 1738 Oral     SpO2 01/27/20 1738 100 %     Weight 01/27/20 1737 185 lb (83.9 kg)     Height 01/27/20 1737 5\' 6"  (1.676 m)     Head Circumference --      Peak Flow --      Pain Score 01/27/20 1735 0   Constitutional: Alert and oriented.  Eyes: Conjunctivae are normal.  ENT      Head: Normocephalic and atraumatic.      Nose: No congestion/rhinnorhea.      Mouth/Throat: Mucous membranes are moist.      Neck: No stridor. Hematological/Lymphatic/Immunilogical: No cervical lymphadenopathy. Cardiovascular: Tachycardic, regular rhythm.  No murmurs, rubs, or gallops.  Respiratory: Normal respiratory effort without tachypnea nor retractions. Breath sounds are clear and equal bilaterally. No wheezes/rales/rhonchi. Gastrointestinal: Soft and non tender. No rebound. No guarding.  Genitourinary: Deferred Musculoskeletal: Normal range of motion in all extremities. No lower extremity edema. Neurologic:  Normal speech and language. No gross focal neurologic deficits are appreciated.  Skin:  Skin is warm, dry and intact. No rash noted. Psychiatric: Mood and affect are normal. Speech and behavior are normal. Patient exhibits appropriate insight and  judgment.  ____________________________________________    LABS (pertinent positives/negatives)  CBC wbc 25.1, hgb 16.0, plt 406 PH 7.19 BMP na 136, k 4.8, glu 448, cr 1.27, anion gap 23 ____________________________________________   EKG  I, Nance Pear, attending physician, personally viewed and interpreted this EKG  EKG Time: 1818 Rate: 123 Rhythm: sinus tachycardia Axis: normal Intervals: qtc 437 QRS: narrow, q waves v1, v2 ST changes: no st elevation Impression: abnormal ekg  ____________________________________________    RADIOLOGY  None  ____________________________________________   PROCEDURES  Procedures  CRITICAL CARE Performed by: Nance Pear   Total critical care time: 30 minutes  Critical care time was exclusive of separately billable procedures and treating other patients.  Critical care was necessary  to treat or prevent imminent or life-threatening deterioration.  Critical care was time spent personally by me on the following activities: development of treatment plan with patient and/or surrogate as well as nursing, discussions with consultants, evaluation of patient's response to treatment, examination of patient, obtaining history from patient or surrogate, ordering and performing treatments and interventions, ordering and review of laboratory studies, ordering and review of radiographic studies, pulse oximetry and re-evaluation of patient's condition.  ____________________________________________   INITIAL IMPRESSION / ASSESSMENT AND PLAN / ED COURSE  Pertinent labs & imaging results that were available during my care of the patient were reviewed by me and considered in my medical decision making (see chart for details).   Patient presented to the emergency department today because of concerns for possible diabetic ketoacidosis.  Patient main symptoms have been nausea and vomiting.  Patient has a history of diabetic ketoacidosis.   Blood work here does show acidosis as well as elevated anion gap and hyperglycemia.  I do think patient likely is suffering from diabetic ketoacidosis.  I discussed this with the patient.  Will plan on starting insulin drip.  Will give IV fluids.   ____________________________________________   FINAL CLINICAL IMPRESSION(S) / ED DIAGNOSES  Final diagnoses:  Type 1 diabetes mellitus with ketoacidosis without coma ([place])     Note: This dictation was prepared with Dragon dictation. Any transcriptional errors that result from this process are unintentional     Nance Pear, MD 01/27/20 1902

## 2020-01-27 NOTE — ED Notes (Signed)
This EDT and Tom RN assisted pt to restroom

## 2020-01-27 NOTE — ED Notes (Signed)
Pt woke and vomited, Dr Archie Balboa verbal for zofran

## 2020-01-27 NOTE — ED Triage Notes (Signed)
Patient is lethargic upon arrival. Reports she is in DKA and has been having episodes of emesis.

## 2020-01-27 NOTE — ED Notes (Signed)
Pt lying in bed with eyes closed in no obvious acute distress. No sx of N/V exhibited. Will continue to monitor

## 2020-01-28 LAB — GLUCOSE, CAPILLARY
Glucose-Capillary: 160 mg/dL — ABNORMAL HIGH (ref 70–99)
Glucose-Capillary: 161 mg/dL — ABNORMAL HIGH (ref 70–99)
Glucose-Capillary: 174 mg/dL — ABNORMAL HIGH (ref 70–99)
Glucose-Capillary: 182 mg/dL — ABNORMAL HIGH (ref 70–99)
Glucose-Capillary: 185 mg/dL — ABNORMAL HIGH (ref 70–99)
Glucose-Capillary: 188 mg/dL — ABNORMAL HIGH (ref 70–99)
Glucose-Capillary: 189 mg/dL — ABNORMAL HIGH (ref 70–99)
Glucose-Capillary: 201 mg/dL — ABNORMAL HIGH (ref 70–99)
Glucose-Capillary: 231 mg/dL — ABNORMAL HIGH (ref 70–99)
Glucose-Capillary: 232 mg/dL — ABNORMAL HIGH (ref 70–99)
Glucose-Capillary: 237 mg/dL — ABNORMAL HIGH (ref 70–99)
Glucose-Capillary: 242 mg/dL — ABNORMAL HIGH (ref 70–99)
Glucose-Capillary: 257 mg/dL — ABNORMAL HIGH (ref 70–99)
Glucose-Capillary: 266 mg/dL — ABNORMAL HIGH (ref 70–99)

## 2020-01-28 LAB — BASIC METABOLIC PANEL
Anion gap: 16 — ABNORMAL HIGH (ref 5–15)
Anion gap: 14 (ref 5–15)
Anion gap: 12 (ref 5–15)
Anion gap: 13 (ref 5–15)
Anion gap: 12 (ref 5–15)
BUN: 24 mg/dL — ABNORMAL HIGH (ref 6–20)
BUN: 24 mg/dL — ABNORMAL HIGH (ref 6–20)
BUN: 23 mg/dL — ABNORMAL HIGH (ref 6–20)
BUN: 18 mg/dL (ref 6–20)
BUN: 16 mg/dL (ref 6–20)
CO2: 13 mmol/L — ABNORMAL LOW (ref 22–32)
CO2: 18 mmol/L — ABNORMAL LOW (ref 22–32)
CO2: 20 mmol/L — ABNORMAL LOW (ref 22–32)
CO2: 18 mmol/L — ABNORMAL LOW (ref 22–32)
CO2: 18 mmol/L — ABNORMAL LOW (ref 22–32)
Calcium: 8.9 mg/dL (ref 8.9–10.3)
Calcium: 9.1 mg/dL (ref 8.9–10.3)
Calcium: 8.8 mg/dL — ABNORMAL LOW (ref 8.9–10.3)
Calcium: 8.8 mg/dL — ABNORMAL LOW (ref 8.9–10.3)
Calcium: 8.3 mg/dL — ABNORMAL LOW (ref 8.9–10.3)
Chloride: 112 mmol/L — ABNORMAL HIGH (ref 98–111)
Chloride: 110 mmol/L (ref 98–111)
Chloride: 109 mmol/L (ref 98–111)
Chloride: 110 mmol/L (ref 98–111)
Chloride: 110 mmol/L (ref 98–111)
Creatinine, Ser: 1.1 mg/dL — ABNORMAL HIGH (ref 0.44–1.00)
Creatinine, Ser: 0.98 mg/dL (ref 0.44–1.00)
Creatinine, Ser: 1 mg/dL (ref 0.44–1.00)
Creatinine, Ser: 0.83 mg/dL (ref 0.44–1.00)
Creatinine, Ser: 0.87 mg/dL (ref 0.44–1.00)
GFR calc Af Amer: >60 mL/min (ref >60)
GFR calc Af Amer: >60 mL/min (ref >60)
GFR calc Af Amer: >60 mL/min (ref >60)
GFR calc Af Amer: >60 mL/min (ref >60)
GFR calc Af Amer: >60 mL/min (ref >60)
GFR calc non Af Amer: >60 mL/min (ref >60)
GFR calc non Af Amer: >60 mL/min (ref >60)
GFR calc non Af Amer: >60 mL/min (ref >60)
GFR calc non Af Amer: >60 mL/min (ref >60)
GFR calc non Af Amer: >60 mL/min (ref >60)
Glucose, Bld: 230 mg/dL — ABNORMAL HIGH (ref 70–99)
Glucose, Bld: 178 mg/dL — ABNORMAL HIGH (ref 70–99)
Glucose, Bld: 264 mg/dL — ABNORMAL HIGH (ref 70–99)
Glucose, Bld: 230 mg/dL — ABNORMAL HIGH (ref 70–99)
Glucose, Bld: 201 mg/dL — ABNORMAL HIGH (ref 70–99)
Potassium: 4.3 mmol/L (ref 3.5–5.1)
Potassium: 4.1 mmol/L (ref 3.5–5.1)
Potassium: 3.9 mmol/L (ref 3.5–5.1)
Potassium: 3.9 mmol/L (ref 3.5–5.1)
Potassium: 3.7 mmol/L (ref 3.5–5.1)
Sodium: 141 mmol/L (ref 135–145)
Sodium: 142 mmol/L (ref 135–145)
Sodium: 141 mmol/L (ref 135–145)
Sodium: 141 mmol/L (ref 135–145)
Sodium: 140 mmol/L (ref 135–145)

## 2020-01-28 LAB — URINALYSIS, COMPLETE (UACMP) WITH MICROSCOPIC
Bacteria, UA: NONE SEEN
Bilirubin Urine: NEGATIVE
Glucose, UA: >=500 mg/dL — AB
Hgb urine dipstick: NEGATIVE
Ketones, ur: 80 mg/dL — AB
Leukocytes,Ua: NEGATIVE
Nitrite: NEGATIVE
Protein, ur: 30 mg/dL — AB
Specific Gravity, Urine: 1.021 (ref 1.005–1.030)
pH: 5 (ref 5.0–8.0)

## 2020-01-28 LAB — BETA-HYDROXYBUTYRIC ACID
Beta-Hydroxybutyric Acid: 6.23 mmol/L — ABNORMAL HIGH (ref 0.05–0.27)
Beta-Hydroxybutyric Acid: 2.13 mmol/L — ABNORMAL HIGH (ref 0.05–0.27)

## 2020-01-28 LAB — PREGNANCY, URINE: Preg Test, Ur: NEGATIVE

## 2020-01-28 LAB — MAGNESIUM: Magnesium: 1.9 mg/dL (ref 1.7–2.4)

## 2020-01-28 LAB — HIV ANTIBODY (ROUTINE TESTING W REFLEX): HIV Screen 4th Generation wRfx: NONREACTIVE

## 2020-01-28 LAB — HEMOGLOBIN A1C
Hgb A1c MFr Bld: 10.3 % — ABNORMAL HIGH (ref 4.8–5.6)
Mean Plasma Glucose: 248.91 mg/dL

## 2020-01-28 MED ORDER — POTASSIUM CHLORIDE 10 MEQ/100ML IV SOLN
10.0000 meq | INTRAVENOUS | Status: AC
  Filled 2020-01-28 (×2): qty 100

## 2020-01-28 MED ORDER — ONDANSETRON HCL 4 MG/2ML IJ SOLN
4.0000 mg | Freq: Four times a day (QID) | INTRAMUSCULAR | Status: DC | PRN
  Administered 2020-01-28: 14:00:00 4 mg via INTRAVENOUS
  Filled 2020-01-28: qty 2

## 2020-01-28 MED ORDER — INSULIN ASPART 100 UNIT/ML ~~LOC~~ SOLN
0.0000 [IU] | SUBCUTANEOUS | Status: DC
  Administered 2020-01-28: 22:00:00 4 [IU] via SUBCUTANEOUS
  Administered 2020-01-29: 12:00:00 3 [IU] via SUBCUTANEOUS
  Administered 2020-01-29: 05:00:00 4 [IU] via SUBCUTANEOUS
  Administered 2020-01-29 (×2): 7 [IU] via SUBCUTANEOUS
  Administered 2020-01-29: 01:00:00 4 [IU] via SUBCUTANEOUS
  Administered 2020-01-29: 20:00:00 3 [IU] via SUBCUTANEOUS
  Administered 2020-01-30: 09:00:00 4 [IU] via SUBCUTANEOUS
  Filled 2020-01-28 (×9): qty 1

## 2020-01-28 MED ORDER — DEXTROSE-NACL 5-0.45 % IV SOLN: INTRAVENOUS | Status: DC

## 2020-01-28 MED ORDER — INSULIN ASPART 100 UNIT/ML ~~LOC~~ SOLN
0.0000 [IU] | Freq: Three times a day (TID) | SUBCUTANEOUS | Status: DC
  Administered 2020-01-28: 17:00:00 5 [IU] via SUBCUTANEOUS
  Filled 2020-01-28: qty 1

## 2020-01-28 MED ORDER — PANTOPRAZOLE SODIUM 40 MG PO TBEC
40.0000 mg | DELAYED_RELEASE_TABLET | Freq: Every day | ORAL | Status: DC
  Administered 2020-01-28 – 2020-01-30 (×3): 40 mg via ORAL
  Filled 2020-01-28 (×3): qty 1

## 2020-01-28 MED ORDER — ENOXAPARIN SODIUM 40 MG/0.4ML ~~LOC~~ SOLN: 40.0000 mg | SUBCUTANEOUS | Status: DC

## 2020-01-28 MED ORDER — INSULIN REGULAR(HUMAN) IN NACL 100-0.9 UT/100ML-% IV SOLN
INTRAVENOUS | Status: DC
  Administered 2020-01-28: 7 [IU]/h via INTRAVENOUS
  Filled 2020-01-28: qty 100

## 2020-01-28 MED ORDER — INSULIN ASPART 100 UNIT/ML ~~LOC~~ SOLN: 0.0000 [IU] | Freq: Every day | SUBCUTANEOUS | Status: DC

## 2020-01-28 MED ORDER — ONDANSETRON HCL 4 MG/2ML IJ SOLN: 4.0000 mg | Freq: Once | INTRAMUSCULAR | Status: AC

## 2020-01-28 MED ORDER — INSULIN ASPART 100 UNIT/ML ~~LOC~~ SOLN: 0.0000 [IU] | SUBCUTANEOUS | Status: DC

## 2020-01-28 MED ORDER — INSULIN DETEMIR 100 UNIT/ML ~~LOC~~ SOLN
30.0000 [IU] | SUBCUTANEOUS | Status: DC
  Administered 2020-01-29: 16:00:00 30 [IU] via SUBCUTANEOUS
  Filled 2020-01-28 (×2): qty 0.3

## 2020-01-28 MED ORDER — PROMETHAZINE HCL 25 MG/ML IJ SOLN
25.0000 mg | Freq: Four times a day (QID) | INTRAMUSCULAR | Status: DC | PRN
  Administered 2020-01-28: 25 mg via INTRAVENOUS
  Filled 2020-01-28: qty 1

## 2020-01-28 MED ORDER — INSULIN DETEMIR 100 UNIT/ML ~~LOC~~ SOLN
0.3000 [IU]/kg | SUBCUTANEOUS | Status: DC
  Administered 2020-01-28: 25 [IU] via SUBCUTANEOUS
  Filled 2020-01-28 (×3): qty 0.25

## 2020-01-28 MED ORDER — SODIUM CHLORIDE 0.9 % IV SOLN: INTRAVENOUS | Status: DC

## 2020-01-28 MED ORDER — INSULIN DETEMIR 100 UNIT/ML ~~LOC~~ SOLN
5.0000 [IU] | Freq: Once | SUBCUTANEOUS | Status: AC
  Administered 2020-01-28: 17:00:00 5 [IU] via SUBCUTANEOUS
  Filled 2020-01-28 (×2): qty 0.05

## 2020-01-28 MED ORDER — ONDANSETRON HCL 4 MG/2ML IJ SOLN
INTRAMUSCULAR | Status: AC
  Administered 2020-01-28: 04:00:00 4 mg via INTRAVENOUS
  Filled 2020-01-28: qty 2

## 2020-01-28 MED ORDER — FENTANYL CITRATE (PF) 100 MCG/2ML IJ SOLN
50.0000 ug | Freq: Once | INTRAMUSCULAR | Status: AC
  Administered 2020-01-28: 07:00:00 50 ug via INTRAVENOUS
  Filled 2020-01-28: qty 2

## 2020-01-28 MED ORDER — ENOXAPARIN SODIUM 40 MG/0.4ML ~~LOC~~ SOLN
40.0000 mg | SUBCUTANEOUS | Status: DC
  Administered 2020-01-28 – 2020-01-29 (×2): 40 mg via SUBCUTANEOUS
  Filled 2020-01-28 (×2): qty 0.4

## 2020-01-28 MED ORDER — IBUPROFEN 400 MG PO TABS: 800.0000 mg | ORAL_TABLET | Freq: Three times a day (TID) | ORAL | Status: DC | PRN

## 2020-01-28 MED ORDER — POTASSIUM CHLORIDE 10 MEQ/100ML IV SOLN
10.0000 meq | INTRAVENOUS | Status: AC
  Administered 2020-01-28 (×2): 10 meq via INTRAVENOUS
  Filled 2020-01-28 (×2): qty 100

## 2020-01-28 MED ORDER — INSULIN ASPART 100 UNIT/ML ~~LOC~~ SOLN
4.0000 [IU] | Freq: Three times a day (TID) | SUBCUTANEOUS | Status: DC
  Administered 2020-01-28 – 2020-01-30 (×5): 4 [IU] via SUBCUTANEOUS
  Filled 2020-01-28 (×5): qty 1

## 2020-01-28 MED ORDER — DEXTROSE 50 % IV SOLN: 0.0000 mL | INTRAVENOUS | Status: DC | PRN

## 2020-01-28 NOTE — ED Notes (Addendum)
Pt had episode of emesis. Meds given (see emar). Blankets replaced, New gown applied, new sick bag provided. Pt assisted to toilet and provided ice water per her request.

## 2020-01-28 NOTE — Progress Notes (Signed)
   Vital Signs MEWS/VS Documentation      01/28/2020 1500 01/28/2020 1512 01/28/2020 1530 01/28/2020 1557   MEWS Score:  2  1  2  2    MEWS Score Color:  Yellow  Green  Yellow  Yellow   Resp:  14  20  20  20    Pulse:  (!) 114  (!) 103  (!) 110  (!) 115   BP:  (!) 174/96  --  --  (!) 153/70   Temp:  --  --  --  98.7 F (37.1 C)   O2 Device:  --  --  --  Room Air   Level of Consciousness:  --  --  --  Alert       This is not an acute change. Dr. Priscella Mann aware.     Levada Schilling 01/28/2020,4:20 PM

## 2020-01-28 NOTE — ED Notes (Signed)
Report given to [place], RN

## 2020-01-28 NOTE — ED Notes (Signed)
Pt ambulatory with standby assistance to the bathroom 

## 2020-01-28 NOTE — Significant Event (Signed)
Received call from bedside nurse after patient received on MedSurg floor.  For reasons that are unclear the insulin drip for her DKA was stopped well prior to administration of subcutaneous Lantus.  Glucose has improved however patient still with significant vomiting.  We will continue fluids and attempt to control DKA with subcutaneous insulin regimen.  If anion gap opens back up patient may need transfer to stepdown unit and reinitiation of insulin GTT.  Ralene Muskrat MD

## 2020-01-28 NOTE — ED Notes (Signed)
Transportation requested  

## 2020-01-28 NOTE — Progress Notes (Addendum)
Inpatient Diabetes Program Recommendations  AACE/ADA: New Consensus Statement on Inpatient Glycemic Control (2015)  Target Ranges:  Prepandial:   less than 140 mg/dL      Peak postprandial:   less than 180 mg/dL (1-2 hours)      Critically ill patients:  140 - 180 mg/dL   Results for Sheryl White, Sheryl White (MRN [id number]) as of 01/28/2020 10:23  Ref. Range 01/27/2020 17:52  Sodium Latest Ref Range: 135 - 145 mmol/L 136  Potassium Latest Ref Range: 3.5 - 5.1 mmol/L 4.8  Chloride Latest Ref Range: 98 - 111 mmol/L 101  CO2 Latest Ref Range: 22 - 32 mmol/L 12 (L)  Glucose Latest Ref Range: 70 - 99 mg/dL 448 (H)  BUN Latest Ref Range: 6 - 20 mg/dL 23 (H)  Creatinine Latest Ref Range: 0.44 - 1.00 mg/dL 1.27 (H)  Calcium Latest Ref Range: 8.9 - 10.3 mg/dL 9.7  Anion gap Latest Ref Range: 5 - 15  23 (H)     Admit with: DKA  History: Type 1 Diabetes (diagnosed at age 20), Recurrent DKA  Home DM Meds: Insulin Pump  Current Orders: IV Insulin Drip    7:53am BMET shows the following: Glucose 178 mg/dl Anion Gap 14 CO2 level 18   MD- would wait to transition pt to SQ Insulin until her CO2 is closer to 20 and Anion gap closer to 12 or less.  When pt ready to transition to SQ Insulin, can choose to either have pt restart home insulin pump (if she has all new pump supplies or can get family to bring more supplies to hospital), or, if she can't get pump supplies from home, we can transition her to Lantus and Novolog.  Have secure chatted w/ RN this AM and she is checking to see if pt has extra pump supplies with her.  Option #1 If pt can get fresh pump supplies, when ready to transition, have pt resume insulin pump and then can d/c the IV Insulin drip one hour after home insulin pump restarted.  Option #2 If pt cannot get fresh insulin pump supplies, then would transition to Lantus and Novolog: Lantus 30 units Daily Novolog Sensitive Correction Scale/ SSI (0-9 units) TID AC + HS Novolog 4  units TID with meals for meal coverage (these recs are based on pump settings from last ENDO visitt--see below)   Endocrinologist: Dr. Honor Junes with [facility]--last seen 03/01/2019 At that visit, insulin pump settings were as follows: Basal Rates= 1.5 units/hr Total Basal per 24 hour period= 36 units Correction Ratio= 1 unit for every 50 mg/dl above goal CBG of 125 mg/dl Carbohydrate Ratio= 1 unit for every 15 grams carbohydrates     --Will follow patient during hospitalization--  Wyn Quaker RN, MSN, CDE Diabetes Coordinator Inpatient Glycemic Control Team Team Pager: [phone] (8a-5p)

## 2020-01-28 NOTE — Progress Notes (Signed)
Patient arrived to floor with no insulin gtt and Levemir not administered. Per order and Dr. Priscella Mann, patient was to have Levemir 2 hours prior to discontinuing insulin gtt. Dr. Priscella Mann to bedside and requested hold off on insulin gtt and give 30 units Levemir and obtain q2h CBG. Writer will monitor.

## 2020-01-28 NOTE — Progress Notes (Signed)
PROGRESS NOTE    Sheryl White  [id number] DOB: 06/10/1976 DOA: 01/27/2020 PCP: Lorelee Market, MD   Brief Narrative:  44 year old female history of type 1 diabetes on insulin pump presented with chief complaint of intractable nausea and vomiting x1 day.  Found to be in DKA.  Started on insulin GTT per Endo tool protocol  2/14: Patient seen and examined in the emergency department.  Initially was on insulin GTT.  Was able to titrate insulin drip off and transition to subcutaneous basal/bolus regimen.  Nausea and emesis improved.  Patient remains mildly tachycardic but improving.   Assessment & Plan:   Principal Problem:   DKA (diabetic ketoacidoses) ([place]) Active Problems:   GERD   ARF (acute renal failure) (HCC)  Diabetic ketoacidosis Diabetes type 1, insulin-dependent, poorly controlled Patient has insulin pump but has had recurrent admissions for DKA Now presents with intractable nausea and vomiting secondary to above Was on insulin drip, now titrated off Plan: Transfer to MedSurg Subcutaneous insulin regimen as follows: Lantus 30 units daily NovoLog 4 units 3 times daily with meals Sliding scale, sensitive 0-5 units nightly coverage Advance diet as tolerated to carb modified Diabetes coordinator consult  Intractable nausea and vomiting Improved, secondary to DKA As needed Zofran IV fluids   DVT prophylaxis: Lovenox Code Status: Full Family Communication: None Disposition Plan: Home, anticipate 24 to 48 hours  Consultants:   None  Procedures:   None  Antimicrobials:   None   Subjective: Seen and examined Nausea improved Tachycardic  Objective: Vitals:   01/28/20 1400 01/28/20 1404 01/28/20 1500 01/28/20 1512  BP: (!) 161/84  (!) 174/96   Pulse: (!) 107  (!) 114 (!) 103  Resp: 18  14 20   Temp:  98.6 F (37 C)    TempSrc:  Oral    SpO2: 99%  100% 100%  Weight:      Height:        Intake/Output Summary (Last 24 hours) at  01/28/2020 1515 Last data filed at 01/28/2020 1105 Gross per 24 hour  Intake 1501.52 ml  Output --  Net 1501.52 ml   Filed Weights   01/27/20 1737  Weight: 83.9 kg    Examination:  General exam: Mild distress due to nausea Respiratory system: Slightly tachypneic, normal work of breathing Cardiovascular system: Tachycardic, no murmurs, no pedal edema  gastrointestinal system: Nontender, nondistended, normal bowel sounds Central nervous system: Alert and oriented. No focal neurological deficits. Extremities: Symmetric 5 x 5 power. Skin: No rashes, lesions or ulcers Psychiatry: Judgement and insight appear normal. Mood & affect appropriate.     Data Reviewed: I have personally reviewed following labs and imaging studies  CBC: Recent Labs  Lab 01/27/20 1752  WBC 25.1*  HGB 16.0*  HCT 48.4*  MCV 95.3  PLT [id number]*   Basic Metabolic Panel: Recent Labs  Lab 01/27/20 1752 01/27/20 2118 01/28/20 0301 01/28/20 0753 01/28/20 1231  NA 136 139 141 142 141  K 4.8 4.5 4.3 4.1 3.9  CL 101 106 112* 110 109  CO2 12* <7* 13* 18* 20*  GLUCOSE 448* 429* 230* 178* 264*  BUN 23* 24* 24* 24* 23*  CREATININE 1.27* 1.29* 1.10* 0.98 1.00  CALCIUM 9.7 9.0 8.9 9.1 8.8*   GFR: Estimated Creatinine Clearance: 77.5 mL/min (by C-G formula based on SCr of 1 mg/dL). Liver Function Tests: No results for input(s): AST, ALT, ALKPHOS, BILITOT, PROT, ALBUMIN in the last 168 hours. No results for input(s): LIPASE, AMYLASE in the last 168  hours. No results for input(s): AMMONIA in the last 168 hours. Coagulation Profile: No results for input(s): INR, PROTIME in the last 168 hours. Cardiac Enzymes: No results for input(s): CKTOTAL, CKMB, CKMBINDEX, TROPONINI in the last 168 hours. BNP (last 3 results) No results for input(s): PROBNP in the last 8760 hours. HbA1C: Recent Labs    01/28/20 0301  HGBA1C 10.3*   CBG: Recent Labs  Lab 01/28/20 0751 01/28/20 0845 01/28/20 0959 01/28/20 1151  01/28/20 1452  GLUCAP 174* 160* 189* 237* 232*   Lipid Profile: No results for input(s): CHOL, HDL, LDLCALC, TRIG, CHOLHDL, LDLDIRECT in the last 72 hours. Thyroid Function Tests: No results for input(s): TSH, T4TOTAL, FREET4, T3FREE, THYROIDAB in the last 72 hours. Anemia Panel: No results for input(s): VITAMINB12, FOLATE, FERRITIN, TIBC, IRON, RETICCTPCT in the last 72 hours. Sepsis Labs: No results for input(s): PROCALCITON, LATICACIDVEN in the last 168 hours.  Recent Results (from the past 240 hour(s))  Respiratory Panel by RT PCR (Flu A&B, Covid) - Nasopharyngeal Swab     Status: None   Collection Time: 01/27/20  7:41 PM   Specimen: Nasopharyngeal Swab  Result Value Ref Range Status   SARS Coronavirus 2 by RT PCR NEGATIVE NEGATIVE Final    Comment: (NOTE) SARS-CoV-2 target nucleic acids are NOT DETECTED. The SARS-CoV-2 RNA is generally detectable in upper respiratoy specimens during the acute phase of infection. The lowest concentration of SARS-CoV-2 viral copies this assay can detect is 131 copies/mL. A negative result does not preclude SARS-Cov-2 infection and should not be used as the sole basis for treatment or other patient management decisions. A negative result may occur with  improper specimen collection/handling, submission of specimen other than nasopharyngeal swab, presence of viral mutation(s) within the areas targeted by this assay, and inadequate number of viral copies (<131 copies/mL). A negative result must be combined with clinical observations, patient history, and epidemiological information. The expected result is Negative. Fact Sheet for Patients:  PinkCheek.be Fact Sheet for Healthcare Providers:  GravelBags.it This test is not yet ap proved or cleared by the [place] FDA and  has been authorized for detection and/or diagnosis of SARS-CoV-2 by FDA under an Emergency Use Authorization (EUA).  This EUA will remain  in effect (meaning this test can be used) for the duration of the COVID-19 declaration under Section 564(b)(1) of the Act, 21 U.S.C. section 360bbb-3(b)(1), unless the authorization is terminated or revoked sooner.    Influenza A by PCR NEGATIVE NEGATIVE Final   Influenza B by PCR NEGATIVE NEGATIVE Final    Comment: (NOTE) The Xpert Xpress SARS-CoV-2/FLU/RSV assay is intended as an aid in  the diagnosis of influenza from Nasopharyngeal swab specimens and  should not be used as a sole basis for treatment. Nasal washings and  aspirates are unacceptable for Xpert Xpress SARS-CoV-2/FLU/RSV  testing. Fact Sheet for Patients: PinkCheek.be Fact Sheet for Healthcare Providers: GravelBags.it This test is not yet approved or cleared by the [place] FDA and  has been authorized for detection and/or diagnosis of SARS-CoV-2 by  FDA under an Emergency Use Authorization (EUA). This EUA will remain  in effect (meaning this test can be used) for the duration of the  Covid-19 declaration under Section 564(b)(1) of the Act, 21  U.S.C. section 360bbb-3(b)(1), unless the authorization is  terminated or revoked. Performed at [facility] [address], [place], [place] [address]          Radiology Studies: No results found.  Scheduled Meds: . enoxaparin (LOVENOX) injection  40 mg Subcutaneous Q24H  . insulin aspart  0-5 Units Subcutaneous QHS  . insulin aspart  0-9 Units Subcutaneous TID WC  . insulin aspart  4 Units Subcutaneous TID WC  . insulin detemir  0.3 Units/kg Subcutaneous Q24H  . insulin detemir  5 Units Subcutaneous Once  . pantoprazole  40 mg Oral Daily   Continuous Infusions: . sodium chloride    . dextrose 5 % and 0.45% NaCl Stopped (01/28/20 1400)  . insulin Stopped (01/28/20 1502)     LOS: 1 day    Time spent: 35 minutes    Sidney Ace, MD [facility] Pager [phone]  If 7PM-7AM, please contact night-coverage 01/28/2020, 3:15 PM

## 2020-01-29 LAB — BASIC METABOLIC PANEL
Anion gap: 8 (ref 5–15)
BUN: 15 mg/dL (ref 6–20)
CO2: 21 mmol/L — ABNORMAL LOW (ref 22–32)
Calcium: 8.6 mg/dL — ABNORMAL LOW (ref 8.9–10.3)
Chloride: 110 mmol/L (ref 98–111)
Creatinine, Ser: 0.73 mg/dL (ref 0.44–1.00)
GFR calc Af Amer: >60 mL/min (ref >60)
GFR calc non Af Amer: >60 mL/min (ref >60)
Glucose, Bld: 185 mg/dL — ABNORMAL HIGH (ref 70–99)
Potassium: 3.7 mmol/L (ref 3.5–5.1)
Sodium: 139 mmol/L (ref 135–145)

## 2020-01-29 LAB — GLUCOSE, CAPILLARY
Glucose-Capillary: 102 mg/dL — ABNORMAL HIGH (ref 70–99)
Glucose-Capillary: 118 mg/dL — ABNORMAL HIGH (ref 70–99)
Glucose-Capillary: 133 mg/dL — ABNORMAL HIGH (ref 70–99)
Glucose-Capillary: 166 mg/dL — ABNORMAL HIGH (ref 70–99)
Glucose-Capillary: 167 mg/dL — ABNORMAL HIGH (ref 70–99)
Glucose-Capillary: 177 mg/dL — ABNORMAL HIGH (ref 70–99)
Glucose-Capillary: 203 mg/dL — ABNORMAL HIGH (ref 70–99)
Glucose-Capillary: 203 mg/dL — ABNORMAL HIGH (ref 70–99)

## 2020-01-29 MED ORDER — LIDOCAINE VISCOUS HCL 2 % MT SOLN
15.0000 mL | Freq: Once | OROMUCOSAL | Status: AC
  Administered 2020-01-29: 15 mL via ORAL
  Filled 2020-01-29: qty 15

## 2020-01-29 MED ORDER — FLUCONAZOLE 50 MG PO TABS
150.0000 mg | ORAL_TABLET | Freq: Once | ORAL | Status: AC
  Administered 2020-01-29: 150 mg via ORAL
  Filled 2020-01-29: qty 1

## 2020-01-29 MED ORDER — MENTHOL 3 MG MT LOZG
1.0000 | LOZENGE | OROMUCOSAL | Status: DC | PRN
  Administered 2020-01-29 (×2): 3 mg via ORAL
  Filled 2020-01-29 (×2): qty 9

## 2020-01-29 MED ORDER — NYSTATIN 100000 UNIT/GM EX CREA
1.0000 "application " | TOPICAL_CREAM | Freq: Two times a day (BID) | CUTANEOUS | Status: DC
  Administered 2020-01-29 – 2020-01-30 (×2): 1 via TOPICAL
  Filled 2020-01-29: qty 15

## 2020-01-29 MED ORDER — ALUM & MAG HYDROXIDE-SIMETH 200-200-20 MG/5ML PO SUSP
30.0000 mL | Freq: Once | ORAL | Status: AC
  Administered 2020-01-29: 30 mL via ORAL
  Filled 2020-01-29: qty 30

## 2020-01-29 NOTE — Progress Notes (Signed)
Inpatient Diabetes Program Recommendations  AACE/ADA: New Consensus Statement on Inpatient Glycemic Control (2015)  Target Ranges:  Prepandial:   less than 140 mg/dL      Peak postprandial:   less than 180 mg/dL (1-2 hours)      Critically ill patients:  140 - 180 mg/dL   Lab Results  Component Value Date   GLUCAP 167 (H) 01/29/2020   HGBA1C 10.3 (H) 01/28/2020    Review of Glycemic Control Results for Sheryl White, Sheryl White (MRN [id number]) as of 01/29/2020 14:39  Ref. Range 01/28/2020 23:10 01/29/2020 00:49 01/29/2020 03:44 01/29/2020 07:35 01/29/2020 11:43  Glucose-Capillary Latest Ref Range: 70 - 99 mg/dL 161 (H) 166 (H) 177 (H) 203 (H) 167 (H)   Diabetes history: DM 1 since age 89 Home DM Meds: Insulin Pump  Current Orders: Levemir 30 units q 24 hours, Novolog resistant q 4 hours, Novolog 4 units tid with meals  Inpatient Diabetes Program Recommendations:    Spoke with patient by phone regarding whether she currently has insulin pump and supplies with her.  She states that she has insulin pump but now supplies.   I asked if she has any idea what may have caused DKA and she states she does not know.  I asked if it is possible that canula was bent and she states "I don't know, I didn't pay attention".  We discussed that often with insulin pumps, site failure or kinks in catheter can cause DKA.  She verbalized understanding.  She has not been hospitalized with DKA since 2017.  She see's Dr. Honor Junes.  Explained that she will need f/u visit with him after discharge due to DKA.  She states that she is currently okay with being on Basal/bolus regimen.  Explained that when she goes home, she should not resume insulin pump until 22-24 hours after basal insulin given.  She is due to get Levemir today at 14:30 so would not want to resume insulin pump until tomorrow around 12:30p.  Patient verbalized understanding and states that she has syringes to administer meal/correction coverage until insulin pump  resumed.  We discussed her A1C of 10.3% and she states that she anticipated that it would be high (A1C in March of 2020 was 10%). Discussed importance of controlling blood sugars.  Patient verbalized understanding.  She states that she doesn't have any needs related to diabetes at this time.  Reinforced need for better control and visit with endocrinologist.   Thanks,  Adah Perl, RN, BC-ADM Inpatient Diabetes Coordinator Pager [phone] (8a-5p)

## 2020-01-29 NOTE — Progress Notes (Signed)
PROGRESS NOTE    Sheryl White  [id number] DOB: [birth year] DOA: 01/27/2020 PCP: Lorelee Market, MD   Brief Narrative:  44 year old female history of type 1 diabetes on insulin pump presented with chief complaint of intractable nausea and vomiting x1 day.  Found to be in DKA.  Started on insulin GTT per Endo tool protocol  2/14: Patient seen and examined in the emergency department.  Initially was on insulin GTT.  Was able to titrate insulin drip off and transition to subcutaneous basal/bolus regimen.  Nausea and emesis improved.  Patient remains mildly tachycardic but improving.  2/15: Patient seen and examined.  Remains on MedSurg floor.  Diabetic ketoacidosis resolved.  Nausea improved.  Patient tolerating full liquid diet without issue.  Vital signs stable.   Assessment & Plan:   Principal Problem:   DKA (diabetic ketoacidoses) ([place]) Active Problems:   GERD   ARF (acute renal failure) (HCC)  Diabetic ketoacidosis, resolved Diabetes type 1, insulin-dependent, poorly controlled Patient has insulin pump but has had recurrent admissions for DKA Now presents with intractable nausea and vomiting secondary to above Was on insulin drip, now titrated off DKA resolved Plan: Subcutaneous insulin regimen as follows: -Lantus 30 units every morning -NovoLog 4 units 3 times daily with meals -Sensitive sliding scale -Carb controlled diet  Advance diet as tolerated today.  If patient remains stable she may be able to discharge home this afternoon, if not then tomorrow morning.  Intractable nausea and vomiting Improved, secondary to DKA As needed Zofran IV fluids   DVT prophylaxis: Lovenox Code Status: Full Family Communication: None Disposition Plan: Discharge home once tolerating p.o.  No barriers to discharge identified.  Anticipate discharge today or tomorrow.  Consultants:   None  Procedures:   None  Antimicrobials:   None   Subjective: Seen and  examined Nausea improved Tachycardic  Objective: Vitals:   01/28/20 1635 01/28/20 1719 01/28/20 1812 01/29/20 0737  BP: (!) 146/74 136/60 (!) 142/81 (!) 144/68  Pulse: (!) 115 (!) 115 (!) 121 98  Resp: 15 19  18   Temp: 98.7 F (37.1 C) 99.3 F (37.4 C) 98.9 F (37.2 C) 98.4 F (36.9 C)  TempSrc: Oral Oral Oral Oral  SpO2: 99% 100% 99% 98%  Weight:      Height:        Intake/Output Summary (Last 24 hours) at 01/29/2020 1118 Last data filed at 01/29/2020 0900 Gross per 24 hour  Intake 2857.01 ml  Output 1300 ml  Net 1557.01 ml   Filed Weights   01/27/20 1737  Weight: 83.9 kg    Examination:  General exam: Mild distress due to nausea Respiratory system: Slightly tachypneic, normal work of breathing Cardiovascular system: Tachycardic, no murmurs, no pedal edema  gastrointestinal system: Nontender, nondistended, normal bowel sounds Central nervous system: Alert and oriented. No focal neurological deficits. Extremities: Symmetric 5 x 5 power. Skin: No rashes, lesions or ulcers Psychiatry: Judgement and insight appear normal. Mood & affect appropriate.     Data Reviewed: I have personally reviewed following labs and imaging studies  CBC: Recent Labs  Lab 01/27/20 1752  WBC 25.1*  HGB 16.0*  HCT 48.4*  MCV 95.3  PLT [id number]*   Basic Metabolic Panel: Recent Labs  Lab 01/28/20 0753 01/28/20 1231 01/28/20 1932 01/28/20 2251 01/29/20 0245  NA 142 141 141 140 139  K 4.1 3.9 3.9 3.7 3.7  CL 110 109 110 110 110  CO2 18* 20* 18* 18* 21*  GLUCOSE 178* 264* 230* 201*  185*  BUN 24* 23* 18 16 15   CREATININE 0.98 1.00 0.83 0.87 0.73  CALCIUM 9.1 8.8* 8.8* 8.3* 8.6*  MG  --   --   --  1.9  --    GFR: Estimated Creatinine Clearance: 96.9 mL/min (by C-G formula based on SCr of 0.73 mg/dL). Liver Function Tests: No results for input(s): AST, ALT, ALKPHOS, BILITOT, PROT, ALBUMIN in the last 168 hours. No results for input(s): LIPASE, AMYLASE in the last 168 hours. No  results for input(s): AMMONIA in the last 168 hours. Coagulation Profile: No results for input(s): INR, PROTIME in the last 168 hours. Cardiac Enzymes: No results for input(s): CKTOTAL, CKMB, CKMBINDEX, TROPONINI in the last 168 hours. BNP (last 3 results) No results for input(s): PROBNP in the last 8760 hours. HbA1C: Recent Labs    01/28/20 0301  HGBA1C 10.3*   CBG: Recent Labs  Lab 01/28/20 2106 01/28/20 2310 01/29/20 0049 01/29/20 0344 01/29/20 0735  GLUCAP 188* 161* 166* 177* 203*   Lipid Profile: No results for input(s): CHOL, HDL, LDLCALC, TRIG, CHOLHDL, LDLDIRECT in the last 72 hours. Thyroid Function Tests: No results for input(s): TSH, T4TOTAL, FREET4, T3FREE, THYROIDAB in the last 72 hours. Anemia Panel: No results for input(s): VITAMINB12, FOLATE, FERRITIN, TIBC, IRON, RETICCTPCT in the last 72 hours. Sepsis Labs: No results for input(s): PROCALCITON, LATICACIDVEN in the last 168 hours.  Recent Results (from the past 240 hour(s))  Respiratory Panel by RT PCR (Flu A&B, Covid) - Nasopharyngeal Swab     Status: None   Collection Time: 01/27/20  7:41 PM   Specimen: Nasopharyngeal Swab  Result Value Ref Range Status   SARS Coronavirus 2 by RT PCR NEGATIVE NEGATIVE Final    Comment: (NOTE) SARS-CoV-2 target nucleic acids are NOT DETECTED. The SARS-CoV-2 RNA is generally detectable in upper respiratoy specimens during the acute phase of infection. The lowest concentration of SARS-CoV-2 viral copies this assay can detect is 131 copies/mL. A negative result does not preclude SARS-Cov-2 infection and should not be used as the sole basis for treatment or other patient management decisions. A negative result may occur with  improper specimen collection/handling, submission of specimen other than nasopharyngeal swab, presence of viral mutation(s) within the areas targeted by this assay, and inadequate number of viral copies (<131 copies/mL). A negative result must be  combined with clinical observations, patient history, and epidemiological information. The expected result is Negative. Fact Sheet for Patients:  PinkCheek.be Fact Sheet for Healthcare Providers:  GravelBags.it This test is not yet ap proved or cleared by the [place] FDA and  has been authorized for detection and/or diagnosis of SARS-CoV-2 by FDA under an Emergency Use Authorization (EUA). This EUA will remain  in effect (meaning this test can be used) for the duration of the COVID-19 declaration under Section 564(b)(1) of the Act, 21 U.S.C. section 360bbb-3(b)(1), unless the authorization is terminated or revoked sooner.    Influenza A by PCR NEGATIVE NEGATIVE Final   Influenza B by PCR NEGATIVE NEGATIVE Final    Comment: (NOTE) The Xpert Xpress SARS-CoV-2/FLU/RSV assay is intended as an aid in  the diagnosis of influenza from Nasopharyngeal swab specimens and  should not be used as a sole basis for treatment. Nasal washings and  aspirates are unacceptable for Xpert Xpress SARS-CoV-2/FLU/RSV  testing. Fact Sheet for Patients: PinkCheek.be Fact Sheet for Healthcare Providers: GravelBags.it This test is not yet approved or cleared by the [place] FDA and  has been authorized for detection and/or  diagnosis of SARS-CoV-2 by  FDA under an Emergency Use Authorization (EUA). This EUA will remain  in effect (meaning this test can be used) for the duration of the  Covid-19 declaration under Section 564(b)(1) of the Act, 21  U.S.C. section 360bbb-3(b)(1), unless the authorization is  terminated or revoked. Performed at [facility], [address] [place], [place] [address]          Radiology Studies: No results found.      Scheduled Meds: . enoxaparin (LOVENOX) injection  40 mg Subcutaneous Q24H  . insulin aspart  0-20 Units Subcutaneous  Q4H  . insulin aspart  4 Units Subcutaneous TID WC  . insulin detemir  30 Units Subcutaneous Q24H  . pantoprazole  40 mg Oral Daily   Continuous Infusions:    LOS: 2 days    Time spent: 35 minutes    Sidney Ace, MD [facility] Pager [phone]  If 7PM-7AM, please contact night-coverage 01/29/2020, 11:18 AM

## 2020-01-30 DIAGNOSIS — R112 Nausea with vomiting, unspecified: Secondary | ICD-10-CM

## 2020-01-30 LAB — BASIC METABOLIC PANEL
Anion gap: 8 (ref 5–15)
BUN: 11 mg/dL (ref 6–20)
CO2: 25 mmol/L (ref 22–32)
Calcium: 8.4 mg/dL — ABNORMAL LOW (ref 8.9–10.3)
Chloride: 105 mmol/L (ref 98–111)
Creatinine, Ser: 0.53 mg/dL (ref 0.44–1.00)
GFR calc Af Amer: >60 mL/min (ref >60)
GFR calc non Af Amer: >60 mL/min (ref >60)
Glucose, Bld: 127 mg/dL — ABNORMAL HIGH (ref 70–99)
Potassium: 3.3 mmol/L — ABNORMAL LOW (ref 3.5–5.1)
Sodium: 138 mmol/L (ref 135–145)

## 2020-01-30 LAB — GLUCOSE, CAPILLARY
Glucose-Capillary: 125 mg/dL — ABNORMAL HIGH (ref 70–99)
Glucose-Capillary: 190 mg/dL — ABNORMAL HIGH (ref 70–99)
Glucose-Capillary: 78 mg/dL (ref 70–99)

## 2020-01-30 MED ORDER — FLUCONAZOLE 100 MG PO TABS: 100.0000 mg | ORAL_TABLET | Freq: Every day | ORAL | 0 refills | Status: AC

## 2020-01-30 MED ORDER — ONDANSETRON HCL 4 MG PO TABS: 4.0000 mg | ORAL_TABLET | Freq: Every day | ORAL | 1 refills | Status: AC | PRN

## 2020-01-30 MED ORDER — FLUCONAZOLE 50 MG PO TABS
150.0000 mg | ORAL_TABLET | Freq: Once | ORAL | Status: AC
  Administered 2020-01-30: 150 mg via ORAL
  Filled 2020-01-30: qty 1

## 2020-01-30 NOTE — Discharge Summary (Signed)
Physician Discharge Summary  Sheryl White [id number] DOB: Jul 30, 1976 DOA: 01/27/2020  PCP: Lorelee Market, MD  Admit date: 01/27/2020 Discharge date: 01/30/2020  Admitted From: Home Disposition: Home  Recommendations for Outpatient Follow-up:  1. Follow up with PCP in 1-2 weeks 2. Follow-up with endocrinology for insulin pump  Home Health: No Equipment/Devices: None Discharge Condition: Stable CODE STATUS: Full Diet recommendation: Carb modified  Brief/Interim Summary: 44 year old female history of type 1 diabetes on insulin pump presented with chief complaint of intractable nausea and vomiting x1 day.  Found to be in DKA.  Started on insulin GTT per Endo tool protocol  2/14: Patient seen and examined in the emergency department.  Initially was on insulin GTT.  Was able to titrate insulin drip off and transition to subcutaneous basal/bolus regimen.  Nausea and emesis improved.  Patient remains mildly tachycardic but improving.  2/15: Patient seen and examined.  Remains on MedSurg floor.  Diabetic ketoacidosis resolved.  Nausea improved.  Patient tolerating full liquid diet without issue.  Vital signs stable.  2/16: Acidosis resolved.  Nausea improved.  Tolerating carb modified diet.  Stable for discharge home.   Discharge Diagnoses:  Principal Problem:   DKA (diabetic ketoacidoses) ([place]) Active Problems:   GERD   ARF (acute renal failure) ([facility]) Diabetic ketoacidosis, resolved Diabetes type 1, insulin-dependent, poorly controlled Patient has insulin pump but has had recurrent admissions for DKA Now presents with intractable nausea and vomiting secondary to above Was on insulin drip, now titrated off DKA resolved Subcutaneous insulin regimen as follows in hospital: -Lantus 30 units every morning -NovoLog 4 units 3 times daily with meals -Sensitive sliding scale -Carb controlled diet  Patient can resume her insulin pump today.  Last dose of subcutaneous Lantus  was 01/29/2020 at 1430.  Patient recommended to wait 20 to 24 hours after last insulin dose to initiate insulin pump.  Instructed her to resume insulin pump at approximately 1230-1430 today patient was understanding.  Intractable nausea and vomiting Improved, secondary to DKA As needed Zofran    Discharge Instructions  Discharge Instructions    Diet - low sodium heart healthy   Complete by: As directed    Discharge instructions   Complete by: As directed    Resume insulin pump between 1230 and 230pm today.  Please follow up with your endocrinologist   Increase activity slowly   Complete by: As directed      Allergies as of 01/30/2020   No Known Allergies     Medication List    TAKE these medications   fluconazole 100 MG tablet Commonly known as: Diflucan Take 1 tablet (100 mg total) by mouth daily for 5 days.   ibuprofen 800 MG tablet Commonly known as: ADVIL Take 800 mg by mouth every 8 (eight) hours as needed for mild pain or moderate pain.   insulin aspart 100 UNIT/ML injection Commonly known as: novoLOG Inject 0-100 Units into the skin continuous. Uses insulin pump   ondansetron 4 MG tablet Commonly known as: Zofran Take 1 tablet (4 mg total) by mouth daily as needed for nausea or vomiting.       No Known Allergies  Consultations:  None   Procedures/Studies:  No results found. None   Subjective: Seen and examined on the day of discharge No complaints, back to baseline Stable for discharge home  Discharge Exam: Vitals:   01/30/20 0041 01/30/20 0824  BP: 132/81 (!) 144/84  Pulse: 84 81  Resp:  16  Temp: 98.6 F (37  C)   SpO2: 97% 99%   Vitals:   01/29/20 0737 01/29/20 1521 01/30/20 0041 01/30/20 0824  BP: (!) 144/68 139/77 132/81 (!) 144/84  Pulse: 98 89 84 81  Resp: 18 18  16   Temp: 98.4 F (36.9 C) 99.7 F (37.6 C) 98.6 F (37 C)   TempSrc: Oral Oral Oral   SpO2: 98% 98% 97% 99%  Weight:      Height:        General: Pt is  alert, awake, not in acute distress Cardiovascular: RRR, S1/S2 +, no rubs, no gallops Respiratory: CTA bilaterally, no wheezing, no rhonchi Abdominal: Soft, NT, ND, bowel sounds + Extremities: no edema, no cyanosis    The results of significant diagnostics from this hospitalization (including imaging, microbiology, ancillary and laboratory) are listed below for reference.     Microbiology: Recent Results (from the past 240 hour(s))  Respiratory Panel by RT PCR (Flu A&B, Covid) - Nasopharyngeal Swab     Status: None   Collection Time: 01/27/20  7:41 PM   Specimen: Nasopharyngeal Swab  Result Value Ref Range Status   SARS Coronavirus 2 by RT PCR NEGATIVE NEGATIVE Final    Comment: (NOTE) SARS-CoV-2 target nucleic acids are NOT DETECTED. The SARS-CoV-2 RNA is generally detectable in upper respiratoy specimens during the acute phase of infection. The lowest concentration of SARS-CoV-2 viral copies this assay can detect is 131 copies/mL. A negative result does not preclude SARS-Cov-2 infection and should not be used as the sole basis for treatment or other patient management decisions. A negative result may occur with  improper specimen collection/handling, submission of specimen other than nasopharyngeal swab, presence of viral mutation(s) within the areas targeted by this assay, and inadequate number of viral copies (<131 copies/mL). A negative result must be combined with clinical observations, patient history, and epidemiological information. The expected result is Negative. Fact Sheet for Patients:  PinkCheek.be Fact Sheet for Healthcare Providers:  GravelBags.it This test is not yet ap proved or cleared by the [place] FDA and  has been authorized for detection and/or diagnosis of SARS-CoV-2 by FDA under an Emergency Use Authorization (EUA). This EUA will remain  in effect (meaning this test can be used) for the  duration of the COVID-19 declaration under Section 564(b)(1) of the Act, 21 U.S.C. section 360bbb-3(b)(1), unless the authorization is terminated or revoked sooner.    Influenza A by PCR NEGATIVE NEGATIVE Final   Influenza B by PCR NEGATIVE NEGATIVE Final    Comment: (NOTE) The Xpert Xpress SARS-CoV-2/FLU/RSV assay is intended as an aid in  the diagnosis of influenza from Nasopharyngeal swab specimens and  should not be used as a sole basis for treatment. Nasal washings and  aspirates are unacceptable for Xpert Xpress SARS-CoV-2/FLU/RSV  testing. Fact Sheet for Patients: PinkCheek.be Fact Sheet for Healthcare Providers: GravelBags.it This test is not yet approved or cleared by the [place] FDA and  has been authorized for detection and/or diagnosis of SARS-CoV-2 by  FDA under an Emergency Use Authorization (EUA). This EUA will remain  in effect (meaning this test can be used) for the duration of the  Covid-19 declaration under Section 564(b)(1) of the Act, 21  U.S.C. section 360bbb-3(b)(1), unless the authorization is  terminated or revoked. Performed at [facility], [address] [place], [place] [address]      Labs: BNP (last 3 results) No results for input(s): BNP in the last 8760 hours. Basic Metabolic Panel: Recent Labs  Lab 01/28/20 1231  01/28/20 1932 01/28/20 2251 01/29/20 0245 01/30/20 0418  NA 141 141 140 139 138  K 3.9 3.9 3.7 3.7 3.3*  CL 109 110 110 110 105  CO2 20* 18* 18* 21* 25  GLUCOSE 264* 230* 201* 185* 127*  BUN 23* 18 16 15 11   CREATININE 1.00 0.83 0.87 0.73 0.53  CALCIUM 8.8* 8.8* 8.3* 8.6* 8.4*  MG  --   --  1.9  --   --    Liver Function Tests: No results for input(s): AST, ALT, ALKPHOS, BILITOT, PROT, ALBUMIN in the last 168 hours. No results for input(s): LIPASE, AMYLASE in the last 168 hours. No results for input(s): AMMONIA in the last 168 hours. CBC: Recent  Labs  Lab 01/27/20 1752  WBC 25.1*  HGB 16.0*  HCT 48.4*  MCV 95.3  PLT 406*   Cardiac Enzymes: No results for input(s): CKTOTAL, CKMB, CKMBINDEX, TROPONINI in the last 168 hours. BNP: Invalid input(s): POCBNP CBG: Recent Labs  Lab 01/29/20 2152 01/29/20 2341 01/30/20 0039 01/30/20 0359 01/30/20 0822  GLUCAP 118* 102* 78 125* 190*   D-Dimer No results for input(s): DDIMER in the last 72 hours. Hgb A1c Recent Labs    01/28/20 0301  HGBA1C 10.3*   Lipid Profile No results for input(s): CHOL, HDL, LDLCALC, TRIG, CHOLHDL, LDLDIRECT in the last 72 hours. Thyroid function studies No results for input(s): TSH, T4TOTAL, T3FREE, THYROIDAB in the last 72 hours.  Invalid input(s): FREET3 Anemia work up No results for input(s): VITAMINB12, FOLATE, FERRITIN, TIBC, IRON, RETICCTPCT in the last 72 hours. Urinalysis    Component Value Date/Time   COLORURINE YELLOW (A) 01/28/2020 0804   APPEARANCEUR CLEAR (A) 01/28/2020 0804   LABSPEC 1.021 01/28/2020 0804   PHURINE 5.0 01/28/2020 0804   GLUCOSEU >=500 (A) 01/28/2020 0804   HGBUR NEGATIVE 01/28/2020 0804   BILIRUBINUR NEGATIVE 01/28/2020 0804   KETONESUR 80 (A) 01/28/2020 0804   PROTEINUR 30 (A) 01/28/2020 0804   UROBILINOGEN 0.2 03/30/2015 1930   NITRITE NEGATIVE 01/28/2020 0804   LEUKOCYTESUR NEGATIVE 01/28/2020 0804   Sepsis Labs Invalid input(s): PROCALCITONIN,  WBC,  LACTICIDVEN Microbiology Recent Results (from the past 240 hour(s))  Respiratory Panel by RT PCR (Flu A&B, Covid) - Nasopharyngeal Swab     Status: None   Collection Time: 01/27/20  7:41 PM   Specimen: Nasopharyngeal Swab  Result Value Ref Range Status   SARS Coronavirus 2 by RT PCR NEGATIVE NEGATIVE Final    Comment: (NOTE) SARS-CoV-2 target nucleic acids are NOT DETECTED. The SARS-CoV-2 RNA is generally detectable in upper respiratoy specimens during the acute phase of infection. The lowest concentration of SARS-CoV-2 viral copies this assay can  detect is 131 copies/mL. A negative result does not preclude SARS-Cov-2 infection and should not be used as the sole basis for treatment or other patient management decisions. A negative result may occur with  improper specimen collection/handling, submission of specimen other than nasopharyngeal swab, presence of viral mutation(s) within the areas targeted by this assay, and inadequate number of viral copies (<131 copies/mL). A negative result must be combined with clinical observations, patient history, and epidemiological information. The expected result is Negative. Fact Sheet for Patients:  PinkCheek.be Fact Sheet for Healthcare Providers:  GravelBags.it This test is not yet ap proved or cleared by the [place] FDA and  has been authorized for detection and/or diagnosis of SARS-CoV-2 by FDA under an Emergency Use Authorization (EUA). This EUA will remain  in effect (meaning this test can be used)  for the duration of the COVID-19 declaration under Section 564(b)(1) of the Act, 21 U.S.C. section 360bbb-3(b)(1), unless the authorization is terminated or revoked sooner.    Influenza A by PCR NEGATIVE NEGATIVE Final   Influenza B by PCR NEGATIVE NEGATIVE Final    Comment: (NOTE) The Xpert Xpress SARS-CoV-2/FLU/RSV assay is intended as an aid in  the diagnosis of influenza from Nasopharyngeal swab specimens and  should not be used as a sole basis for treatment. Nasal washings and  aspirates are unacceptable for Xpert Xpress SARS-CoV-2/FLU/RSV  testing. Fact Sheet for Patients: PinkCheek.be Fact Sheet for Healthcare Providers: GravelBags.it This test is not yet approved or cleared by the [place] FDA and  has been authorized for detection and/or diagnosis of SARS-CoV-2 by  FDA under an Emergency Use Authorization (EUA). This EUA will remain  in effect (meaning  this test can be used) for the duration of the  Covid-19 declaration under Section 564(b)(1) of the Act, 21  U.S.C. section 360bbb-3(b)(1), unless the authorization is  terminated or revoked. Performed at [facility], [address], [place], [place] [address]      Time coordinating discharge: Over 30 minutes  SIGNED:   Sidney Ace, MD  [facility] 01/30/2020, 11:11 AM Pager   If 7PM-7AM, please contact night-coverage

## 2020-01-30 NOTE — Progress Notes (Signed)
Received MD order to discharge patient to home, reviewed home meds, discharge instructions prescriptions and follow up appointment with patient and patient verbalized understanding

## 2020-05-10 ENCOUNTER — Other Ambulatory Visit: Payer: Self-pay | Admitting: Orthopedic Surgery

## 2021-04-27 ENCOUNTER — Emergency Department: Payer: BC Managed Care – PPO

## 2021-04-27 ENCOUNTER — Other Ambulatory Visit: Payer: Self-pay

## 2021-04-27 ENCOUNTER — Emergency Department
Admission: EM | Admit: 2021-04-27 | Discharge: 2021-04-27 | Disposition: A | Discharge status: Home / Self Care | Payer: BC Managed Care – PPO | Attending: Emergency Medicine | Admitting: Emergency Medicine

## 2021-04-27 DIAGNOSIS — S42001A Fracture of unspecified part of right clavicle, initial encounter for closed fracture: Secondary | ICD-10-CM

## 2021-04-27 DIAGNOSIS — Z794 Long term (current) use of insulin: Secondary | ICD-10-CM | POA: Insufficient documentation

## 2021-04-27 DIAGNOSIS — E109 Type 1 diabetes mellitus without complications: Secondary | ICD-10-CM | POA: Insufficient documentation

## 2021-04-27 DIAGNOSIS — F17210 Nicotine dependence, cigarettes, uncomplicated: Secondary | ICD-10-CM | POA: Insufficient documentation

## 2021-04-27 DIAGNOSIS — S4991XA Unspecified injury of right shoulder and upper arm, initial encounter: Secondary | ICD-10-CM | POA: Diagnosis present

## 2021-04-27 DIAGNOSIS — R0789 Other chest pain: Secondary | ICD-10-CM | POA: Insufficient documentation

## 2021-04-27 DIAGNOSIS — S42021A Displaced fracture of shaft of right clavicle, initial encounter for closed fracture: Secondary | ICD-10-CM | POA: Diagnosis not present

## 2021-04-27 MED ORDER — HYDROCODONE-ACETAMINOPHEN 5-325 MG PO TABS: 2.0000 | ORAL_TABLET | Freq: Once | ORAL | Status: DC

## 2021-04-27 MED ORDER — HYDROCODONE-ACETAMINOPHEN 5-325 MG PO TABS: 1.0000 | ORAL_TABLET | Freq: Four times a day (QID) | ORAL | 0 refills | Status: DC | PRN

## 2021-04-27 MED ORDER — HYDROCODONE-ACETAMINOPHEN 5-325 MG PO TABS
1.0000 | ORAL_TABLET | Freq: Once | ORAL | Status: AC
  Administered 2021-04-27: 1 via ORAL
  Filled 2021-04-27: qty 1
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
## 2021-04-27 NOTE — Discharge Instructions (Signed)
Please follow up with your orthopedic surgeon. Please seek medical care if you have any change in sensation to that arm, discoloration, inability to move or any other new or concerning symptoms.

## 2021-05-09 ENCOUNTER — Other Ambulatory Visit: Payer: Self-pay | Admitting: Orthopedic Surgery

## 2021-10-30 ENCOUNTER — Other Ambulatory Visit: Payer: Self-pay

## 2021-10-30 ENCOUNTER — Other Ambulatory Visit: Payer: Self-pay | Admitting: Family Medicine

## 2021-10-30 ENCOUNTER — Ambulatory Visit: Payer: Self-pay

## 2021-10-30 DIAGNOSIS — M79671 Pain in right foot: Secondary | ICD-10-CM

## 2022-03-14 ENCOUNTER — Other Ambulatory Visit: Payer: Self-pay

## 2022-03-14 ENCOUNTER — Encounter (HOSPITAL_COMMUNITY): Payer: Self-pay | Admitting: Emergency Medicine

## 2022-03-14 ENCOUNTER — Observation Stay (HOSPITAL_COMMUNITY)
Admission: EM | Admit: 2022-03-14 | Discharge: 2022-03-15 | Disposition: A | Discharge status: Home / Self Care | Payer: BC Managed Care – PPO | Attending: Family Medicine | Admitting: Family Medicine

## 2022-03-14 ENCOUNTER — Observation Stay (HOSPITAL_COMMUNITY): Payer: BC Managed Care – PPO

## 2022-03-14 DIAGNOSIS — E1010 Type 1 diabetes mellitus with ketoacidosis without coma: Secondary | ICD-10-CM | POA: Diagnosis not present

## 2022-03-14 DIAGNOSIS — N179 Acute kidney failure, unspecified: Secondary | ICD-10-CM | POA: Diagnosis not present

## 2022-03-14 DIAGNOSIS — R0602 Shortness of breath: Secondary | ICD-10-CM | POA: Insufficient documentation

## 2022-03-14 DIAGNOSIS — R112 Nausea with vomiting, unspecified: Secondary | ICD-10-CM | POA: Diagnosis present

## 2022-03-14 DIAGNOSIS — K219 Gastro-esophageal reflux disease without esophagitis: Secondary | ICD-10-CM | POA: Diagnosis present

## 2022-03-14 DIAGNOSIS — Z20822 Contact with and (suspected) exposure to covid-19: Secondary | ICD-10-CM | POA: Insufficient documentation

## 2022-03-14 DIAGNOSIS — Z7982 Long term (current) use of aspirin: Secondary | ICD-10-CM | POA: Diagnosis not present

## 2022-03-14 DIAGNOSIS — R6511 Systemic inflammatory response syndrome (SIRS) of non-infectious origin with acute organ dysfunction: Secondary | ICD-10-CM | POA: Diagnosis not present

## 2022-03-14 DIAGNOSIS — F17210 Nicotine dependence, cigarettes, uncomplicated: Secondary | ICD-10-CM | POA: Insufficient documentation

## 2022-03-14 DIAGNOSIS — Z794 Long term (current) use of insulin: Secondary | ICD-10-CM | POA: Diagnosis not present

## 2022-03-14 DIAGNOSIS — R6510 Systemic inflammatory response syndrome (SIRS) of non-infectious origin without acute organ dysfunction: Secondary | ICD-10-CM | POA: Diagnosis present

## 2022-03-14 DIAGNOSIS — I10 Essential (primary) hypertension: Secondary | ICD-10-CM | POA: Diagnosis not present

## 2022-03-14 DIAGNOSIS — Z79899 Other long term (current) drug therapy: Secondary | ICD-10-CM | POA: Insufficient documentation

## 2022-03-14 DIAGNOSIS — Z720 Tobacco use: Secondary | ICD-10-CM | POA: Diagnosis present

## 2022-03-14 DIAGNOSIS — G3184 Mild cognitive impairment, so stated: Secondary | ICD-10-CM | POA: Diagnosis present

## 2022-03-14 HISTORY — DX: Essential (primary) hypertension: I10

## 2022-03-14 LAB — URINALYSIS, ROUTINE W REFLEX MICROSCOPIC
Bacteria, UA: NONE SEEN
Bilirubin Urine: NEGATIVE
Glucose, UA: >=500 mg/dL — AB
Hgb urine dipstick: NEGATIVE
Ketones, ur: 80 mg/dL — AB
Leukocytes,Ua: NEGATIVE
Nitrite: NEGATIVE
Protein, ur: 30 mg/dL — AB
Specific Gravity, Urine: 1.024 (ref 1.005–1.030)
pH: 5 (ref 5.0–8.0)

## 2022-03-14 LAB — CBC WITH DIFFERENTIAL/PLATELET
Abs Immature Granulocytes: 0.17 10*3/uL — ABNORMAL HIGH (ref 0.00–0.07)
Basophils Absolute: 0 10*3/uL (ref 0.0–0.1)
Basophils Relative: 0 %
Eosinophils Absolute: 0 10*3/uL (ref 0.0–0.5)
Eosinophils Relative: 0 %
HCT: 46.6 % — ABNORMAL HIGH (ref 36.0–46.0)
Hemoglobin: 16.1 g/dL — ABNORMAL HIGH (ref 12.0–15.0)
Immature Granulocytes: 1 %
Lymphocytes Relative: 2 %
Lymphs Abs: 0.5 10*3/uL — ABNORMAL LOW (ref 0.7–4.0)
MCH: 31.5 pg (ref 26.0–34.0)
MCHC: 34.5 g/dL (ref 30.0–36.0)
MCV: 91.2 fL (ref 80.0–100.0)
Monocytes Absolute: 1.2 10*3/uL — ABNORMAL HIGH (ref 0.1–1.0)
Monocytes Relative: 5 %
Neutro Abs: 21.5 10*3/uL — ABNORMAL HIGH (ref 1.7–7.7)
Neutrophils Relative %: 92 %
Platelets: 374 10*3/uL (ref 150–400)
RBC: 5.11 MIL/uL (ref 3.87–5.11)
RDW: 12.1 % (ref 11.5–15.5)
WBC: 23.4 10*3/uL — ABNORMAL HIGH (ref 4.0–10.5)
nRBC: 0 % (ref 0.0–0.2)

## 2022-03-14 LAB — CBG MONITORING, ED
Glucose-Capillary: 433 mg/dL — ABNORMAL HIGH (ref 70–99)
Glucose-Capillary: 430 mg/dL — ABNORMAL HIGH (ref 70–99)
Glucose-Capillary: 269 mg/dL — ABNORMAL HIGH (ref 70–99)
Glucose-Capillary: 361 mg/dL — ABNORMAL HIGH (ref 70–99)
Glucose-Capillary: 127 mg/dL — ABNORMAL HIGH (ref 70–99)
Glucose-Capillary: 280 mg/dL — ABNORMAL HIGH (ref 70–99)

## 2022-03-14 LAB — BASIC METABOLIC PANEL
Anion gap: 25 — ABNORMAL HIGH (ref 5–15)
Anion gap: 17 — ABNORMAL HIGH (ref 5–15)
Anion gap: 16 — ABNORMAL HIGH (ref 5–15)
Anion gap: 8 (ref 5–15)
BUN: 19 mg/dL (ref 6–20)
BUN: 21 mg/dL — ABNORMAL HIGH (ref 6–20)
BUN: 21 mg/dL — ABNORMAL HIGH (ref 6–20)
BUN: 21 mg/dL — ABNORMAL HIGH (ref 6–20)
CO2: 17 mmol/L — ABNORMAL LOW (ref 22–32)
CO2: 21 mmol/L — ABNORMAL LOW (ref 22–32)
CO2: 21 mmol/L — ABNORMAL LOW (ref 22–32)
CO2: 27 mmol/L (ref 22–32)
Calcium: 9.4 mg/dL (ref 8.9–10.3)
Calcium: 9.4 mg/dL (ref 8.9–10.3)
Calcium: 9 mg/dL (ref 8.9–10.3)
Calcium: 8.9 mg/dL (ref 8.9–10.3)
Chloride: 98 mmol/L (ref 98–111)
Chloride: 104 mmol/L (ref 98–111)
Chloride: 102 mmol/L (ref 98–111)
Chloride: 103 mmol/L (ref 98–111)
Creatinine, Ser: 1.36 mg/dL — ABNORMAL HIGH (ref 0.44–1.00)
Creatinine, Ser: 1.38 mg/dL — ABNORMAL HIGH (ref 0.44–1.00)
Creatinine, Ser: 1.4 mg/dL — ABNORMAL HIGH (ref 0.44–1.00)
Creatinine, Ser: 1.15 mg/dL — ABNORMAL HIGH (ref 0.44–1.00)
GFR, Estimated: 48 mL/min — ABNORMAL LOW (ref >60)
GFR, Estimated: 48 mL/min — ABNORMAL LOW (ref >60)
GFR, Estimated: 47 mL/min — ABNORMAL LOW (ref >60)
GFR, Estimated: 59 mL/min — ABNORMAL LOW (ref >60)
Glucose, Bld: 510 mg/dL (ref 70–99)
Glucose, Bld: 254 mg/dL — ABNORMAL HIGH (ref 70–99)
Glucose, Bld: 307 mg/dL — ABNORMAL HIGH (ref 70–99)
Glucose, Bld: 240 mg/dL — ABNORMAL HIGH (ref 70–99)
Potassium: 4.8 mmol/L (ref 3.5–5.1)
Potassium: 4 mmol/L (ref 3.5–5.1)
Potassium: 3.8 mmol/L (ref 3.5–5.1)
Potassium: 3.3 mmol/L — ABNORMAL LOW (ref 3.5–5.1)
Sodium: 140 mmol/L (ref 135–145)
Sodium: 142 mmol/L (ref 135–145)
Sodium: 139 mmol/L (ref 135–145)
Sodium: 138 mmol/L (ref 135–145)

## 2022-03-14 LAB — HEMOGLOBIN A1C
Hgb A1c MFr Bld: 10.2 % — ABNORMAL HIGH (ref 4.8–5.6)
Mean Plasma Glucose: 246.04 mg/dL

## 2022-03-14 LAB — I-STAT VENOUS BLOOD GAS, ED
Acid-base deficit: 3 mmol/L — ABNORMAL HIGH (ref 0.0–2.0)
Bicarbonate: 19.1 mmol/L — ABNORMAL LOW (ref 20.0–28.0)
Calcium, Ion: 0.93 mmol/L — ABNORMAL LOW (ref 1.15–1.40)
HCT: 46 % (ref 36.0–46.0)
Hemoglobin: 15.6 g/dL — ABNORMAL HIGH (ref 12.0–15.0)
O2 Saturation: 100 %
Potassium: 4.6 mmol/L (ref 3.5–5.1)
Sodium: 135 mmol/L (ref 135–145)
TCO2: 20 mmol/L — ABNORMAL LOW (ref 22–32)
pCO2, Ven: 25.6 mmHg — ABNORMAL LOW (ref 44–60)
pH, Ven: 7.481 — ABNORMAL HIGH (ref 7.25–7.43)
pO2, Ven: 152 mmHg — ABNORMAL HIGH (ref 32–45)

## 2022-03-14 LAB — COMPREHENSIVE METABOLIC PANEL
ALT: 23 U/L (ref 0–44)
AST: 24 U/L (ref 15–41)
Albumin: 4.4 g/dL (ref 3.5–5.0)
Alkaline Phosphatase: 93 U/L (ref 38–126)
Anion gap: 24 — ABNORMAL HIGH (ref 5–15)
BUN: 18 mg/dL (ref 6–20)
CO2: 20 mmol/L — ABNORMAL LOW (ref 22–32)
Calcium: 10 mg/dL (ref 8.9–10.3)
Chloride: 94 mmol/L — ABNORMAL LOW (ref 98–111)
Creatinine, Ser: 1.32 mg/dL — ABNORMAL HIGH (ref 0.44–1.00)
GFR, Estimated: 50 mL/min — ABNORMAL LOW (ref >60)
Glucose, Bld: 467 mg/dL — ABNORMAL HIGH (ref 70–99)
Potassium: 4.5 mmol/L (ref 3.5–5.1)
Sodium: 138 mmol/L (ref 135–145)
Total Bilirubin: 1.7 mg/dL — ABNORMAL HIGH (ref 0.3–1.2)
Total Protein: 7.5 g/dL (ref 6.5–8.1)

## 2022-03-14 LAB — GLUCOSE, CAPILLARY
Glucose-Capillary: 289 mg/dL — ABNORMAL HIGH (ref 70–99)
Glucose-Capillary: 321 mg/dL — ABNORMAL HIGH (ref 70–99)
Glucose-Capillary: 272 mg/dL — ABNORMAL HIGH (ref 70–99)
Glucose-Capillary: 240 mg/dL — ABNORMAL HIGH (ref 70–99)
Glucose-Capillary: 195 mg/dL — ABNORMAL HIGH (ref 70–99)
Glucose-Capillary: 217 mg/dL — ABNORMAL HIGH (ref 70–99)

## 2022-03-14 LAB — D-DIMER, QUANTITATIVE: D-Dimer, Quant: 0.65 ug/mL-FEU — ABNORMAL HIGH (ref 0.00–0.50)

## 2022-03-14 LAB — RESP PANEL BY RT-PCR (FLU A&B, COVID) ARPGX2
Influenza A by PCR: NEGATIVE
Influenza B by PCR: NEGATIVE
SARS Coronavirus 2 by RT PCR: NEGATIVE

## 2022-03-14 LAB — HEMOGLOBIN AND HEMATOCRIT, BLOOD
HCT: 46 % (ref 36.0–46.0)
Hemoglobin: 15.7 g/dL — ABNORMAL HIGH (ref 12.0–15.0)

## 2022-03-14 LAB — I-STAT BETA HCG BLOOD, ED (MC, WL, AP ONLY)
I-stat hCG, quantitative: <5 m[IU]/mL (ref <5)
I-stat hCG, quantitative: <5 m[IU]/mL (ref <5)

## 2022-03-14 LAB — LIPASE, BLOOD: Lipase: 27 U/L (ref 11–51)

## 2022-03-14 LAB — MAGNESIUM: Magnesium: 1.8 mg/dL (ref 1.7–2.4)

## 2022-03-14 LAB — LACTIC ACID, PLASMA
Lactic Acid, Venous: 3.3 mmol/L (ref 0.5–1.9)
Lactic Acid, Venous: 2.6 mmol/L (ref 0.5–1.9)

## 2022-03-14 LAB — ETHANOL: Alcohol, Ethyl (B): <10 mg/dL (ref <10)

## 2022-03-14 LAB — HIV ANTIBODY (ROUTINE TESTING W REFLEX): HIV Screen 4th Generation wRfx: NONREACTIVE

## 2022-03-14 LAB — BETA-HYDROXYBUTYRIC ACID
Beta-Hydroxybutyric Acid: 7.84 mmol/L — ABNORMAL HIGH (ref 0.05–0.27)
Beta-Hydroxybutyric Acid: 6.56 mmol/L — ABNORMAL HIGH (ref 0.05–0.27)

## 2022-03-14 MED ORDER — SODIUM CHLORIDE 0.9% FLUSH
3.0000 mL | Freq: Two times a day (BID) | INTRAVENOUS | Status: DC
  Administered 2022-03-15: 3 mL via INTRAVENOUS

## 2022-03-14 MED ORDER — ATORVASTATIN CALCIUM 10 MG PO TABS
20.0000 mg | ORAL_TABLET | Freq: Every day | ORAL | Status: DC
  Administered 2022-03-15: 20 mg via ORAL
  Filled 2022-03-14: qty 2

## 2022-03-14 MED ORDER — SUCRALFATE 1 GM/10ML PO SUSP
1.0000 g | Freq: Once | ORAL | Status: AC
  Administered 2022-03-14: 1 g via ORAL
  Filled 2022-03-14: qty 10

## 2022-03-14 MED ORDER — FAMOTIDINE 20 MG PO TABS
20.0000 mg | ORAL_TABLET | Freq: Two times a day (BID) | ORAL | Status: DC
  Administered 2022-03-14 – 2022-03-15 (×2): 20 mg via ORAL
  Filled 2022-03-14 (×2): qty 1

## 2022-03-14 MED ORDER — ONDANSETRON HCL 4 MG/2ML IJ SOLN
4.0000 mg | Freq: Four times a day (QID) | INTRAMUSCULAR | Status: DC | PRN
Start: 2022-03-14 — End: 2022-03-15

## 2022-03-14 MED ORDER — ACETAMINOPHEN 325 MG PO TABS: 650.0000 mg | ORAL_TABLET | Freq: Four times a day (QID) | ORAL | Status: DC | PRN

## 2022-03-14 MED ORDER — LACTATED RINGERS IV SOLN: INTRAVENOUS | Status: DC

## 2022-03-14 MED ORDER — LACTATED RINGERS IV BOLUS
20.0000 mL/kg | Freq: Once | INTRAVENOUS | Status: AC
  Administered 2022-03-14: 1600 mL via INTRAVENOUS

## 2022-03-14 MED ORDER — PHENOL 1.4 % MT LIQD
1.0000 | OROMUCOSAL | Status: DC | PRN
  Filled 2022-03-14: qty 177

## 2022-03-14 MED ORDER — ONDANSETRON HCL 4 MG PO TABS: 4.0000 mg | ORAL_TABLET | Freq: Four times a day (QID) | ORAL | Status: DC | PRN

## 2022-03-14 MED ORDER — HYDRALAZINE HCL 20 MG/ML IJ SOLN: 10.0000 mg | INTRAMUSCULAR | Status: DC | PRN

## 2022-03-14 MED ORDER — LIDOCAINE VISCOUS HCL 2 % MT SOLN
15.0000 mL | Freq: Once | OROMUCOSAL | Status: AC
  Administered 2022-03-14: 15 mL via OROMUCOSAL
  Filled 2022-03-14: qty 15

## 2022-03-14 MED ORDER — FAMOTIDINE IN NACL 20-0.9 MG/50ML-% IV SOLN
20.0000 mg | Freq: Once | INTRAVENOUS | Status: AC
  Administered 2022-03-14: 20 mg via INTRAVENOUS
  Filled 2022-03-14: qty 50

## 2022-03-14 MED ORDER — ACETAMINOPHEN 650 MG RE SUPP: 650.0000 mg | Freq: Four times a day (QID) | RECTAL | Status: DC | PRN

## 2022-03-14 MED ORDER — DEXTROSE 50 % IV SOLN: 0.0000 mL | INTRAVENOUS | Status: DC | PRN

## 2022-03-14 MED ORDER — ONDANSETRON HCL 4 MG/2ML IJ SOLN
4.0000 mg | Freq: Once | INTRAMUSCULAR | Status: AC
  Administered 2022-03-14: 4 mg via INTRAVENOUS
  Filled 2022-03-14: qty 2

## 2022-03-14 MED ORDER — ENOXAPARIN SODIUM 40 MG/0.4ML IJ SOSY
40.0000 mg | PREFILLED_SYRINGE | INTRAMUSCULAR | Status: DC
  Administered 2022-03-14: 40 mg via SUBCUTANEOUS
  Filled 2022-03-14: qty 0.4

## 2022-03-14 MED ORDER — FLUCONAZOLE IN SODIUM CHLORIDE 200-0.9 MG/100ML-% IV SOLN
200.0000 mg | Freq: Once | INTRAVENOUS | Status: AC
  Administered 2022-03-14: 200 mg via INTRAVENOUS
  Filled 2022-03-14: qty 100

## 2022-03-14 MED ORDER — INSULIN REGULAR(HUMAN) IN NACL 100-0.9 UT/100ML-% IV SOLN
INTRAVENOUS | Status: DC
  Administered 2022-03-14: 7 [IU]/h via INTRAVENOUS
  Administered 2022-03-14: 8.5 [IU]/h via INTRAVENOUS
  Filled 2022-03-14 (×2): qty 100

## 2022-03-14 MED ORDER — ALBUTEROL SULFATE (2.5 MG/3ML) 0.083% IN NEBU: 2.5000 mg | INHALATION_SOLUTION | Freq: Four times a day (QID) | RESPIRATORY_TRACT | Status: DC | PRN

## 2022-03-14 MED ORDER — SODIUM CHLORIDE 0.9 % IV BOLUS
1000.0000 mL | Freq: Once | INTRAVENOUS | Status: AC
  Administered 2022-03-14: 1000 mL via INTRAVENOUS

## 2022-03-14 MED ORDER — POTASSIUM CHLORIDE 10 MEQ/100ML IV SOLN
10.0000 meq | INTRAVENOUS | Status: AC
  Administered 2022-03-14 (×2): 10 meq via INTRAVENOUS
  Filled 2022-03-14 (×2): qty 100

## 2022-03-14 MED ORDER — DEXTROSE IN LACTATED RINGERS 5 % IV SOLN: INTRAVENOUS | Status: DC

## 2022-03-14 NOTE — Progress Notes (Signed)
03/14/2022 Patient transfer from the emergency room to 5N16 at [place]. She is alert, orient to person, place, time and situation. She was received on the unit and progressive monitor was placed. Patient also is on the endo tool when arrive on unit. [facility] Rn. ?

## 2022-03-14 NOTE — Assessment & Plan Note (Addendum)
On admission patient had presented with CO2 20, glucose 467, and anion gap of 24.  Last available hemoglobin A1c was 11 on 12/18/2020. ?-Admit to progressive unit ?-Check hemoglobin A1c ?-Hyperglycemia protocol initiated  ?-Serial BMPs, beta hydroxybutyrate hemoglobin A1c in a.m.  ?-Correct electrolytes as needed ?- Monitoring for AG closure and will transition to subcutaneous insulin once able ?- Social work and care management consult for need of PCP and barriers of ?- Diabetes education for possible need of ? ? ? ?

## 2022-03-14 NOTE — Progress Notes (Signed)
Inpatient Diabetes Program Recommendations ? ?AACE/ADA: New Consensus Statement on Inpatient Glycemic Control (2015) ? ?Target Ranges:  Prepandial:   less than 140 mg/dL ?     Peak postprandial:   less than 180 mg/dL (1-2 hours) ?     Critically ill patients:  140 - 180 mg/dL  ? ? Latest Reference Range & Units 03/14/22 11:26  ?Sodium 135 - 145 mmol/L 140  ?Potassium 3.5 - 5.1 mmol/L 4.8  ?Chloride 98 - 111 mmol/L 98  ?CO2 22 - 32 mmol/L 17 (L)  ?Glucose 70 - 99 mg/dL 510 (HH)  ?BUN 6 - 20 mg/dL 19  ?Creatinine 0.44 - 1.00 mg/dL 1.36 (H)  ?Calcium 8.9 - 10.3 mg/dL 9.4  ?Anion gap 5 - 15  25 (H)  ? ? Latest Reference Range & Units 03/14/22 11:15 03/14/22 11:58 03/14/22 12:31  ?Glucose-Capillary 70 - 99 mg/dL 433 (H) ? ?IV Insulin Drip Started _0  430 (H) 361 (H)  ? ? ?Admit with: DKA ? ?History: Type 1 diabetes ? ?Home DM Meds: Insulin Pump with Novolog ? ?Current Orders: IV Insulin Drip ? ? ?MD- Please leave pt on the IV insulin drip until the following parameters have been met: ?Glucose stable <180 for 4 consecutive readings ?CO2 level 20 or higher ?Anion Gap 12 or less ? ?When pt ready to transition to SQ Insulin, she can resume her Home Insulin Pump, however, pt will need to resume her pump with all new/fresh pump supplies (pt will need to have family bring supplies to hospital as hospital does not carry insulin pump supplies).  When pt ready to transition, have pt resume her home insulin pump with new supplies, continue IV Insulin Drip for 2 hours after pump restarted, and then can d/c the IV Insulin Drip.  Will need Insulin Pump Orders placed at that time. ? ? ? ?Endocrinologist: Dr. Honor Junes with [facility] ?Last seen 12/18/2020 ?Insulin Pump Settings at that visit were as follows: ?Medtronic 700 series pump with NovoLog:  ?Basal rates ?MN = 1.4 ?8 AM = 1.5 ?TDD basal: 35.4 units ? ?Bolus settings ?I/C: 15 ?ISF: 50 ?Target Glucose: 110-120 ?Active insulin time: 4 hours ?Next appt with ENDO is  04/15/2022 ? ?--Will follow patient during hospitalization-- ? ?Wyn Quaker RN, MSN, CDE ?Diabetes Coordinator ?Inpatient Glycemic Control Team ?Team Pager: [phone] (8a-5p) ? ? ? ?

## 2022-03-14 NOTE — ED Triage Notes (Signed)
Pt has wrote on a piece of paper that she can't talk because her throat is raw.  Reports nausea and vomiting black emesis since last night.  Reports SOB.  Denies abd pain and chest pain.  Tachycardic. ?

## 2022-03-14 NOTE — Assessment & Plan Note (Addendum)
On admission creatinine 1.32 with BUN 18.  Baseline creatinine previously had been 0.5-0.8. With reports of nausea and vomiting suspect likely prerenal in nature.  Patient has been bolused IV fluids. ?-Continue IV fluids per hyperglycemia protocol  ?-Avoid nephrotoxic agents ?-Follow-up urinalysis ?-Continue to monitor kidney ?

## 2022-03-14 NOTE — Assessment & Plan Note (Signed)
Patient had been given Pepcid IV x1 dose in the ED.  Home medication regimen includes Pepcid 20 mg p.o. twice daily. ?-Continue Pepcid  ?

## 2022-03-14 NOTE — H&P (Addendum)
?History and Physical  ? ? ?Patient: Sheryl White MVE:[id number] DOB: [birth year] ?DOA: 03/14/2022 ?DOS: the patient was seen and examined on 03/14/2022 ?PCP: [place], [place]  ?Patient coming from: Home ? ?Chief Complaint:  ?Chief Complaint  ?Patient presents with  ? Tachycardia  ? Vomiting  ? Shortness of Breath  ? ?HPI: AERIAL White is a 46 y.o. female with medical history significant of diabetes mellitus type 1, dyslipidemia, anxiety, depression, and GERD who presents with complaints of nausea and vomiting.  She states that she had not been feeling well 3 days ago, but the following day felt better.  However, symptoms returned yesterday with reports of nausea and vomiting.  She has vomited approximately 12 times.  Emesis was initially stomach contents, but she reports that it had turned black in color yesterday afternoon.  She has not had anything to eat or drink since that time.  She complains of associated symptoms of generalized crampy abdominal pain, sore throat, and reports having a boil on her buttock.  She thinks that the boil had opened up.  Patient reports that she takes 800 mg of ibuprofen and every other day or so for knee pain.   ? ?Patient was noted to be afebrile with heart rate 117-131,respiration 22, and all other vital signs maintained.  Of the available labs CO2 was noted to be 20, BUN 18, creatinine 1.32 glucose 467, anion gap 24, total bilirubin 1.7, lactic acid 3.3, and urine pregnancy screen negative.  CBC, urinalysis, and venous blood gas had not been resulted.  Patient had been ordered 2.6 L bolus of l fluids, potassium chloride 20 mEq, Carafate, Zofran IV, and Pepcid IV. ? ?Review of Systems: As mentioned in the history of present illness. All other systems reviewed and are negative. ?Past Medical History:  ?Diagnosis Date  ? Angina   ? Anxiety   ? Depression   ? Diabetes mellitus   ? Dyslipidemia   ? Endometriosis   ? GERD (gastroesophageal reflux disease)   ? Ovarian cyst   ? ?Past  Surgical History:  ?Procedure Laterality Date  ? ABDOMINAL HYSTERECTOMY    ? still have left ovary  ? CARPAL TUNNEL RELEASE    ? bilateral  ? CESAREAN SECTION    ? x 1  ? COLONOSCOPY    ? ENDOMETRIAL ABLATION    ? LAPAROSCOPIC SALPINGOOPHERECTOMY    ? svd    ? x 1  ? TRIGGER FINGER RELEASE    ? bilateral  ? TUBAL LIGATION    ? UPPER GASTROINTESTINAL ENDOSCOPY    ? WISDOM TOOTH EXTRACTION    ? ?Social History:  reports that she has been smoking cigarettes. She has a 5.00 pack-year smoking history. She has never used smokeless tobacco. She reports current alcohol use of about 2.0 - 3.0 standard drinks per week. She reports that she does not use drugs. ? ?Allergies  ?Allergen Reactions  ? Tape Rash  ? ? ?Family History  ?Problem Relation Age of Onset  ? Colon polyps Father   ? Cancer Father   ?     ?  ? Diabetes Son   ? Diabetes Sister   ? ? ?Prior to Admission medications   ?Medication Sig Start Date End Date Taking? Authorizing Provider  ?atorvastatin (LIPITOR) 20 MG tablet Take 1 tablet by mouth daily. 09/03/21  Yes [provider]  ?[facility] ASPIRIN ADULT LOW DOSE 81 MG chewable tablet Chew 81 mg by mouth daily. 03/13/22  Yes [provider]  ?  famotidine (PEPCID) 20 MG tablet Take 20 mg by mouth 2 (two) times daily.   Yes [provider]  ?ibuprofen (ADVIL) 800 MG tablet Take 800 mg by mouth daily as needed for mild pain or moderate pain.   Yes [provider]  ?insulin aspart (NOVOLOG) 100 UNIT/ML injection Inject 0-100 Units into the skin continuous. Uses insulin pump   Yes [provider]  ?lisinopril (ZESTRIL) 2.5 MG tablet Take 2.5 mg by mouth daily. 12/05/21  Yes [provider]  ?HYDROcodone-acetaminophen (NORCO/VICODIN) 5-325 MG tablet Take 1 tablet by mouth every 6 (six) hours as needed for moderate pain or severe pain. ?Patient not taking: Reported on 03/14/2022 04/27/21 04/27/22  Nance Pear, MD  ? ? ?Physical Exam: ?Vitals:  ? 03/14/22 0843 03/14/22 0930  03/14/22 1000  ?BP: 114/76 (!) 152/78   ?Pulse: (!) 131 (!) 117   ?Resp: (!) 22 (!) 22   ?Temp: 97.8 ?F (36.6 ?C)    ?TempSrc: Oral    ?SpO2: 100% 100%   ?Weight:   80 kg  ? ?Constitutional: Middle-aged female who appears ill but able to follow command ?Eyes: PERRL, lids and conjunctivae normal ?ENMT: Mucous membranes are moist. Posterior pharynx clear of any exudate or lesions. ?Neck: normal, supple, no masses, no thyromegaly ?Respiratory: clear to auscultation bilaterally, no wheezing, no crackles. Normal respiratory effort. No accessory muscle use.  ?Cardiovascular: Regular rate and rhythm, no murmurs / rubs / gallops. No extremity edema. 2+ pedal pulses. No carotid bruits.  ?Abdomen: no tenderness, no masses palpated.  Bowel sounds positive.  ?Musculoskeletal: no clubbing / cyanosis. No joint deformity upper and lower extremities.  ?Skin: Tenderness noted of the right lower gluteal.  No significant induration appreciated or signs of drainage. ?Neurologic: CN 2-12 grossly intact. Strength 5/5 in all 4.  ?Psychiatric: Normal judgment and insight. Alert and oriented x 3. Normal mood.  ? ?Data Reviewed: ? ?Reviewed imaging and studies as noted above in HPI ? ?Assessment and Plan: ?* DKA, type 1 ([place]) ?On admission patient had presented with CO2 20, glucose 467, and anion gap of 24.  Last available hemoglobin A1c was 11 on 12/18/2020. ?-Admit to progressive unit ?-Check hemoglobin A1c ?-Hyperglycemia protocol initiated  ?-Serial BMPs, beta hydroxybutyrate hemoglobin A1c in a.m.  ?-Correct electrolytes as needed ?- Monitoring for AG closure and will transition to subcutaneous insulin once able ?- Social work and care management consult for need of PCP and barriers of ?- Diabetes education for possible need of ? ?SIRS (systemic inflammatory response syndrome) ([facility]) ?Patient was noted to be tachycardic and tachypneic.  Initial labs noted lactic acid 3.3 and WBC elevated at 23.4. ?-Check blood cultures ?-Trend lactic acid  level ?-Follow-up urinalysis ?-Check acute abdominal series ? ?AKI (acute kidney injury) ([place]) ?On admission creatinine 1.32 with BUN 18.  Baseline creatinine previously had been 0.5-0.8. With reports of nausea and vomiting suspect likely prerenal in nature.  Patient has been bolused IV fluids. ?-Continue IV fluids per hyperglycemia protocol  ?-Avoid nephrotoxic agents ?-Diflucan 200 mg IV due to possible yeast infection as reported per patient ?-Follow-up urinalysis ?-Continue to monitor kidney ? ?Essential hypertension ?Home medication regimen includes lisinopril 2.5 mg daily. ?-Hold lisinopril due to AKI ? ?GERD ?Patient had been given Pepcid IV x1 dose in the ED.  Home medication regimen includes Pepcid 20 mg p.o. twice daily. ?-Continue Pepcid  ? ?Tobacco use ?Patient reports only smoking 6 to 7 cigarettes/day on average.  She declined need of nicotine patch. ?-Counseled patient  on the cessation of tobacco. ? ? ? ? Advance Care Planning:   Code Status: Full Code  ? ?Consults: None ? ?Family Communication: patients mother updated at  ? ?Severity of Illness: ?The appropriate patient status for this patient is OBSERVATION. Observation status is judged to be reasonable and necessary in order to provide the required intensity of service to ensure the patient's safety. The patient's presenting symptoms, physical exam findings, and initial radiographic and laboratory data in the context of their medical condition is felt to place them at decreased risk for further clinical deterioration. Furthermore, it is anticipated that the patient will be medically stable for discharge from the hospital within 2 midnights of admission.  ? ?Author: ?Norval Morton, MD ?03/14/2022 11:03 AM ? ?For on call review [email or web address].  ?

## 2022-03-14 NOTE — Assessment & Plan Note (Signed)
Patient reports only smoking 6 to 7 cigarettes/day on average.  She declined need of nicotine patch. ?-Counseled patient on the cessation of tobacco. ?

## 2022-03-14 NOTE — ED Provider Notes (Addendum)
?[facility] ?Provider Note ? ? ?CSN: [id number] ?Arrival date & time: 03/14/22  7989 ? ?  ? ?History ? ?Chief Complaint  ?Patient presents with  ? Tachycardia  ? Vomiting  ? Shortness of Breath  ? ? ?Sheryl White is a 46 y.o. female. ? ?HPI ?Female with insulin-dependent diabetes presents with nausea, vomiting, generalized discomfort.  Onset seems to have been yesterday.  Some limitation secondary to the patient describing sore throat and writing her answers on a piece of paper. ?She seemingly was well prior to the onset of symptoms, since that time has been unable to stop vomiting, and her emesis has turned dark in the process. ?  ? ?Home Medications ?Prior to Admission medications   ?Medication Sig Start Date End Date Taking? Authorizing Provider  ?atorvastatin (LIPITOR) 20 MG tablet Take 1 tablet by mouth daily. 09/03/21  Yes [provider]  ?[facility] ASPIRIN ADULT LOW DOSE 81 MG chewable tablet Chew 81 mg by mouth daily. 03/13/22  Yes [provider]  ?famotidine (PEPCID) 20 MG tablet Take 20 mg by mouth 2 (two) times daily.   Yes [provider]  ?ibuprofen (ADVIL) 800 MG tablet Take 800 mg by mouth daily as needed for mild pain or moderate pain.   Yes [provider]  ?insulin aspart (NOVOLOG) 100 UNIT/ML injection Inject 0-100 Units into the skin continuous. Uses insulin pump   Yes [provider]  ?lisinopril (ZESTRIL) 2.5 MG tablet Take 2.5 mg by mouth daily. 12/05/21  Yes [provider]  ?HYDROcodone-acetaminophen (NORCO/VICODIN) 5-325 MG tablet Take 1 tablet by mouth every 6 (six) hours as needed for moderate pain or severe pain. ?Patient not taking: Reported on 03/14/2022 04/27/21 04/27/22  Nance Pear, MD  ?   ? ?Allergies    ?Tape   ? ?Review of Systems   ?Review of Systems  ?Unable to perform ROS: Acuity of condition  ? ?Physical Exam ?Updated Vital Signs ?BP (!) 152/78   Pulse (!) 117   Temp 97.8 ?F (36.6 ?C)  (Oral)   Resp (!) 22   Wt 80 kg   LMP 02/10/2012   SpO2 100%   BMI 28.47 kg/m?  ?Physical Exam ?Vitals and nursing note reviewed.  ?Constitutional:   ?   Appearance: She is well-developed. She is ill-appearing and diaphoretic.  ?HENT:  ?   Head: Normocephalic and atraumatic.  ?Eyes:  ?   Conjunctiva/sclera: Conjunctivae normal.  ?Cardiovascular:  ?   Rate and Rhythm: Normal rate and regular rhythm.  ?Pulmonary:  ?   Effort: Pulmonary effort is normal. Tachypnea present. No respiratory distress.  ?   Breath sounds: No stridor. Decreased breath sounds present.  ?Abdominal:  ?   General: There is no distension.  ?Skin: ?   General: Skin is warm.  ?Neurological:  ?   Mental Status: She is alert and oriented to person, place, and time.  ?   Cranial Nerves: No cranial nerve deficit.  ?Psychiatric:     ?   Mood and Affect: Mood normal.  ? ? ?ED Results / Procedures / Treatments   ?Labs ?(all labs ordered are listed, but only abnormal results are displayed) ?Labs Reviewed  ?COMPREHENSIVE METABOLIC PANEL - Abnormal; Notable for the following components:  ?    Result Value  ? Chloride 94 (*)   ? CO2 20 (*)   ? Glucose, Bld 467 (*)   ? Creatinine, Ser 1.32 (*)   ? Total Bilirubin 1.7 (*)   ?  GFR, Estimated 50 (*)   ? Anion gap 24 (*)   ? All other components within normal limits  ?LACTIC ACID, PLASMA - Abnormal; Notable for the following components:  ? Lactic Acid, Venous 3.3 (*)   ? All other components within normal limits  ?URINALYSIS, ROUTINE W REFLEX MICROSCOPIC - Abnormal; Notable for the following components:  ? Color, Urine STRAW (*)   ? Glucose, UA >=500 (*)   ? Ketones, ur 80 (*)   ? Protein, ur 30 (*)   ? All other components within normal limits  ?CBC WITH DIFFERENTIAL/PLATELET - Abnormal; Notable for the following components:  ? WBC 23.4 (*)   ? Hemoglobin 16.1 (*)   ? HCT 46.6 (*)   ? Neutro Abs 21.5 (*)   ? Lymphs Abs 0.5 (*)   ? Monocytes Absolute 1.2 (*)   ? Abs Immature Granulocytes 0.17 (*)   ? All  other components within normal limits  ?CBG MONITORING, ED - Abnormal; Notable for the following components:  ? Glucose-Capillary 433 (*)   ? All other components within normal limits  ?RESP PANEL BY RT-PCR (FLU A&B, COVID) ARPGX2  ?ETHANOL  ?LIPASE, BLOOD  ?LACTIC ACID, PLASMA  ?BASIC METABOLIC PANEL  ?BASIC METABOLIC PANEL  ?BASIC METABOLIC PANEL  ?BASIC METABOLIC PANEL  ?BETA-HYDROXYBUTYRIC ACID  ?BETA-HYDROXYBUTYRIC ACID  ?I-STAT BETA HCG BLOOD, ED (MC, [facility], AP ONLY)  ?I-STAT BETA HCG BLOOD, ED (MC, WL, AP ONLY)  ?I-STAT VENOUS BLOOD GAS, ED  ? ? ?EKG ?Computer air this does not cross over from our electronic system.  Patient has ECG with sinus tachycardia, rate 132, rightward axis, abnormal ? ? ?Radiology ?No results found. ? ?Procedures ?Procedures  ? ? ?Medications Ordered in ED ?Medications  ?insulin regular, human (MYXREDLIN) 100 units/ 100 mL infusion (8.5 Units/hr Intravenous New Bag/Given 03/14/22 1130)  ?lactated ringers infusion (has no administration in time range)  ?dextrose 5 % in lactated ringers infusion (has no administration in time range)  ?dextrose 50 % solution 0-50 mL (has no administration in time range)  ?potassium chloride 10 mEq in 100 mL IVPB (10 mEq Intravenous New Bag/Given 03/14/22 1132)  ?famotidine (PEPCID) tablet 20 mg (has no administration in time range)  ?atorvastatin (LIPITOR) tablet 20 mg (has no administration in time range)  ?sodium chloride 0.9 % bolus 1,000 mL (0 mLs Intravenous Stopped 03/14/22 1120)  ?ondansetron [facility]) injection 4 mg (4 mg Intravenous Given 03/14/22 0922)  ?famotidine (PEPCID) IVPB 20 mg premix (0 mg Intravenous Stopped 03/14/22 1045)  ?sucralfate (CARAFATE) 1 GM/10ML suspension 1 g (1 g Oral Given 03/14/22 0924)  ?lactated ringers bolus 1,600 mL (1,600 mLs Intravenous New Bag/Given 03/14/22 1131)  ?lidocaine (XYLOCAINE) 2 % viscous mouth solution 15 mL (15 mLs Mouth/Throat Given 03/14/22 1136)  ? ? ?ED Course/ Medical Decision Making/ A&P ?This patient with a Hx of  IDDM presents to the ED for concern of n/v, this involves an extensive number of treatment options, and is a complaint that carries with it a high risk of complications and morbidity.   ? ?The differential diagnosis includes DKA, nonketotic hyperosmolar state, GI illness, SIRS ? ? ?Social Determinants of Health: ? ?No limits ? ?Additional history obtained: ? ?Additional history and/or information obtained from mother at bedside, notable for scription of illness as well as 1 a sick family member, who has improved already ? ? ?After the initial evaluation, orders, including: Fluids, antiemetics were initiated. ? ? ?Patient placed on Cardiac and Pulse-Oximetry Monitors. ?The patient was  maintained on a cardiac monitor.  The cardiac monitored showed an rhythm of sinus tachycardia, 120 abnormal ?The patient was also maintained on pulse oximetry. The readings were typically 99% room air normal ? ? ?On repeat evaluation of the patient improved ? ?Lab Tests: ? ?I personally interpreted labs.  The pertinent results include: Hyperglycemia, anion gap acidosis concern for DKA ? ? ? ?Consultations Obtained: ? ?I requested consultation with the internal medicine,  and discussed lab and imaging findings as well as pertinent plan - they recommend: Admission ? ?Dispostion / Final MDM: ? ?After consideration of the diagnostic results and the patient's response to treatment, she has improved substantially, is no longer vomiting, but with concern for diabetic ketoacidosis, given tachycardia, tachypnea, persistent nausea, vomiting, hyperglycemia with anion gap patient was started on insulin drip, continuous, fluids, required admission for further monitoring, management. ? ?Final Clinical Impression(s) / ED Diagnoses ?Final diagnoses:  ?Diabetic ketoacidosis without coma associated with type 1 diabetes mellitus ([place])  ?CRITICAL CARE ?Performed by: Carmin Muskrat ?Total critical care time: 35 minutes ?Critical care time was exclusive of  separately billable procedures and treating other patients. ?Critical care was necessary to treat or prevent imminent or life-threatening deterioration. ?Critical care was time spent personally by me on the foll

## 2022-03-14 NOTE — Assessment & Plan Note (Signed)
Patient was noted to be tachycardic and tachypneic.  Initial labs noted lactic acid 3.3 and WBC elevated at 23.4. ?-Check blood cultures ?-Trend lactic acid level ?-Follow-up urinalysis ?-Check acute abdominal series ?

## 2022-03-14 NOTE — ED Notes (Signed)
IP RN stated to call back in 10 minutes. ?

## 2022-03-14 NOTE — Assessment & Plan Note (Signed)
Home medication regimen includes lisinopril 2.5 mg daily. ?-Hold lisinopril due to AKI ?

## 2022-03-14 NOTE — ED Notes (Signed)
Pt last three CBG readings from her monitor is 172/170/272 ?

## 2022-03-14 NOTE — ED Notes (Signed)
Pt is on 7 units. The pt states when her sugar is above 400 that is when she is on 7 units of insulin. Pt states she should be getting 3.5 insulin for a CBG 280. I informed her that it will  be monitored and adjusted. She feels she will bottom out. Notified Fuller Plan MD and IP RN Justice Rocher.  ?

## 2022-03-14 NOTE — Assessment & Plan Note (Signed)
Daughter notes patient has some mild memory loss and has difficult time remembering things. ?

## 2022-03-14 NOTE — ED Notes (Signed)
Patient transported to X-ray 

## 2022-03-15 DIAGNOSIS — E1010 Type 1 diabetes mellitus with ketoacidosis without coma: Secondary | ICD-10-CM | POA: Diagnosis not present

## 2022-03-15 LAB — GLUCOSE, CAPILLARY
Glucose-Capillary: 134 mg/dL — ABNORMAL HIGH (ref 70–99)
Glucose-Capillary: 157 mg/dL — ABNORMAL HIGH (ref 70–99)
Glucose-Capillary: 167 mg/dL — ABNORMAL HIGH (ref 70–99)
Glucose-Capillary: 181 mg/dL — ABNORMAL HIGH (ref 70–99)
Glucose-Capillary: 182 mg/dL — ABNORMAL HIGH (ref 70–99)
Glucose-Capillary: 191 mg/dL — ABNORMAL HIGH (ref 70–99)
Glucose-Capillary: 195 mg/dL — ABNORMAL HIGH (ref 70–99)
Glucose-Capillary: 196 mg/dL — ABNORMAL HIGH (ref 70–99)
Glucose-Capillary: 211 mg/dL — ABNORMAL HIGH (ref 70–99)
Glucose-Capillary: 230 mg/dL — ABNORMAL HIGH (ref 70–99)
Glucose-Capillary: 237 mg/dL — ABNORMAL HIGH (ref 70–99)
Glucose-Capillary: 274 mg/dL — ABNORMAL HIGH (ref 70–99)
Glucose-Capillary: 276 mg/dL — ABNORMAL HIGH (ref 70–99)
Glucose-Capillary: 330 mg/dL — ABNORMAL HIGH (ref 70–99)

## 2022-03-15 LAB — CBC
HCT: 38.5 % (ref 36.0–46.0)
Hemoglobin: 13.7 g/dL (ref 12.0–15.0)
MCH: 31.7 pg (ref 26.0–34.0)
MCHC: 35.6 g/dL (ref 30.0–36.0)
MCV: 89.1 fL (ref 80.0–100.0)
Platelets: 303 10*3/uL (ref 150–400)
RBC: 4.32 MIL/uL (ref 3.87–5.11)
RDW: 12 % (ref 11.5–15.5)
WBC: 16.5 10*3/uL — ABNORMAL HIGH (ref 4.0–10.5)
nRBC: 0 % (ref 0.0–0.2)

## 2022-03-15 LAB — BASIC METABOLIC PANEL
Anion gap: 8 (ref 5–15)
BUN: 18 mg/dL (ref 6–20)
CO2: 27 mmol/L (ref 22–32)
Calcium: 8.4 mg/dL — ABNORMAL LOW (ref 8.9–10.3)
Chloride: 102 mmol/L (ref 98–111)
Creatinine, Ser: 0.98 mg/dL (ref 0.44–1.00)
GFR, Estimated: >60 mL/min (ref >60)
Glucose, Bld: 217 mg/dL — ABNORMAL HIGH (ref 70–99)
Potassium: 3.4 mmol/L — ABNORMAL LOW (ref 3.5–5.1)
Sodium: 137 mmol/L (ref 135–145)

## 2022-03-15 LAB — BETA-HYDROXYBUTYRIC ACID: Beta-Hydroxybutyric Acid: 0.9 mmol/L — ABNORMAL HIGH (ref 0.05–0.27)

## 2022-03-15 MED ORDER — INSULIN PUMP
1.0000 | Freq: Three times a day (TID) | SUBCUTANEOUS | Status: AC
Start: 2022-03-15 — End: ?

## 2022-03-15 MED ORDER — INSULIN GLARGINE-YFGN 100 UNIT/ML ~~LOC~~ SOLN
5.0000 [IU] | Freq: Once | SUBCUTANEOUS | Status: AC
  Administered 2022-03-15: 5 [IU] via SUBCUTANEOUS
  Filled 2022-03-15: qty 0.05

## 2022-03-15 MED ORDER — INSULIN PUMP
Freq: Three times a day (TID) | SUBCUTANEOUS | Status: DC
  Filled 2022-03-15: qty 1

## 2022-03-15 NOTE — Discharge Summary (Incomplete Revision)
?Physician Discharge Summary ?  ?Patient: RICHARDINE PEPPERS MRN: [id number] DOB: 01/29/1976  ?Admit date:     03/14/2022  ?Discharge date: 03/15/22  ?Discharge Physician: Nita Sells  ? ?PCP: [place]  ? ?Recommendations at discharge:  ? ?Patient to follow-up with Dr. Honor Junes of [place] [facility] ?Patient to get Chem-12 CBC in about 1 week ? ?Discharge Diagnoses: ?Principal Problem: ?  DKA, type 1 ([place]) ?Active Problems: ?  AKI (acute kidney injury) ([place]) ?  GERD ?  Nausea & vomiting ?  Essential hypertension ?  SIRS (systemic inflammatory response syndrome) (H[facility]) ?  Tobacco use ?  Mild cognitive impairment with memory loss ? ?Resolved Problems: ?  * No resolved hospital problems. * ? ?Hospital Course: ?46 year old white female known DM Gastroparesis--follw with endocrinology ?Dyslipidemia depression GERD ?Presented to [facility] after 3 days of not feeling well + nausea + vomiting developed crampy abdominal pain?  Melena ? ? ?Assessment and Plan: ?Presented with DKA physiology anion gap elevated and this resolved relatively quickly ?She was able to speak to diabetic coordinator who instructed her to adjust her insulin pump which she has been using for the past year to year and a half with good success and was transitioned over to regular pump settings as per Dr. Honor Junes ?She had AKI from ATN from taking multiple meds which can affect kidneys [htn meds]--and also DKA this admission which resolved rapidly and was instructed to stop her ibuprofen ?She was insistent on leaving on 4/2 despite our recommendations to stay and ensure sugars were below 300 and it was confirmed that she had pump supplies and follow-up set up already as below per diabetic coordinator nursing ? ?Note thsat she was eating full meals prior to d/c home and no furthe rmention of n/v or "dark emesis"--no GI bleed ? ?Endocrinologist: Dr. [facility] ?Last seen 12/18/2020 ?Insulin Pump Settings at that visit were  as follows: ?Medtronic 700 series pump with NovoLog:  ?Basal rates ?MN = 1.4 ?8 AM = 1.5 ?TDD basal: 35.4 units ? ?Bolus settings ?I/C: 15 ?ISF: 50 ?Target Glucose: 110-120 ?Active insulin time: 4 hours ?Next appt with ENDO is 04/15/2022 ? ? ?  ? ? ?Consultants:  ?Procedures performed:   ?Disposition:  ?Diet recommendation:  ?Discharge Diet Orders (From admission, onward)  ? ?  Start     Ordered  ? 03/15/22 0000  Diet - low sodium heart healthy       ? 03/15/22 1322  ? ?  ?  ? ?  ? ?Cardiac and Carb modified diet ?DISCHARGE MEDICATION: ?Allergies as of 03/15/2022   ? ?   Reactions  ? Tape Rash  ? ?  ? ?  ?Medication List  ?  ? ?STOP taking these medications   ? ?HYDROcodone-acetaminophen 5-325 MG tablet ?Commonly known as: NORCO/VICODIN ?  ?ibuprofen 800 MG tablet ?Commonly known as: ADVIL ?  ? ?  ? ?TAKE these medications   ? ?atorvastatin 20 MG tablet ?Commonly known as: LIPITOR ?Take 1 tablet by mouth daily. ?  ?CVS Aspirin Adult Low Dose 81 MG chewable tablet ?Generic drug: aspirin ?Chew 81 mg by mouth daily. ?  ?famotidine 20 MG tablet ?Commonly known as: PEPCID ?Take 20 mg by mouth 2 (two) times daily. ?  ?insulin aspart 100 UNIT/ML injection ?Commonly known as: novoLOG ?Inject 0-100 Units into the skin continuous. Uses insulin pump ?  ?insulin pump Soln ?Inject 1 each into the skin 3 times daily with meals, bedtime and 2  AM. ?  ?lisinopril 2.5 MG tablet ?Commonly known as: ZESTRIL ?Take 2.5 mg by mouth daily. ?  ? ?  ? ? ?Discharge Exam: ?Filed Weights  ? 03/14/22 1000 03/14/22 2002  ?Weight: 80 kg 80 kg  ? ?Awake alert somewhat disheveled coherent tells me she wants to go ?Chest clear no rales rhonchi ?Multiple tattoos ?Poor dentition ?Looks older than stated age ?Neurologically intact no focal deficit ? ?Condition at discharge: fair ? ?The results of significant diagnostics from this hospitalization (including imaging, microbiology, ancillary and laboratory) are listed below for reference.  ? ?Imaging  Studies: ?DG ABD ACUTE 2+V W 1V CHEST ? ?Result Date: 03/14/2022 ?CLINICAL DATA:  Nausea and vomiting. EXAM: DG ABDOMEN ACUTE WITH 1 VIEW CHEST COMPARISON:  None. FINDINGS: The lungs are clear without focal pneumonia, edema, pneumothorax or pleural effusion. The cardiopericardial silhouette is within normal limits for size. Fixation hardware noted right clavicle. Telemetry leads overlie the chest. Upright film shows no evidence for intraperitoneal free air. There is no evidence for gaseous bowel dilation to suggest obstruction. IMPRESSION: Negative abdominal radiographs.  No acute cardiopulmonary disease. Electronically Signed   By: Misty Stanley M.D.   On: 03/14/2022 12:18   ? ?Microbiology: ?Results for orders placed or performed during the hospital encounter of 03/14/22  ?Resp Panel by RT-PCR (Flu A&B, Covid) Nasopharyngeal Swab     Status: None  ? Collection Time: 03/14/22 12:35 PM  ? Specimen: Nasopharyngeal Swab; Nasopharyngeal(NP) swabs in vial transport medium  ?Result Value Ref Range Status  ? SARS Coronavirus 2 by RT PCR NEGATIVE NEGATIVE Final  ?  Comment: (NOTE) ?SARS-CoV-2 target nucleic acids are NOT DETECTED. ? ?The SARS-CoV-2 RNA is generally detectable in upper respiratory ?specimens during the acute phase of infection. The lowest ?concentration of SARS-CoV-2 viral copies this assay can detect is ?138 copies/mL. A negative result does not preclude SARS-Cov-2 ?infection and should not be used as the sole basis for treatment or ?other patient management decisions. A negative result may occur with  ?improper specimen collection/handling, submission of specimen other ?than nasopharyngeal swab, presence of viral mutation(s) within the ?areas targeted by this assay, and inadequate number of viral ?copies(<138 copies/mL). A negative result must be combined with ?clinical observations, patient history, and epidemiological ?information. The expected result is Negative. ? ?Fact Sheet for Patients:   ?EntrepreneurPulse.com.au ? ?Fact Sheet for Healthcare Providers:  ?IncredibleEmployment.be ? ?This test is no t yet approved or cleared by the [place] FDA and  ?has been authorized for detection and/or diagnosis of SARS-CoV-2 by ?FDA under an Emergency Use Authorization (EUA). This EUA will remain  ?in effect (meaning this test can be used) for the duration of the ?COVID-19 declaration under Section 564(b)(1) of the Act, 21 ?U.S.C.section 360bbb-3(b)(1), unless the authorization is terminated  ?or revoked sooner.  ? ? ?  ? Influenza A by PCR NEGATIVE NEGATIVE Final  ? Influenza B by PCR NEGATIVE NEGATIVE Final  ?  Comment: (NOTE) ?The Xpert Xpress SARS-CoV-2/FLU/RSV plus assay is intended as an aid ?in the diagnosis of influenza from Nasopharyngeal swab specimens and ?should not be used as a sole basis for treatment. Nasal washings and ?aspirates are unacceptable for Xpert Xpress SARS-CoV-2/FLU/RSV ?testing. ? ?Fact Sheet for Patients: ?EntrepreneurPulse.com.au ? ?Fact Sheet for Healthcare Providers: ?IncredibleEmployment.be ? ?This test is not yet approved or cleared by the [place] FDA and ?has been authorized for detection and/or diagnosis of SARS-CoV-2 by ?FDA under an Emergency Use Authorization (EUA). This EUA will remain ?in  effect (meaning this test can be used) for the duration of the ?COVID-19 declaration under Section 564(b)(1) of the Act, 21 U.S.C. ?section 360bbb-3(b)(1), unless the authorization is terminated or ?revoked. ? ?Performed at [facility], [place] [address], [place], [place] ?[address] ?  ?Culture, blood (routine x 2)     Status: None (Preliminary result)  ? Collection Time: 03/14/22  1:46 PM  ? Specimen: Left Antecubital; Blood  ?Result Value Ref Range Status  ? Specimen Description LEFT ANTECUBITAL  Final  ? Special Requests   Final  ?  BOTTLES DRAWN AEROBIC AND ANAEROBIC Blood Culture adequate volume  ?  Culture   Final  ?  NO GROWTH < 24 HOURS ?Performed at [facility], [place] [address], [place], [place] [address] ?  ? Report Status PENDING  Incomplete  ?Culture, blood (routine x 2)     Status: None (Preliminary re

## 2022-03-15 NOTE — Discharge Summary (Addendum)
?Physician Discharge Summary ?  ?Patient: Sheryl White MRN: [id number] DOB: [birth year]  ?Admit date:     03/14/2022  ?Discharge date: 03/15/22  ?Discharge Physician: Nita Sells  ? ?PCP: [place], [place]  ? ?Recommendations at discharge:  ? ?Patient to follow-up with Dr. Honor Junes of [place] [facility] ?Patient to get Chem-12 CBC in about 1 week ? ?Discharge Diagnoses: ?Principal Problem: ?  DKA, type 1 ([place]) ?Active Problems: ?  AKI (acute kidney injury) ([place]) ?  GERD ?  Nausea & vomiting ?  Essential hypertension ?  SIRS (systemic inflammatory response syndrome) (HCC) ?  Tobacco use ?  Mild cognitive impairment with memory loss ? ?Resolved Problems: ?  * No resolved hospital problems. * ? ?Hospital Course: ?46 year old white female known DM Gastroparesis--follw with endocrinology ?Dyslipidemia depression GERD ?Presented to [facility] after 3 days of not feeling well + nausea + vomiting developed crampy abdominal pain?  Melena ? ? ?Assessment and Plan: ?Presented with DKA physiology anion gap elevated and this resolved relatively quickly ?She was able to speak to diabetic coordinator who instructed her to adjust her insulin pump which she has been using for the past year to year and a half with good success and was transitioned over to regular pump settings as per Dr. Honor Junes ?She had AKI from ATN from taking multiple meds which can affect kidneys [htn meds]--and also DKA this admission which resolved rapidly and was instructed to stop her ibuprofen ?She was insistent on leaving on 4/2 despite our recommendations to stay and ensure sugars were below 300 and it was confirmed that she had pump supplies and follow-up set up already as below per diabetic coordinator nursing ? ?Note thsat she was eating full meals prior to d/c home and no furthe rmention of n/v or "dark emesis"--no GI bleed ? ?Endocrinologist: Dr. [facility]es with [facility] ?Last seen 12/18/2020 ?Insulin Pump Settings at that visit were  as follows: ?Medtronic 700 series pump with NovoLog:  ?Basal rates ?MN = 1.4 ?8 AM = 1.5 ?TDD basal: 35.4 units ? ?Bolus settings ?I/C: 15 ?ISF: 50 ?Target Glucose: 110-120 ?Active insulin time: 4 hours ?Next appt with ENDO is 04/15/2022 ? ? ?  ? ? ?Consultants:  ?Procedures performed:   ?Disposition:  ?Diet recommendation:  ?Discharge Diet Orders (From admission, onward)  ? ?  Start     Ordered  ? 03/15/22 0000  Diet - low sodium heart healthy       ? 03/15/22 1322  ? ?  ?  ? ?  ? ?Cardiac and Carb modified diet ?DISCHARGE MEDICATION: ?Allergies as of 03/15/2022   ? ?   Reactions  ? Tape Rash  ? ?  ? ?  ?Medication List  ?  ? ?STOP taking these medications   ? ?HYDROcodone-acetaminophen 5-325 MG tablet ?Commonly known as: NORCO/VICODIN ?  ?ibuprofen 800 MG tablet ?Commonly known as: ADVIL ?  ? ?  ? ?TAKE these medications   ? ?atorvastatin 20 MG tablet ?Commonly known as: LIPITOR ?Take 1 tablet by mouth daily. ?  ?CVS Aspirin Adult Low Dose 81 MG chewable tablet ?Generic drug: aspirin ?Chew 81 mg by mouth daily. ?  ?famotidine 20 MG tablet ?Commonly known as: PEPCID ?Take 20 mg by mouth 2 (two) times daily. ?  ?insulin aspart 100 UNIT/ML injection ?Commonly known as: novoLOG ?Inject 0-100 Units into the skin continuous. Uses insulin pump ?  ?insulin pump Soln ?Inject 1 each into the skin 3 times daily with meals, bedtime and 2  AM. ?  ?lisinopril 2.5 MG tablet ?Commonly known as: ZESTRIL ?Take 2.5 mg by mouth daily. ?  ? ?  ? ? ?Discharge Exam: ?Filed Weights  ? 03/14/22 1000 03/14/22 2002  ?Weight: 80 kg 80 kg  ? ?Awake alert somewhat disheveled coherent tells me she wants to go ?Chest clear no rales rhonchi ?Multiple tattoos ?Poor dentition ?Looks older than stated age ?Neurologically intact no focal deficit ? ?Condition at discharge: fair ? ?The results of significant diagnostics from this hospitalization (including imaging, microbiology, ancillary and laboratory) are listed below for reference.  ? ?Imaging  Studies: ?DG ABD ACUTE 2+V W 1V CHEST ? ?Result Date: 03/14/2022 ?CLINICAL DATA:  Nausea and vomiting. EXAM: DG ABDOMEN ACUTE WITH 1 VIEW CHEST COMPARISON:  None. FINDINGS: The lungs are clear without focal pneumonia, edema, pneumothorax or pleural effusion. The cardiopericardial silhouette is within normal limits for size. Fixation hardware noted right clavicle. Telemetry leads overlie the chest. Upright film shows no evidence for intraperitoneal free air. There is no evidence for gaseous bowel dilation to suggest obstruction. IMPRESSION: Negative abdominal radiographs.  No acute cardiopulmonary disease. Electronically Signed   By: Misty Stanley M.D.   On: 03/14/2022 12:18   ? ?Microbiology: ?Results for orders placed or performed during the hospital encounter of 03/14/22  ?Resp Panel by RT-PCR (Flu A&B, Covid) Nasopharyngeal Swab     Status: None  ? Collection Time: 03/14/22 12:35 PM  ? Specimen: Nasopharyngeal Swab; Nasopharyngeal(NP) swabs in vial transport medium  ?Result Value Ref Range Status  ? SARS Coronavirus 2 by RT PCR NEGATIVE NEGATIVE Final  ?  Comment: (NOTE) ?SARS-CoV-2 target nucleic acids are NOT DETECTED. ? ?The SARS-CoV-2 RNA is generally detectable in upper respiratory ?specimens during the acute phase of infection. The lowest ?concentration of SARS-CoV-2 viral copies this assay can detect is ?138 copies/mL. A negative result does not preclude SARS-Cov-2 ?infection and should not be used as the sole basis for treatment or ?other patient management decisions. A negative result may occur with  ?improper specimen collection/handling, submission of specimen other ?than nasopharyngeal swab, presence of viral mutation(s) within the ?areas targeted by this assay, and inadequate number of viral ?copies(<138 copies/mL). A negative result must be combined with ?clinical observations, patient history, and epidemiological ?information. The expected result is Negative. ? ?Fact Sheet for Patients:   ?EntrepreneurPulse.com.au ? ?Fact Sheet for Healthcare Providers:  ?IncredibleEmployment.be ? ?This test is no t yet approved or cleared by the [place] FDA and  ?has been authorized for detection and/or diagnosis of SARS-CoV-2 by ?FDA under an Emergency Use Authorization (EUA). This EUA will remain  ?in effect (meaning this test can be used) for the duration of the ?COVID-19 declaration under Section 564(b)(1) of the Act, 21 ?U.S.C.section 360bbb-3(b)(1), unless the authorization is terminated  ?or revoked sooner.  ? ? ?  ? Influenza A by PCR NEGATIVE NEGATIVE Final  ? Influenza B by PCR NEGATIVE NEGATIVE Final  ?  Comment: (NOTE) ?The Xpert Xpress SARS-CoV-2/FLU/RSV plus assay is intended as an aid ?in the diagnosis of influenza from Nasopharyngeal swab specimens and ?should not be used as a sole basis for treatment. Nasal washings and ?aspirates are unacceptable for Xpert Xpress SARS-CoV-2/FLU/RSV ?testing. ? ?Fact Sheet for Patients: ?EntrepreneurPulse.com.au ? ?Fact Sheet for Healthcare Providers: ?IncredibleEmployment.be ? ?This test is not yet approved or cleared by the [place] FDA and ?has been authorized for detection and/or diagnosis of SARS-CoV-2 by ?FDA under an Emergency Use Authorization (EUA). This EUA will remain ?in  effect (meaning this test can be used) for the duration of the ?COVID-19 declaration under Section 564(b)(1) of the Act, 21 U.S.C. ?section 360bbb-3(b)(1), unless the authorization is terminated or ?revoked. ? ?Performed at [facility], [place] [address], [place], [place] ?[address] ?  ?Culture, blood (routine x 2)     Status: None (Preliminary result)  ? Collection Time: 03/14/22  1:46 PM  ? Specimen: Left Antecubital; Blood  ?Result Value Ref Range Status  ? Specimen Description LEFT ANTECUBITAL  Final  ? Special Requests   Final  ?  BOTTLES DRAWN AEROBIC AND ANAEROBIC Blood Culture adequate volume  ?  Culture   Final  ?  NO GROWTH < 24 HOURS ?Performed at [facility], [place] [address], [place], [place] [address] ?  ? Report Status PENDING  Incomplete  ?Culture, blood (routine x 2)     Status: None (Preliminary re

## 2022-03-15 NOTE — Progress Notes (Signed)
?  Transition of Care (TOC) Screening Note ? ? ?Patient Details  ?Name: Sheryl White ?Date of Birth: [birth year] ? ? ?Transition of Care (TOC) CM/SW Contact:    ?Bartholomew Crews, RN ?Phone Number: [phone] ?03/15/2022, 8:32 AM ? ? ? ?Transition of Care Department [facility]) has reviewed patient and no TOC needs have been identified at this time. We will continue to monitor patient advancement through interdisciplinary progression rounds. If new patient transition needs arise, please place a TOC consult. ? ? ?

## 2022-03-15 NOTE — Progress Notes (Signed)
Inpatient Diabetes Program Recommendations ? ?AACE/ADA: New Consensus Statement on Inpatient Glycemic Control (2015) ? ?Target Ranges:  Prepandial:   less than 140 mg/dL ?     Peak postprandial:   less than 180 mg/dL (1-2 hours) ?     Critically ill patients:  140 - 180 mg/dL  ? ? Latest Reference Range & Units 03/15/22 02:01 03/15/22 03:04 03/15/22 04:00 03/15/22 04:54 03/15/22 05:58 03/15/22 06:58 03/15/22 07:51 03/15/22 08:50 03/15/22 10:15  ?Glucose-Capillary 70 - 99 mg/dL 196 (H) 195 (H) 157 (H) 182 (H) 134 (H) 191 (H) 276 (H) 167 (H) 274 (H) ? ?5 units Semglee given '@1108'$   ?(H): Data is abnormally high ? ? ? ?Admit with: DKA ?  ?History: Type 1 diabetes ?  ?Home DM Meds: Insulin Pump with Novolog ?  ?Current Orders: IV Insulin Drip ?    Transitioning to Home Insulin Pump ?  ?  ?  ?Called RN Nadine this AM to review events of the AM.  Nadine told me she gave pt 5 units Semglee at 11:08am today and plans to d/c the IV Insulin Drip at 1pm today.  Discussed with RN that when we have pts resume their home insulin pumps that we usually do NOT give basal insulin and instead have the pt resume the basal rates on their pump and then d/c the IV Insulin Drip 2 hours after the pump was restarted.  Given pt only received 5 units Semglee this AM and her total basal rate on her pump is 35.4 units, pt should resume the basal rates on her pump now (waiting until this afternoon could cause her to have hyperglycemia).  IV Insulin Drip should be stopped by 1pm this afternoon at the latest.  Spoke with pt by phone.  Pt told me Dr. Verlon Au told her to resume her insulin pump.  Discussed all of the above info with pt and asked to resume her pump with all new supplies.  Pt stated she does not have new/fresh supplies, but can restart her pump now and then have family bring new supplies early this afternoon and she can change out the reservoir/ insertion site/tubing/ etc this afternoon.  Discussed with pt the importance of using new  pump supplies when admitted with DKA.  Pt agreeable.  Re-spoke w/ RN and reviewed the above.  RN to check CBG at 12pm today and adjust the IV Insulin drip rate per Endotool.  Discussed with RN that drip should be off by 1pm at the latest today.  Reminded RN that RN will still need ot check pt's CBGs with hospital meter and pt to notify RN when she gives herself any insulin off her own insulin pump.  Will check back after 12pm to check on latest CBG. ? ? ? ?Endocrinologist: Dr. [facility]es [facility] ?Last seen 12/18/2020 ?Insulin Pump Settings at that visit were as follows: ?Medtronic 700 series pump with NovoLog:  ?Basal rates ?MN = 1.4 ?8 AM = 1.5 ?TDD basal: 35.4 units ? ?Bolus settings ?I/C: 15 ?ISF: 50 ?Target Glucose: 110-120 ?Active insulin time: 4 hours ?Next appt with ENDO is 04/15/2022 ?  ?--Will follow patient during hospitalization-- ?  ?Wyn Quaker RN, MSN, CDE ?Diabetes Coordinator ?Inpatient Glycemic Control Team ?Team Pager: [phone] (8a-5p) ?  ?

## 2022-03-19 LAB — CULTURE, BLOOD (ROUTINE X 2)
Culture: NO GROWTH
Culture: NO GROWTH
Special Requests: ADEQUATE

## 2022-05-13 IMAGING — DX DG FOOT COMPLETE 3+V*R*
3 series · 3 of 3 positions shown · non-contrast
Comparison: None.

CLINICAL DATA: Right foot pain, trauma to dorsum of foot.

EXAM:
RIGHT FOOT COMPLETE - 3+ VIEW

[foot ap]
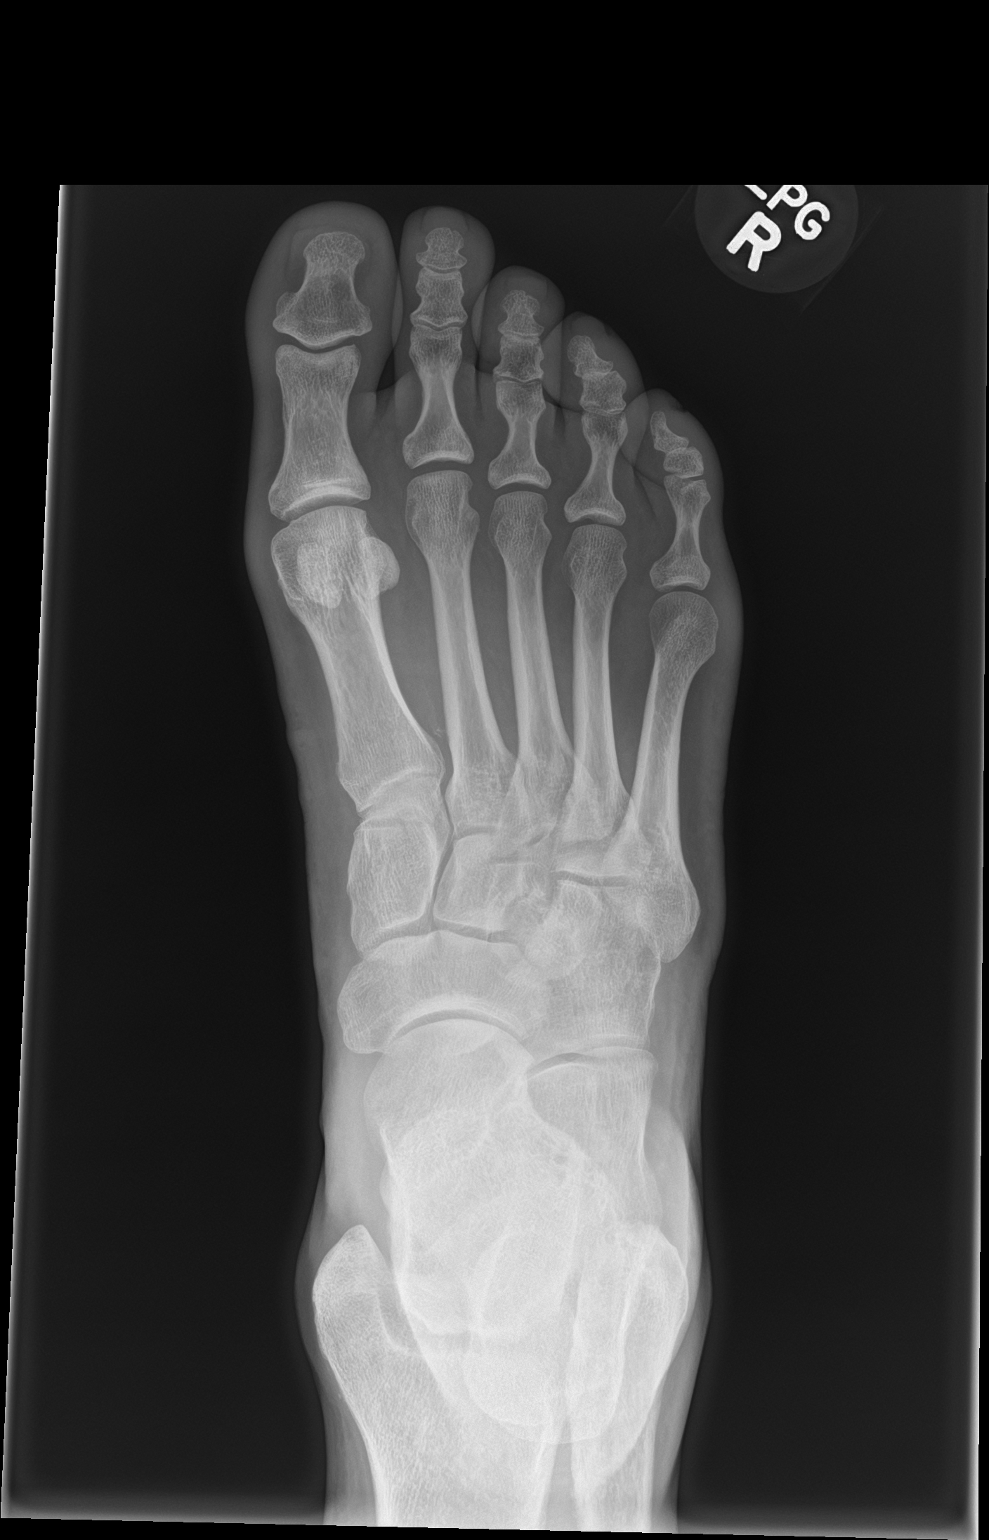

[foot obl]
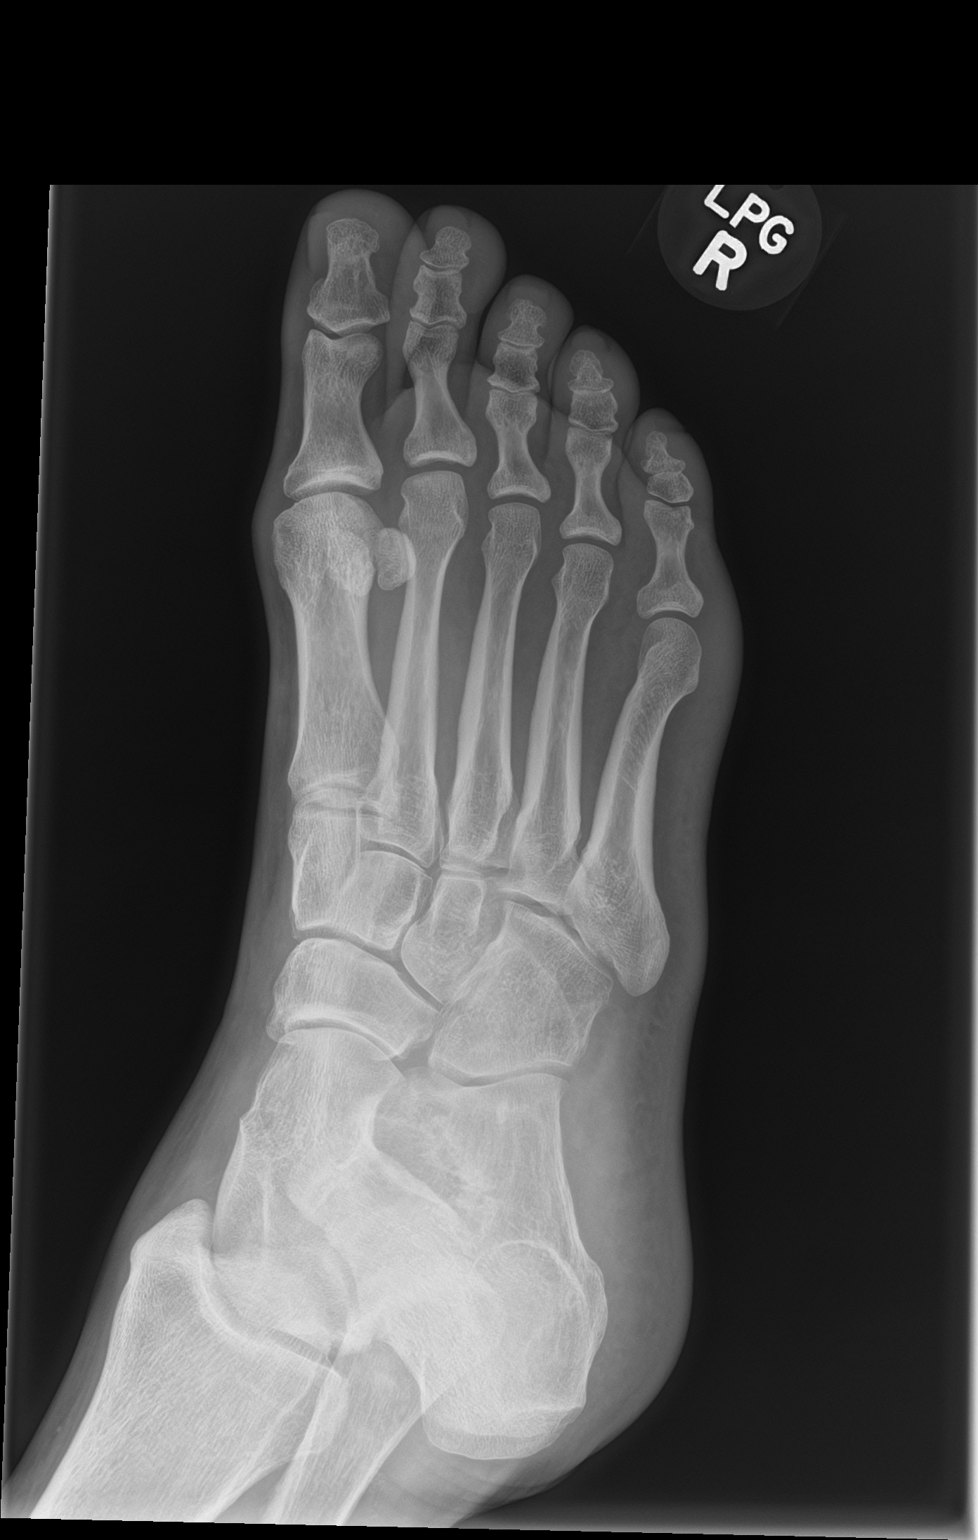

[foot lat]
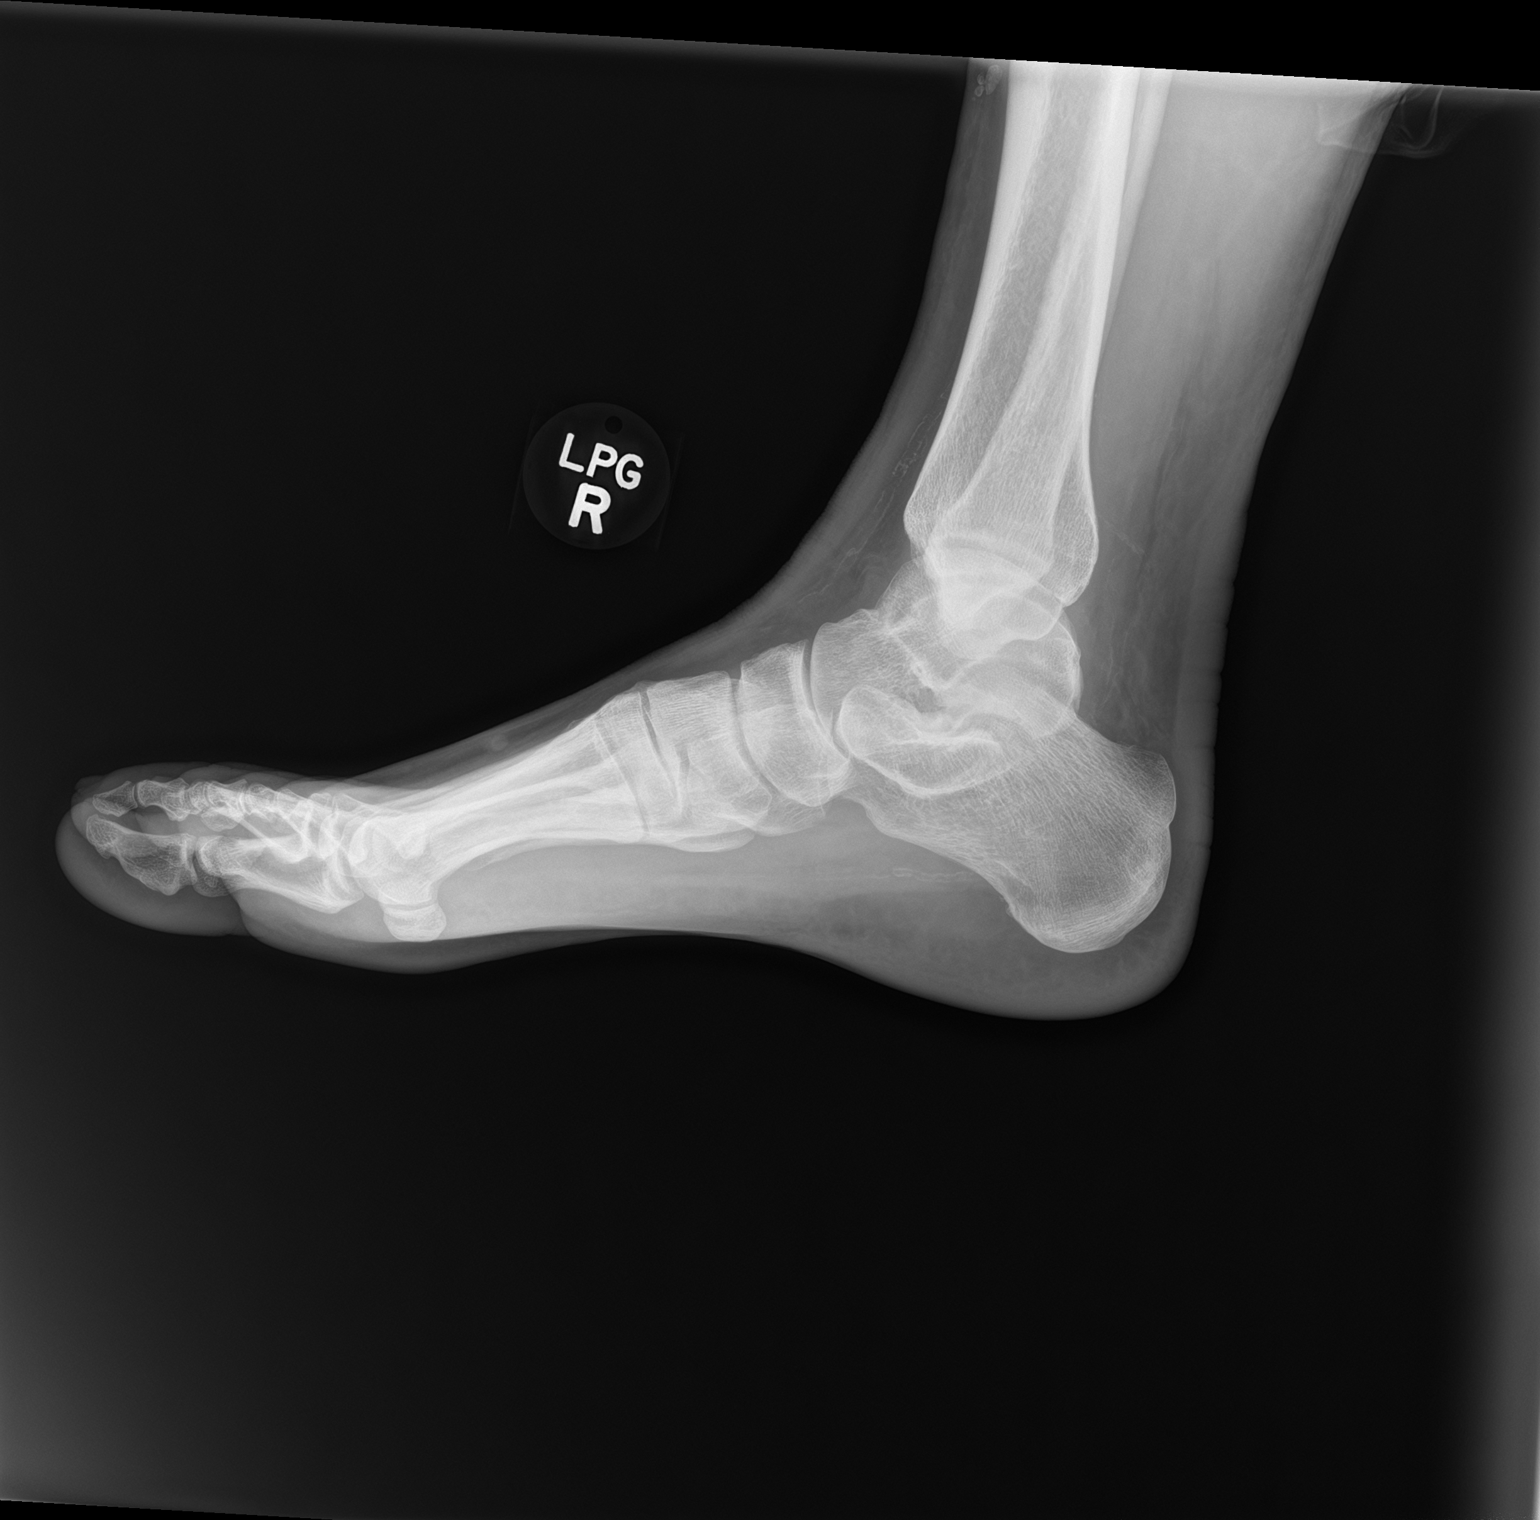

[3 of 3 positions shown; findings below may reference images not displayed]

FINDINGS: There is no evidence of fracture or dislocation. There is no
evidence of arthropathy or other focal bone abnormality. Peripheral
vascular calcifications are present. Soft tissues are otherwise
unremarkable.
IMPRESSION: Negative.

## 2022-12-02 DIAGNOSIS — N952 Postmenopausal atrophic vaginitis: Secondary | ICD-10-CM | POA: Insufficient documentation

## 2022-12-03 DIAGNOSIS — N760 Acute vaginitis: Secondary | ICD-10-CM | POA: Insufficient documentation

## 2022-12-03 DIAGNOSIS — B3731 Acute candidiasis of vulva and vagina: Secondary | ICD-10-CM | POA: Insufficient documentation

## 2022-12-03 DIAGNOSIS — B9689 Other specified bacterial agents as the cause of diseases classified elsewhere: Secondary | ICD-10-CM | POA: Insufficient documentation

## 2023-02-25 LAB — EXTERNAL GENERIC LAB PROCEDURE: COLOGUARD: NEGATIVE

## 2023-02-25 LAB — COLOGUARD: COLOGUARD: NEGATIVE

## 2023-04-13 DIAGNOSIS — N393 Stress incontinence (female) (male): Secondary | ICD-10-CM | POA: Insufficient documentation

## 2023-10-11 ENCOUNTER — Other Ambulatory Visit: Payer: Self-pay

## 2023-10-11 NOTE — Progress Notes (Deleted)
Sheryl Amy, PA-C [address]  [address]  [place], [place] [address]  Main: [phone]  Fax: [phone]   Gastroenterology Consultation  Referring Provider:     [facility] Primary Care Physician:  [facility] Primary Gastroenterologist:  *** Reason for Consultation:     GERD        HPI:   Sheryl White is a 47 y.o. y/o female referred for consultation & management  by [facility].  ***  Past Medical History:  Diagnosis Date   Angina    Anxiety    Depression    Diabetes mellitus    Dyslipidemia    Endometriosis    Essential hypertension 03/14/2022   GERD (gastroesophageal reflux disease)    Ovarian cyst     Past Surgical History:  Procedure Laterality Date   ABDOMINAL HYSTERECTOMY     still have left ovary   CARPAL TUNNEL RELEASE     bilateral   CESAREAN SECTION     x 1   COLONOSCOPY     ENDOMETRIAL ABLATION     LAPAROSCOPIC SALPINGOOPHERECTOMY     svd     x 1   TRIGGER FINGER RELEASE     bilateral   TUBAL LIGATION     UPPER GASTROINTESTINAL ENDOSCOPY     WISDOM TOOTH EXTRACTION      Prior to Admission medications   Medication Sig Start Date End Date Taking? Authorizing Provider  atorvastatin (LIPITOR) 20 MG tablet Take 1 tablet by mouth daily. 09/03/21   [provider]  CVS ASPIRIN ADULT LOW DOSE 81 MG chewable tablet Chew 81 mg by mouth daily. 03/13/22   [provider]  famotidine (PEPCID) 20 MG tablet Take 20 mg by mouth 2 (two) times daily.    [provider]  insulin aspart (NOVOLOG) 100 UNIT/ML injection Inject 0-100 Units into the skin continuous. Uses insulin pump    [provider]  Insulin Human (INSULIN PUMP) SOLN Inject 1 each into the skin 3 times daily with meals, bedtime and 2 AM. 03/15/22   Rhetta Mura, MD  lisinopril (ZESTRIL) 2.5 MG tablet Take 2.5 mg by mouth daily. 12/05/21   [provider]    Family History  Problem Relation Age of Onset   Colon  polyps Father    Cancer Father        ?   Diabetes Son    Diabetes Sister      Social History   Tobacco Use   Smoking status: Every Day    Current packs/day: 1.00    Average packs/day: 1 pack/day for 5.0 years (5.0 ttl pk-yrs)    Types: Cigarettes   Smokeless tobacco: Never  Substance Use Topics   Alcohol use: Yes    Alcohol/week: 2.0 - 3.0 standard drinks of alcohol    Types: 2 - 3 Standard drinks or equivalent per week    Comment: occasionally   Drug use: No    Allergies as of 10/12/2023 - Review Complete 03/14/2022  Allergen Reaction Noted   Tape Rash 03/14/2022    Review of Systems:    All systems reviewed and negative except where noted in HPI.   Physical Exam:  LMP 02/10/2012  Patient's last menstrual period was 02/10/2012.  General:   Alert,  Well-developed, well-nourished, pleasant and cooperative in NAD Lungs:  Respirations even and unlabored.  Clear throughout to auscultation.   No wheezes, crackles, or rhonchi. No acute distress. Heart:  Regular rate and  rhythm; no murmurs, clicks, rubs, or gallops. Abdomen:  Normal bowel sounds.  No bruits.  Soft, and non-distended without masses, hepatosplenomegaly or hernias noted.  No Tenderness.  No guarding or rebound tenderness.    Neurologic:  Alert and oriented x3;  grossly normal neurologically. Psych:  Alert and cooperative. Normal mood and affect.  Imaging Studies: No results found.  Assessment and Plan:   Sheryl White is a 47 y.o. y/o female has been referred for ***  Follow up ***  Sheryl Amy, PA-C    BP check ***

## 2023-10-12 ENCOUNTER — Ambulatory Visit: Payer: BC Managed Care – PPO | Admitting: Physician Assistant

## 2023-11-10 ENCOUNTER — Other Ambulatory Visit: Payer: Self-pay

## 2023-11-16 ENCOUNTER — Ambulatory Visit: Payer: BC Managed Care – PPO | Admitting: Physician Assistant

## 2023-11-16 ENCOUNTER — Encounter: Payer: Self-pay | Admitting: Physician Assistant

## 2023-11-16 ENCOUNTER — Other Ambulatory Visit: Payer: Self-pay

## 2023-11-16 VITALS — BP 147/83 | HR 86 | Temp 97.6°F | Ht 66.0 in | Wt 205.5 lb

## 2023-11-16 DIAGNOSIS — K219 Gastro-esophageal reflux disease without esophagitis: Secondary | ICD-10-CM | POA: Diagnosis not present

## 2023-11-16 DIAGNOSIS — Z8719 Personal history of other diseases of the digestive system: Secondary | ICD-10-CM | POA: Diagnosis not present

## 2023-11-16 DIAGNOSIS — Z1211 Encounter for screening for malignant neoplasm of colon: Secondary | ICD-10-CM

## 2023-11-16 MED ORDER — NA SULFATE-K SULFATE-MG SULF 17.5-3.13-1.6 GM/177ML PO SOLN: 354.0000 mL | Freq: Once | ORAL | 0 refills | Status: AC

## 2023-11-16 MED ORDER — FAMOTIDINE 40 MG PO TABS: 40.0000 mg | ORAL_TABLET | Freq: Every day | ORAL | 3 refills | Status: DC

## 2023-11-16 MED ORDER — PANTOPRAZOLE SODIUM 40 MG PO TBEC: 40.0000 mg | DELAYED_RELEASE_TABLET | Freq: Every day | ORAL | 3 refills | Status: DC

## 2023-11-16 NOTE — Progress Notes (Unsigned)
Sheryl Amy, PA-C [address]  [address]  [place], [place] [address]  Main: [phone]  Fax: [phone]   Gastroenterology Consultation  Referring Provider:     [facility] Primary Care Physician:  [facility] [place] Primary Gastroenterologist:  Sheryl Amy, PA-C / Dr. Midge Minium   Reason for Consultation:     GERD, schedule screening colonoscopy        HPI:   Sheryl White is a 47 y.o. y/o female referred for consultation & management  by [facility].    Here to evaluate GERD and discuss scheduling screening colonoscopy due to age.  Long history of acid reflux for 20 years.  Currently takes pantoprazole 40 Mg daily which is not controlling her reflux.  She reports painful belching with random episodes of heartburn.  Not every day.  She has not noticed any specific food triggers.  Denies dysphagia or weight loss.  She has mild occasional constipation and no current treatment.  Denies rectal bleeding or family history of GI malignancy.  06/2008 EGD: By Dr. Russella Dar at [facility] GI: 3 cm hiatal hernia, mild esophagitis from GERD  06/2008 colonoscopy: By Dr. Russella Dar at [facility] GI: Normal.  No polyps.  Good prep.  Past Medical History:  Diagnosis Date   Angina    Anxiety    Depression    Diabetes mellitus    Dyslipidemia    Endometriosis    Essential hypertension 03/14/2022   GERD (gastroesophageal reflux disease)    Ovarian cyst     Past Surgical History:  Procedure Laterality Date   ABDOMINAL HYSTERECTOMY     still have left ovary   CARPAL TUNNEL RELEASE     bilateral   CESAREAN SECTION     x 1   COLONOSCOPY     ENDOMETRIAL ABLATION     LAPAROSCOPIC SALPINGOOPHERECTOMY     svd     x 1   TRIGGER FINGER RELEASE     bilateral   TUBAL LIGATION     UPPER GASTROINTESTINAL ENDOSCOPY     WISDOM TOOTH EXTRACTION      Prior to Admission medications   Medication Sig Start Date End Date Taking? Authorizing Provider  atorvastatin (LIPITOR) 20 MG  tablet Take 1 tablet by mouth daily.    [provider]  Cholecalciferol (VITAMIN D-1000 MAX ST) 25 MCG (1000 UT) tablet Take by mouth.    [provider]  Cholecalciferol 1.25 MG (50000 UT) capsule Take 1 capsule by mouth once a week.    [provider]  Continuous Glucose Sensor (DEXCOM G7 SENSOR) MISC Use 1 each every 10 (ten) days 08/09/23   [provider]  Continuous Glucose Transmitter (DEXCOM G6 TRANSMITTER) MISC USE TO MONITOR BLOOD SUGAR. REPLACE EVERY 3 MONTHS 09/24/23   [provider]  [facility] ASPIRIN ADULT LOW DOSE 81 MG chewable tablet Chew 81 mg by mouth daily. 03/13/22   [provider]  empagliflozin (JARDIANCE) 10 MG TABS tablet TAKE 1 TABLET BY MOUTH EVERY DAY IN THE MORNING    [provider]  estradiol (ESTRACE) 0.1 MG/GM vaginal cream Place 1 Applicatorful vaginally once a week.    [provider]  .  Insulin Disposable Pump (OMNIPOD 5 DEXG7G6 PODS GEN 5) MISC INJECT 1 POD SUBCUTANEOUSLY EVERY OTHER DAY    [provider]  Insulin Disposable Pump (OMNIPOD 5 DEXG7G6 PODS GEN 5) MISC Inject into the skin. 06/02/23   [provider]  Insulin Glargine [facility])  100 UNIT/ML  09/16/22   [provider]  Insulin Human (INSULIN PUMP) SOLN Inject 1 each into the skin 3 times daily with meals, bedtime and 2 AM. 03/15/22   Rhetta Mura, MD  lisinopril (ZESTRIL) 5 MG tablet Take 1 tablet by mouth daily.    [provider]  NOVOLOG 100 UNIT/ML injection SMARTSIG:0-100 Unit(s) SUB-Q Daily 09/09/23   [provider]  pantoprazole (PROTONIX) 40 MG tablet Take 1 tablet by mouth daily. 08/15/23   [provider]    Family History  Problem Relation Age of Onset   Colon polyps Father    Cancer Father        ?   Diabetes Son    Diabetes Sister      Social History   Tobacco Use   Smoking status: Every Day    Current packs/day: 1.00    Average packs/day: 1  pack/day for 5.0 years (5.0 ttl pk-yrs)    Types: Cigarettes   Smokeless tobacco: Never  Substance Use Topics   Alcohol use: Yes    Alcohol/week: 2.0 - 3.0 standard drinks of alcohol    Types: 2 - 3 Standard drinks or equivalent per week    Comment: occasionally   Drug use: No    Allergies as of 11/16/2023 - Review Complete 11/16/2023  Allergen Reaction Noted   Tape Rash 03/14/2022    Review of Systems:    All systems reviewed and negative except where noted in HPI.   Physical Exam:  BP (!) 154/97 (BP Location: Left Arm, Patient Position: Sitting, Cuff Size: Normal)   Pulse 86   Temp 97.6 F (36.4 C) (Oral)   Ht 5\' 6"  (1.676 m)   Wt 205 lb 8 oz (93.2 kg)   LMP 02/10/2012   BMI 33.17 kg/m  Patient's last menstrual period was 02/10/2012.  General:   Alert,  Well-developed, well-nourished, pleasant and cooperative in NAD Lungs:  Respirations even and unlabored.  Clear throughout to auscultation.   No wheezes, crackles, or rhonchi. No acute distress. Heart:  Regular rate and rhythm; no murmurs, clicks, rubs, or gallops. Abdomen:  Normal bowel sounds.  No bruits.  Soft, and non-distended without masses, hepatosplenomegaly or hernias noted.  No Tenderness.  No guarding or rebound tenderness.    Neurologic:  Alert and oriented x3;  grossly normal neurologically. Psych:  Alert and cooperative. Normal mood and affect.  Imaging Studies: No results found.  Assessment and Plan:   [name] is a 47 y.o. y/o female has been referred for:  1.  Chronic GERD for 20 years; last EGD in 2009 showed 3 cm hiatal hernia and mild esophagitis.  GERD not controlled on PPI.  Screen for Barrett's.  Scheduling EGD I discussed risks of EGD with patient to include risk of bleeding, perforation, and risk of sedation.  Patient expressed understanding and agrees to proceed with EGD.   Continue pantoprazole 40 Mg daily, take 30 min. Before breakfast.  Start Famotidine 40mg  1 tablet once daily  before bedtime.  Recommend Lifestyle Modifications to prevent Acid Reflux.  Rec. Avoid coffee, sodas, peppermint, citrus fruits, and spicey foods.  Avoid eating 2-3 hours before bedtime.   2.  Colon cancer screening  Scheduling Colonoscopy I discussed risks of colonoscopy with patient to include risk of bleeding, colon perforation, and risk of sedation.  Patient expressed understanding and agrees to proceed with colonoscopy.   Follow up in 3 months with TG.  Sheryl Amy, PA-C

## 2023-11-24 ENCOUNTER — Telehealth: Payer: Self-pay

## 2023-11-24 NOTE — Telephone Encounter (Signed)
Called and got patient reschedule to 12/13/2023, sent out new instructions and sent kim a message in endo to get patient moved

## 2023-11-24 NOTE — Telephone Encounter (Signed)
[phone] Per pt need to r/s procedure.

## 2023-11-29 ENCOUNTER — Encounter: Payer: Self-pay | Admitting: Gastroenterology

## 2023-11-30 ENCOUNTER — Encounter: Payer: Self-pay | Admitting: Anesthesiology

## 2023-11-30 ENCOUNTER — Encounter: Payer: Self-pay | Admitting: Gastroenterology

## 2023-11-30 NOTE — Anesthesia Preprocedure Evaluation (Signed)
Anesthesia Evaluation    Airway        Dental   Pulmonary Current Smoker          Cardiovascular hypertension,      Neuro/Psych    GI/Hepatic   Endo/Other  diabetes    Renal/GU      Musculoskeletal   Abdominal   Peds  Hematology   Anesthesia Other Findings Endometriosis  Ovarian cyst Type I Diabetes mellitus  Angina GERD (gastroesophageal reflux disease) Anxiety Depression  Dyslipidemia Essential hypertension     Reproductive/Obstetrics                              Anesthesia Physical Anesthesia Plan Anesthesia Quick Evaluation

## 2023-12-13 ENCOUNTER — Ambulatory Visit
Admission: RE | Admit: 2023-12-13 | Payer: BC Managed Care – PPO | Source: Home / Self Care | Admitting: Gastroenterology

## 2023-12-13 HISTORY — DX: Type 1 diabetes mellitus without complications: E10.9

## 2023-12-13 SURGERY — COLONOSCOPY WITH PROPOFOL
Anesthesia: Choice

## 2023-12-21 ENCOUNTER — Ambulatory Visit: Payer: BC Managed Care – PPO | Admitting: Family Medicine

## 2023-12-28 ENCOUNTER — Other Ambulatory Visit: Payer: Self-pay | Admitting: Orthopaedic Surgery

## 2023-12-28 DIAGNOSIS — S92345A Nondisplaced fracture of fourth metatarsal bone, left foot, initial encounter for closed fracture: Secondary | ICD-10-CM

## 2023-12-30 ENCOUNTER — Ambulatory Visit
Admission: RE | Admit: 2023-12-30 | Discharge: 2023-12-30 | Discharge status: Home / Self Care | Payer: Medicaid Other | Source: Ambulatory Visit | Attending: Orthopaedic Surgery

## 2023-12-30 DIAGNOSIS — S92345A Nondisplaced fracture of fourth metatarsal bone, left foot, initial encounter for closed fracture: Secondary | ICD-10-CM

## 2024-01-06 ENCOUNTER — Ambulatory Visit (INDEPENDENT_AMBULATORY_CARE_PROVIDER_SITE_OTHER): Payer: 59 | Admitting: Family Medicine

## 2024-01-06 ENCOUNTER — Encounter: Payer: Self-pay | Admitting: Family Medicine

## 2024-01-06 VITALS — BP 138/72 | HR 84 | Temp 97.8°F | Ht 66.0 in | Wt 207.0 lb

## 2024-01-06 DIAGNOSIS — Z1159 Encounter for screening for other viral diseases: Secondary | ICD-10-CM

## 2024-01-06 DIAGNOSIS — E1065 Type 1 diabetes mellitus with hyperglycemia: Secondary | ICD-10-CM

## 2024-01-06 DIAGNOSIS — K219 Gastro-esophageal reflux disease without esophagitis: Secondary | ICD-10-CM | POA: Diagnosis not present

## 2024-01-06 DIAGNOSIS — R300 Dysuria: Secondary | ICD-10-CM | POA: Insufficient documentation

## 2024-01-06 DIAGNOSIS — E559 Vitamin D deficiency, unspecified: Secondary | ICD-10-CM | POA: Diagnosis not present

## 2024-01-06 DIAGNOSIS — I152 Hypertension secondary to endocrine disorders: Secondary | ICD-10-CM

## 2024-01-06 DIAGNOSIS — E1169 Type 2 diabetes mellitus with other specified complication: Secondary | ICD-10-CM

## 2024-01-06 DIAGNOSIS — I10 Essential (primary) hypertension: Secondary | ICD-10-CM | POA: Diagnosis not present

## 2024-01-06 DIAGNOSIS — Z114 Encounter for screening for human immunodeficiency virus [HIV]: Secondary | ICD-10-CM

## 2024-01-06 DIAGNOSIS — N898 Other specified noninflammatory disorders of vagina: Secondary | ICD-10-CM | POA: Insufficient documentation

## 2024-01-06 DIAGNOSIS — E785 Hyperlipidemia, unspecified: Secondary | ICD-10-CM

## 2024-01-06 DIAGNOSIS — E1159 Type 2 diabetes mellitus with other circulatory complications: Secondary | ICD-10-CM | POA: Insufficient documentation

## 2024-01-06 NOTE — Assessment & Plan Note (Signed)
Non fasting labs today. Continue atorvastatin 20mg  daily. I recommend consuming a heart healthy diet such as Mediterranean diet or DASH diet with whole grains, fruits, vegetable, fish, lean meats, nuts, and olive oil. Limit sweets and processed foods. I also encourage moderate intensity exercise 150 minutes weekly. This is 3-5 times weekly for 30-50 minutes each session. Goal should be pace of 3 miles/hours, or walking 1.5 miles in 30 minutes. The ASCVD Risk score (Arnett DK, et al., 2019) failed to calculate for the following reasons:   Cannot find a previous HDL lab   Cannot find a previous total cholesterol lab

## 2024-01-06 NOTE — Assessment & Plan Note (Addendum)
Followed by Endo. Last A1c 9.4%. Will recheck today. Foot exam and eye exam UTD. PNA UTD. Need uACR. Counseled on importance of diet and exercise.

## 2024-01-06 NOTE — Assessment & Plan Note (Signed)
Controlled on Lisinopril 10mg  daily. Recommend heart healthy diet such as Mediterranean diet with whole grains, fruits, vegetable, fish, lean meats, nuts, and olive oil. Limit salt. Encouraged moderate walking, 3-5 times/week for 30-50 minutes each session. Aim for at least 150 minutes.week. Goal should be pace of 3 miles/hours, or walking 1.5 miles in 30 minutes. Avoid tobacco products. Avoid excess alcohol. Take medications as prescribed and bring medications and blood pressure log with cuff to each office visit. Seek medical care for chest pain, palpitations, shortness of breath with exertion, dizziness/lightheadedness, vision changes, recurrent headaches, or swelling of extremities.

## 2024-01-06 NOTE — Assessment & Plan Note (Signed)
Well controlled on Protonix and Famotidine

## 2024-01-06 NOTE — Assessment & Plan Note (Signed)
Recheck vitamin D today

## 2024-01-06 NOTE — Progress Notes (Signed)
New Patient Office Visit  Subjective    Patient ID: Sheryl White, female    DOB: 1976-04-07  Age: 48 y.o. MRN: [id number]  CC:  Chief Complaint  Patient presents with   Establish Care    HPI Sheryl White presents to establish care. Oriented to practice routines and expectations. 1 year since last PCP visit. PMH includes anxiety, depression, DM1, GERD, HTN, HLD. She does see gastroenterology and endocrinology, see most recent notes below. She does see OBGYN and is due for PAP at [facility].  Breast CA screening: Mammogram status: Completed 2024. Repeat every year Cervical CA screening: approximate date 2024 and was normal, hysterectomy Colon CA screening: colonoscopy  16  years ago without abnormalities. Cologuard 2023. Followed by GI, has been ordered. Tobacco: smoker  (0.5 ppd x 11 yrs) STI: declines Vaccines:  declines covid, UTD PNA, Tdap, flu  Anxiety and depression well controlled without medication.  DM1 on OMNIPOD, followed by Endo, well controlled.  GERD well controlled on protonix and Famotidine. Planned EGD and Colonoscopy she could not afford.  HTN/HLD well controlled on Lisinopril 10mg  daily and Atorvastatin 20mg  daily.  GI office visit 11/16/2023 for reference only: Chronic GERD for 20 years; last EGD in 2009 showed 3 cm hiatal hernia and mild esophagitis.  GERD not controlled on PPI.  Screen for Barrett's.             Scheduling EGD I discussed risks of EGD with patient to include risk of bleeding, perforation, and risk of sedation.  Patient expressed understanding and agrees to proceed with EGD.              Continue pantoprazole 40 Mg daily, take 30 min. Before breakfast.             Start Famotidine 40mg  1 tablet once daily before bedtime.             Recommend Lifestyle Modifications to prevent Acid Reflux.  Rec. Avoid coffee, sodas, peppermint, citrus fruits, and spicey foods.  Avoid eating 2-3 hours before bedtime.    Colon cancer screening              Scheduling Colonoscopy I discussed risks of colonoscopy with patient to include risk of bleeding, colon perforation, and risk of sedation.  Patient expressed understanding and agrees to proceed with colonoscopy.   Endocrinology OV 10/06/2023 for reference only: 1. Type 1 diabetes. Her diabetes is complicated by chronic poor control and now liekly MA and neuropathy. She had a DKA episode in 2/21 and again in 4/23. We discussed previously the importance of getting her sugars and A1c down to avoid future health complications. Her A1c today is 9.4. Her sugars are slightly better than that, though they are still overall too high. Based on review of her CGM, I told her to bolus at the start of every meal or if her sugar is elevated. I am also stopping as below. I encouraged lifestyle modifications.  2. HTN/Microalbinuria associated with diabetes. Her MA was elevated in 9/23. Her BP is good today on 10 mg lisinopril daily. I will recheck her MA today.  3. HLD associated with diabetes. Her LDL was 107 in 9/23. She could benefit for a statin. I will plan to have a discussion with her at a future visit when I do not have to spend so much time discussion her glucose control and complications. I will recheck her lipids today.  4. Vitamin D deficiency. Her vitamin D has  been low. Her pcp gave her prescription strength in the past. Her D was low in 1/22 and in 9/23 off supplementation. She is currently taking 5000 unit D supplementation daily. I will recheck her D level today.  5. GERD. I previously suggested she try Nexium, Omeprazole or Prilosec.  6. Neuropathy. Her 10 g filament was intermittently abnormal by exam in 9/23. We discussed previously foot care and she know to be careful with her feet.  7. Paperwork. I filled out FMLA paperwork for her in 9/23.  8. Prophylaxis. We last got a copy from the eye doctor (Dr. Harriette Bouillon at [facility] in [place] [phone]) on 04/05/23. She had no retinopathy. She  is to f/u in 4/25.  8. Yeast infection. She thinks she has a yeast infection and asks for rx for diflucan. I sent one. I told her to stop her Jardiance for now. She can retry it in a month if she would like. 9. She will return to clinic in 4 months. I will check yearly labs today.  Ask her about smoking status next time  Outpatient Encounter Medications as of 01/06/2024  Medication Sig   atorvastatin (LIPITOR) 20 MG tablet Take 1 tablet by mouth daily.   Cholecalciferol (VITAMIN D-1000 MAX ST) 25 MCG (1000 UT) tablet Take by mouth.   Continuous Glucose Sensor (DEXCOM G7 SENSOR) MISC Use 1 each every 10 (ten) days   Continuous Glucose Transmitter (DEXCOM G6 TRANSMITTER) MISC USE TO MONITOR BLOOD SUGAR. REPLACE EVERY 3 MONTHS   [facility]S ASPIRIN ADULT LOW DOSE 81 MG chewable tablet Chew 81 mg by mouth daily.   famotidine (PEPCID) 40 MG tablet Take 1 tablet (40 mg total) by mouth at bedtime.   Insulin Disposable Pump (OMNIPOD 5 DEXG7G6 PODS GEN 5) MISC INJECT 1 POD SUBCUTANEOUSLY EVERY OTHER DAY   Insulin Disposable Pump (OMNIPOD 5 DEXG7G6 PODS GEN 5) MISC Inject into the skin.   Insulin Human (INSULIN PUMP) SOLN Inject 1 each into the skin 3 times daily with meals, bedtime and 2 AM.   lisinopril (ZESTRIL) 5 MG tablet Take 1 tablet by mouth daily.   meloxicam (MOBIC) 7.5 MG tablet Take 7.5 mg by mouth daily.   pantoprazole (PROTONIX) 40 MG tablet Take 1 tablet (40 mg total) by mouth daily before breakfast.   Cholecalciferol 1.25 MG (50000 UT) capsule Take 1 capsule by mouth once a week. (Patient not taking: Reported on 01/06/2024)   empagliflozin (JARDIANCE) 10 MG TABS tablet TAKE 1 TABLET BY MOUTH EVERY DAY IN THE MORNING (Patient not taking: Reported on 01/06/2024)   estradiol (ESTRACE) 0.1 MG/GM vaginal cream Place 1 Applicatorful vaginally once a week. (Patient not taking: Reported on 01/06/2024)   Insulin Glargine (BASAGLAR KWIKPEN) 100 UNIT/ML  (Patient not taking: Reported on 01/06/2024)   NOVOLOG  100 UNIT/ML injection SMARTSIG:0-100 Unit(s) SUB-Q Daily (Patient not taking: Reported on 11/29/2023)   No facility-administered encounter medications on file as of 01/06/2024.    Past Medical History:  Diagnosis Date   Angina    Anxiety    Depression    Diabetes mellitus    Dyslipidemia    Endometriosis    Essential hypertension 03/14/2022   GERD (gastroesophageal reflux disease)    Ovarian cyst    Type I diabetes mellitus (HCC)     Past Surgical History:  Procedure Laterality Date   ABDOMINAL HYSTERECTOMY     still have left ovary   CARPAL TUNNEL RELEASE     bilateral   CESAREAN SECTION  x 1   COLONOSCOPY     ENDOMETRIAL ABLATION     LAPAROSCOPIC SALPINGOOPHERECTOMY     svd     x 1   TRIGGER FINGER RELEASE     bilateral   TUBAL LIGATION     UPPER GASTROINTESTINAL ENDOSCOPY     WISDOM TOOTH EXTRACTION      Family History  Problem Relation Age of Onset   Colon polyps Father    Cancer Father        ?   Diabetes Son    Diabetes Sister     Social History   Socioeconomic History   Marital status: Single    Spouse name: Not on file   Number of children: 2   Years of education: Not on file   Highest education level: Not on file  Occupational History   Occupation: [employer]    Employer: [employer]  Tobacco Use   Smoking status: Every Day    Current packs/day: 1.00    Average packs/day: 1 pack/day for 5.0 years (5.0 ttl pk-yrs)    Types: Cigarettes   Smokeless tobacco: Never  Substance and Sexual Activity   Alcohol use: Yes    Alcohol/week: 2.0 - 3.0 standard drinks of alcohol    Types: 2 - 3 Standard drinks or equivalent per week    Comment: occasionally   Drug use: No   Sexual activity: Yes    Birth control/protection: Surgical  Other Topics Concern   Not on file  Social History Narrative   Not on file   Social Drivers of Health   Financial Resource Strain: Not on file  Food Insecurity: Not on file  Transportation  Needs: Not on file  Physical Activity: Not on file  Stress: Not on file  Social Connections: Not on file  Intimate Partner Violence: Not on file    Review of Systems  Constitutional: Negative.   HENT: Negative.    Eyes: Negative.   Respiratory: Negative.    Cardiovascular: Negative.   Gastrointestinal: Negative.   Genitourinary: Negative.   Musculoskeletal: Negative.   Skin: Negative.   Neurological: Negative.   Endo/Heme/Allergies: Negative.   Psychiatric/Behavioral: Negative.    All other systems reviewed and are negative.       Objective    BP 138/72   Pulse 84   Temp 97.8 F (36.6 C) (Oral)   Ht 5\' 6"  (1.676 m)   Wt 207 lb (93.9 kg)   LMP 02/10/2012   SpO2 97%   BMI 33.41 kg/m     01/06/2024    3:47 PM 01/06/2024    3:40 PM 11/16/2023    4:38 PM  Vitals with BMI  Height  5\' 6"    Weight  207 lbs   BMI  33.43   Systolic 138 142 409  Diastolic 72 72 83  Pulse  84 86     Physical Exam Vitals and nursing note reviewed.  Constitutional:      Appearance: Normal appearance. She is obese.  HENT:     Head: Normocephalic and atraumatic.     Right Ear: Tympanic membrane, ear canal and external ear normal.     Left Ear: Tympanic membrane, ear canal and external ear normal.     Nose: Nose normal.     Mouth/Throat:     Mouth: Mucous membranes are moist.     Pharynx: Oropharynx is clear.  Eyes:     Extraocular Movements: Extraocular movements intact.  Conjunctiva/sclera: Conjunctivae normal.     Pupils: Pupils are equal, round, and reactive to light.  Cardiovascular:     Rate and Rhythm: Normal rate and regular rhythm.     Pulses: Normal pulses.     Heart sounds: Normal heart sounds.  Pulmonary:     Effort: Pulmonary effort is normal.     Breath sounds: Normal breath sounds.  Abdominal:     General: Bowel sounds are normal.     Palpations: Abdomen is soft.  Musculoskeletal:        General: Normal range of motion.     Cervical back: Normal range of  motion and neck supple.  Skin:    General: Skin is warm and dry.     Capillary Refill: Capillary refill takes less than 2 seconds.  Neurological:     General: No focal deficit present.     Mental Status: She is alert and oriented to person, place, and time. Mental status is at baseline.  Psychiatric:        Mood and Affect: Mood normal.        Behavior: Behavior normal.        Thought Content: Thought content normal.        Judgment: Judgment normal.         Assessment & Plan:   Problem List Items Addressed This Visit     GERD   Well controlled on Protonix and Famotidine       Essential hypertension - Primary   Controlled on Lisinopril 10mg  daily. Recommend heart healthy diet such as Mediterranean diet with whole grains, fruits, vegetable, fish, lean meats, nuts, and olive oil. Limit salt. Encouraged moderate walking, 3-5 times/week for 30-50 minutes each session. Aim for at least 150 minutes.week. Goal should be pace of 3 miles/hours, or walking 1.5 miles in 30 minutes. Avoid tobacco products. Avoid excess alcohol. Take medications as prescribed and bring medications and blood pressure log with cuff to each office visit. Seek medical care for chest pain, palpitations, shortness of breath with exertion, dizziness/lightheadedness, vision changes, recurrent headaches, or swelling of extremities.       Relevant Orders   CBC with Differential/Platelet   COMPLETE METABOLIC PANEL WITH GFR   LDL Cholesterol, Direct   Hemoglobin A1c   Hepatitis C antibody   HIV Antibody (routine testing w rflx)   VITAMIN D 25 Hydroxy (Vit-D Deficiency, Fractures)   Type 1 diabetes mellitus with hyperglycemia (HCC)   Followed by Endo. Last A1c 9.4%. Will recheck today. Foot exam and eye exam UTD. PNA UTD. Need uACR. Counseled on importance of diet and exercise.       Vitamin D deficiency   Recheck vitamin D today      Relevant Orders   VITAMIN D 25 Hydroxy (Vit-D Deficiency, Fractures)    Hypertension associated with diabetes (HCC)   Relevant Orders   CBC with Differential/Platelet   COMPLETE METABOLIC PANEL WITH GFR   LDL Cholesterol, Direct   Hemoglobin A1c   Hyperlipidemia associated with type 2 diabetes mellitus (HCC)   Non fasting labs today. Continue atorvastatin 20mg  daily. I recommend consuming a heart healthy diet such as Mediterranean diet or DASH diet with whole grains, fruits, vegetable, fish, lean meats, nuts, and olive oil. Limit sweets and processed foods. I also encourage moderate intensity exercise 150 minutes weekly. This is 3-5 times weekly for 30-50 minutes each session. Goal should be pace of 3 miles/hours, or walking 1.5 miles in 30 minutes. The ASCVD Risk score (  Arnett DK, et al., 2019) failed to calculate for the following reasons:   Cannot find a previous HDL lab   Cannot find a previous total cholesterol lab       Relevant Orders   CBC with Differential/Platelet   COMPLETE METABOLIC PANEL WITH GFR   LDL Cholesterol, Direct   Hemoglobin A1c   Other Visit Diagnoses       Screening for HIV (human immunodeficiency virus)       Relevant Orders   HIV Antibody (routine testing w rflx)     Need for hepatitis C screening test       Relevant Orders   Hepatitis C antibody       Return in about 6 months (around 07/05/2024) for chronic follow-up with labs 1 week prior.   Park Meo, FNP

## 2024-01-06 NOTE — Patient Instructions (Signed)
It was great to meet you today and I'm excited to have you join the [facility] practice. I hope you had a positive experience today! If you feel so inclined, please feel free to recommend our practice to friends and family. Mila Merry, FNP-C

## 2024-01-07 LAB — HIV ANTIBODY (ROUTINE TESTING W REFLEX): HIV 1&2 Ab, 4th Generation: NONREACTIVE

## 2024-01-07 LAB — COMPLETE METABOLIC PANEL WITH GFR
AG Ratio: 1.4 (calc) (ref 1.0–2.5)
ALT: 27 U/L (ref 6–29)
AST: 27 U/L (ref 10–35)
Albumin: 3.8 g/dL (ref 3.6–5.1)
Alkaline phosphatase (APISO): 82 U/L (ref 31–125)
BUN: 12 mg/dL (ref 7–25)
CO2: 25 mmol/L (ref 20–32)
Calcium: 9.3 mg/dL (ref 8.6–10.2)
Chloride: 103 mmol/L (ref 98–110)
Creat: 0.73 mg/dL (ref 0.50–0.99)
Globulin: 2.8 g/dL (ref 1.9–3.7)
Glucose, Bld: 136 mg/dL — ABNORMAL HIGH (ref 65–99)
Potassium: 4.4 mmol/L (ref 3.5–5.3)
Sodium: 137 mmol/L (ref 135–146)
Total Bilirubin: 0.3 mg/dL (ref 0.2–1.2)
Total Protein: 6.6 g/dL (ref 6.1–8.1)
eGFR: 101 mL/min/{1.73_m2} (ref >=60)

## 2024-01-07 LAB — CBC WITH DIFFERENTIAL/PLATELET
Absolute Lymphocytes: 1556 {cells}/uL (ref 850–3900)
Absolute Monocytes: 672 {cells}/uL (ref 200–950)
Basophils Absolute: 20 {cells}/uL (ref 0–200)
Basophils Relative: 0.5 %
Eosinophils Absolute: 92 {cells}/uL (ref 15–500)
Eosinophils Relative: 2.3 %
HCT: 45.7 % — ABNORMAL HIGH (ref 35.0–45.0)
Hemoglobin: 15.3 g/dL (ref 11.7–15.5)
MCH: 30.8 pg (ref 27.0–33.0)
MCHC: 33.5 g/dL (ref 32.0–36.0)
MCV: 92 fL (ref 80.0–100.0)
MPV: 10.3 fL (ref 7.5–12.5)
Monocytes Relative: 16.8 %
Neutro Abs: 1660 {cells}/uL (ref 1500–7800)
Neutrophils Relative %: 41.5 %
Platelets: 336 10*3/uL (ref 140–400)
RBC: 4.97 10*6/uL (ref 3.80–5.10)
RDW: 11.9 % (ref 11.0–15.0)
Total Lymphocyte: 38.9 %
WBC: 4 10*3/uL (ref 3.8–10.8)

## 2024-01-07 LAB — HEMOGLOBIN A1C
Hgb A1c MFr Bld: 9.1 %{Hb} — ABNORMAL HIGH (ref <5.7)
Mean Plasma Glucose: 214 mg/dL
eAG (mmol/L): 11.9 mmol/L

## 2024-01-07 LAB — LDL CHOLESTEROL, DIRECT: Direct LDL: 108 mg/dL — ABNORMAL HIGH (ref <100)

## 2024-01-07 LAB — HEPATITIS C ANTIBODY: Hepatitis C Ab: NONREACTIVE

## 2024-01-07 LAB — VITAMIN D 25 HYDROXY (VIT D DEFICIENCY, FRACTURES): Vit D, 25-Hydroxy: 30 ng/mL (ref 30–100)

## 2024-01-10 ENCOUNTER — Encounter: Payer: Self-pay | Admitting: Family Medicine

## 2024-01-10 ENCOUNTER — Other Ambulatory Visit: Payer: Self-pay | Admitting: Family Medicine

## 2024-01-10 MED ORDER — ATORVASTATIN CALCIUM 40 MG PO TABS: 40.0000 mg | ORAL_TABLET | Freq: Every day | ORAL | 1 refills | Status: DC

## 2024-01-24 ENCOUNTER — Encounter: Payer: Self-pay | Admitting: Family Medicine

## 2024-01-24 ENCOUNTER — Ambulatory Visit: Payer: Self-pay | Admitting: Family Medicine

## 2024-01-24 ENCOUNTER — Ambulatory Visit: Payer: 59 | Admitting: Family Medicine

## 2024-01-24 VITALS — BP 124/86 | HR 96 | Temp 98.2°F | Ht 66.0 in

## 2024-01-24 DIAGNOSIS — E109 Type 1 diabetes mellitus without complications: Secondary | ICD-10-CM | POA: Diagnosis not present

## 2024-01-24 DIAGNOSIS — M71022 Abscess of bursa, left elbow: Secondary | ICD-10-CM

## 2024-01-24 DIAGNOSIS — M71029 Abscess of bursa, unspecified elbow: Secondary | ICD-10-CM | POA: Insufficient documentation

## 2024-01-24 MED ORDER — AMOXICILLIN-POT CLAVULANATE 875-125 MG PO TABS: 1.0000 | ORAL_TABLET | Freq: Two times a day (BID) | ORAL | 0 refills | Status: DC

## 2024-01-24 MED ORDER — FLUCONAZOLE 150 MG PO TABS: 150.0000 mg | ORAL_TABLET | Freq: Every day | ORAL | 1 refills | Status: DC

## 2024-01-24 MED ORDER — HYDROCODONE-ACETAMINOPHEN 5-325 MG PO TABS: 1.0000 | ORAL_TABLET | Freq: Four times a day (QID) | ORAL | 0 refills | Status: DC | PRN

## 2024-01-24 MED ORDER — CIPROFLOXACIN HCL 500 MG PO TABS: 500.0000 mg | ORAL_TABLET | Freq: Two times a day (BID) | ORAL | 0 refills | Status: DC

## 2024-01-24 MED ORDER — CEFTRIAXONE SODIUM 500 MG IJ SOLR
500.0000 mg | Freq: Once | INTRAMUSCULAR | Status: AC
  Administered 2024-01-24: 500 mg via INTRAMUSCULAR

## 2024-01-24 NOTE — Progress Notes (Signed)
Patient Office Visit  Assessment & Plan:  Abscess of bursa of left elbow -     Amoxicillin-Pot Clavulanate; Take 1 tablet by mouth 2 (two) times daily.  Dispense: 20 tablet; Refill: 0 -     Ciprofloxacin HCl; Take 1 tablet (500 mg total) by mouth 2 (two) times daily for 10 days.  Dispense: 20 tablet; Refill: 0 -     Fluconazole; Take 1 tablet (150 mg total) by mouth daily.  Dispense: 2 tablet; Refill: 1 -     HYDROcodone-Acetaminophen; Take 1 tablet by mouth every 6 (six) hours as needed for moderate pain (pain score 4-6).  Dispense: 30 tablet; Refill: 0 -     cefTRIAXone Sodium  Type 1 diabetes mellitus without complication (HCC)    Return if symptoms worsen or fail to improve.  Patient will let us know how she is doing on Wednesday.  She will send [place] a message via MyChart or come in if not better.  Rocephin 500 IM given in the office today.  Start Augmentin tomorrow since she got Rocephin today. she will start the Cipro today and  Norco as needed for pain.  Diflucan as needed for yeast infection. Subjective:    Patient ID: Sheryl White, female    DOB: 07/24/1976  Age: 48 y.o. MRN: [id number]  Chief Complaint  Patient presents with   Cyst    Inflammed area on L elbow x 1.5 days.     HPI Abscess/olecranon bursitis- Left elbow pain and swelling since Saturday evening. Pain level 8/10 today. Pt having difficulty bending her elbow due to pain. It is red and hot to the touch. BF noticed it before she did. Pt not sure how she got this infection. Pt has habit of picking her skin without realizing it. No fever or chills. Pt has been taking Ibuprofen OTC but not helping too much with pain. Pt states the pain is worse than pain she experienced with her foot fracture. NO trauma or falls.  Pt is right hand dominant, is using crutches now due to left foot fracture. Pt has Ortho follow up this Friday for follow up re foot fracture/cast removal.   Pt is Type 1 diabetic, BS 150-170 which is good  for her. Last A1C was elevated but had improved.  Pt is a smoker, only smoked few cigs today.  Pt has puppy at home who is scratching her a lot and she does not heal very well. Pt has numerous excoriated skin lesions over both arms.    The ASCVD Risk score (Arnett DK, et al., 2019) failed to calculate for the following reasons:   Cannot find a previous HDL lab   Cannot find a previous total cholesterol lab  Past Medical History:  Diagnosis Date   Angina    Anxiety    Depression    Diabetes mellitus    Dyslipidemia    Endometriosis    Essential hypertension 03/14/2022   GERD (gastroesophageal reflux disease)    Ovarian cyst    Type I diabetes mellitus (HCC)    Past Surgical History:  Procedure Laterality Date   ABDOMINAL HYSTERECTOMY     still have left ovary   CARPAL TUNNEL RELEASE     bilateral   CESAREAN SECTION     x 1   COLONOSCOPY     ENDOMETRIAL ABLATION     LAPAROSCOPIC SALPINGOOPHERECTOMY     svd     x 1   TRIGGER FINGER RELEASE  bilateral   TUBAL LIGATION     UPPER GASTROINTESTINAL ENDOSCOPY     WISDOM TOOTH EXTRACTION     Social History   Tobacco Use   Smoking status: Every Day    Current packs/day: 1.00    Average packs/day: 1 pack/day for 5.0 years (5.0 ttl pk-yrs)    Types: Cigarettes   Smokeless tobacco: Never  Substance Use Topics   Alcohol use: Yes    Alcohol/week: 2.0 - 3.0 standard drinks of alcohol    Types: 2 - 3 Standard drinks or equivalent per week    Comment: occasionally   Drug use: No   Family History  Problem Relation Age of Onset   Colon polyps Father    Cancer Father        ?   Diabetes Son    Diabetes Sister    Allergies  Allergen Reactions   Tape Rash    ROS    Objective:    BP 124/86   Pulse 96   Temp 98.2 F (36.8 C)   Ht 5\' 6"  (1.676 m)   LMP 02/10/2012   SpO2 99%   BMI 33.41 kg/m  BP Readings from Last 3 Encounters:  01/24/24 124/86  01/06/24 138/72  11/16/23 (!) 147/83   Wt Readings from Last  3 Encounters:  01/06/24 207 lb (93.9 kg)  11/16/23 205 lb 8 oz (93.2 kg)  03/14/22 176 lb 5.9 oz (80 kg)    Physical Exam Vitals and nursing note reviewed.  Constitutional:      General: She is not in acute distress.    Appearance: Normal appearance.     Comments: Uses crutches.   HENT:     Head: Normocephalic.     Left Ear: Ear canal normal.  Cardiovascular:     Rate and Rhythm: Normal rate and regular rhythm.     Heart sounds: Normal heart sounds.  Pulmonary:     Effort: Pulmonary effort is normal. No respiratory distress.     Breath sounds: Normal breath sounds.  Musculoskeletal:     Left elbow: Swelling and effusion present. Decreased range of motion. Tenderness present in olecranon process.     Comments: Pt using crutches. Left foot is wrapped in a cast  Skin:    Findings: Erythema present.     Comments: Pt hs numerous excoriated skin lesions over both arms, Left elbow swelling over olecranon, redness and tender area extending approx 8 cm by 9.5 cm. No purulence noted. It is warm to the touch. Pt is able to move left arm but has discomfort when she does this.   Neurological:     General: No focal deficit present.     Mental Status: She is alert and oriented to person, place, and time.  Psychiatric:        Behavior: Behavior normal.    No results found for any visits on 01/24/24.

## 2024-01-24 NOTE — Telephone Encounter (Signed)
 Copied from CRM [id number]. Topic: Clinical - Red Word Triage >> Jan 24, 2024  2:59 PM Blair Bumpers wrote: Red Word that prompted transfer to Nurse Triage: Patient states her left elbow is swollen. She thought it was a boil but it's not coming to a head. Wanting to schedule appt with PCP. Has already spoken with pcp via chart.  Chief Complaint: Left elbow swelling Symptoms: Swelling, pain, redness, warmth Frequency: 2 days ago Pertinent Negatives: Patient denies fever Disposition: [] ED /[] Urgent Care (no appt availability in office) / [x] Appointment(In office/virtual)/ []  [facility]/ [] Home Care/ [] Refused Recommended Disposition /[] [place] [facility]/ []  Follow-up with PCP Additional Notes: Patient called in to report pain and swelling of her left elbow. Patient stated elbow is red and warm to the touch. Patient denied fever. This RN advised patient to be seen within 3 days. No availability with PCP. Scheduled patient in office for this afternoon with an alternate provider. Advised patient to call back if anything worsens. Patient complied.   Reason for Disposition  MILD OR MODERATE joint swelling (e.g., feels or looks mildly swollen or puffy)  Answer Assessment - Initial Assessment Questions 1. LOCATION: "Where is the swelling?" (e.g., left, right, both elbows)     Out side of left elbow 2. SIZE and DESCRIPTION: "What does the swelling look like?" (e.g., entire elbow, localized)     Area is red and raised, picture in chart 3. ONSET: "When did the swelling start?" "Does it come and go, or is it there all the time?"     2 days ago 4. SETTING: "Has there been any recent work, exercise or other activity that involved that part of the body?"      Denies 5. AGGRAVATING FACTORS: "What makes the elbow swelling worse?" (e.g., work, sports activities)     Denies 6. ASSOCIATED SYMPTOMS: "Is there any pain or redness?"     Pain is an 8, redness around area, area warm to the touch 7.  OTHER SYMPTOMS: "Do you have any other symptoms?" (e.g., fever)     Denies  Protocols used: Elbow Swelling-A-AH

## 2024-01-25 ENCOUNTER — Encounter: Payer: Self-pay | Admitting: Family Medicine

## 2024-01-25 ENCOUNTER — Encounter (HOSPITAL_BASED_OUTPATIENT_CLINIC_OR_DEPARTMENT_OTHER): Payer: Self-pay | Admitting: Emergency Medicine

## 2024-01-25 ENCOUNTER — Inpatient Hospital Stay (HOSPITAL_BASED_OUTPATIENT_CLINIC_OR_DEPARTMENT_OTHER)
Admission: EM | Admit: 2024-01-25 | Discharge: 2024-01-27 | DRG: 501 | Disposition: A | Discharge status: Home / Self Care | Payer: 59 | Attending: Internal Medicine | Admitting: Internal Medicine

## 2024-01-25 ENCOUNTER — Emergency Department (HOSPITAL_BASED_OUTPATIENT_CLINIC_OR_DEPARTMENT_OTHER): Payer: 59

## 2024-01-25 DIAGNOSIS — Z79899 Other long term (current) drug therapy: Secondary | ICD-10-CM

## 2024-01-25 DIAGNOSIS — F419 Anxiety disorder, unspecified: Secondary | ICD-10-CM | POA: Diagnosis present

## 2024-01-25 DIAGNOSIS — E876 Hypokalemia: Secondary | ICD-10-CM | POA: Diagnosis present

## 2024-01-25 DIAGNOSIS — E1010 Type 1 diabetes mellitus with ketoacidosis without coma: Secondary | ICD-10-CM | POA: Diagnosis present

## 2024-01-25 DIAGNOSIS — Z791 Long term (current) use of non-steroidal anti-inflammatories (NSAID): Secondary | ICD-10-CM

## 2024-01-25 DIAGNOSIS — Z7982 Long term (current) use of aspirin: Secondary | ICD-10-CM

## 2024-01-25 DIAGNOSIS — Z794 Long term (current) use of insulin: Secondary | ICD-10-CM

## 2024-01-25 DIAGNOSIS — Z833 Family history of diabetes mellitus: Secondary | ICD-10-CM

## 2024-01-25 DIAGNOSIS — M7022 Olecranon bursitis, left elbow: Principal | ICD-10-CM | POA: Diagnosis present

## 2024-01-25 DIAGNOSIS — I10 Essential (primary) hypertension: Secondary | ICD-10-CM | POA: Diagnosis present

## 2024-01-25 DIAGNOSIS — Z7984 Long term (current) use of oral hypoglycemic drugs: Secondary | ICD-10-CM

## 2024-01-25 DIAGNOSIS — L02414 Cutaneous abscess of left upper limb: Secondary | ICD-10-CM | POA: Diagnosis present

## 2024-01-25 DIAGNOSIS — L03114 Cellulitis of left upper limb: Secondary | ICD-10-CM | POA: Diagnosis not present

## 2024-01-25 DIAGNOSIS — Z9641 Presence of insulin pump (external) (internal): Secondary | ICD-10-CM | POA: Diagnosis present

## 2024-01-25 DIAGNOSIS — E109 Type 1 diabetes mellitus without complications: Secondary | ICD-10-CM | POA: Diagnosis present

## 2024-01-25 DIAGNOSIS — Z79818 Long term (current) use of other agents affecting estrogen receptors and estrogen levels: Secondary | ICD-10-CM

## 2024-01-25 DIAGNOSIS — Z809 Family history of malignant neoplasm, unspecified: Secondary | ICD-10-CM

## 2024-01-25 DIAGNOSIS — F17210 Nicotine dependence, cigarettes, uncomplicated: Secondary | ICD-10-CM | POA: Diagnosis present

## 2024-01-25 DIAGNOSIS — E785 Hyperlipidemia, unspecified: Secondary | ICD-10-CM | POA: Diagnosis present

## 2024-01-25 DIAGNOSIS — K219 Gastro-esophageal reflux disease without esophagitis: Secondary | ICD-10-CM | POA: Diagnosis present

## 2024-01-25 DIAGNOSIS — L03119 Cellulitis of unspecified part of limb: Secondary | ICD-10-CM | POA: Diagnosis present

## 2024-01-25 DIAGNOSIS — Z91048 Other nonmedicinal substance allergy status: Secondary | ICD-10-CM

## 2024-01-25 DIAGNOSIS — Z83719 Family history of colon polyps, unspecified: Secondary | ICD-10-CM

## 2024-01-25 LAB — CBC WITH DIFFERENTIAL/PLATELET
Abs Immature Granulocytes: 0.02 10*3/uL (ref 0.00–0.07)
Basophils Absolute: 0 10*3/uL (ref 0.0–0.1)
Basophils Relative: 0 %
Eosinophils Absolute: 0.2 10*3/uL (ref 0.0–0.5)
Eosinophils Relative: 2 %
HCT: 42 % (ref 36.0–46.0)
Hemoglobin: 14.4 g/dL (ref 12.0–15.0)
Immature Granulocytes: 0 %
Lymphocytes Relative: 18 %
Lymphs Abs: 1.6 10*3/uL (ref 0.7–4.0)
MCH: 31.1 pg (ref 26.0–34.0)
MCHC: 34.3 g/dL (ref 30.0–36.0)
MCV: 90.7 fL (ref 80.0–100.0)
Monocytes Absolute: 0.6 10*3/uL (ref 0.1–1.0)
Monocytes Relative: 7 %
Neutro Abs: 6.4 10*3/uL (ref 1.7–7.7)
Neutrophils Relative %: 73 %
Platelets: 349 10*3/uL (ref 150–400)
RBC: 4.63 MIL/uL (ref 3.87–5.11)
RDW: 12.1 % (ref 11.5–15.5)
WBC: 8.8 10*3/uL (ref 4.0–10.5)
nRBC: 0 % (ref 0.0–0.2)

## 2024-01-25 LAB — COMPREHENSIVE METABOLIC PANEL
ALT: 11 U/L (ref 0–44)
AST: 11 U/L — ABNORMAL LOW (ref 15–41)
Albumin: 3.4 g/dL — ABNORMAL LOW (ref 3.5–5.0)
Alkaline Phosphatase: 75 U/L (ref 38–126)
Anion gap: 5 (ref 5–15)
BUN: 8 mg/dL (ref 6–20)
CO2: 28 mmol/L (ref 22–32)
Calcium: 8.9 mg/dL (ref 8.9–10.3)
Chloride: 98 mmol/L (ref 98–111)
Creatinine, Ser: 0.76 mg/dL (ref 0.44–1.00)
GFR, Estimated: >60 mL/min (ref >60)
Glucose, Bld: 231 mg/dL — ABNORMAL HIGH (ref 70–99)
Potassium: 3.7 mmol/L (ref 3.5–5.1)
Sodium: 131 mmol/L — ABNORMAL LOW (ref 135–145)
Total Bilirubin: 0.5 mg/dL (ref 0.0–1.2)
Total Protein: 6.9 g/dL (ref 6.5–8.1)

## 2024-01-25 LAB — LACTIC ACID, PLASMA: Lactic Acid, Venous: 0.7 mmol/L (ref 0.5–1.9)

## 2024-01-25 MED ORDER — VANCOMYCIN HCL IN DEXTROSE 1-5 GM/200ML-% IV SOLN
1000.0000 mg | Freq: Once | INTRAVENOUS | Status: AC
  Administered 2024-01-25: 1000 mg via INTRAVENOUS
  Filled 2024-01-25: qty 200

## 2024-01-25 MED ORDER — SODIUM CHLORIDE 0.9 % IV SOLN
2.0000 g | Freq: Once | INTRAVENOUS | Status: AC
  Administered 2024-01-25: 2 g via INTRAVENOUS
  Filled 2024-01-25: qty 12.5

## 2024-01-25 NOTE — ED Provider Notes (Addendum)
[place] EMERGENCY DEPARTMENT AT [facility] Provider Note   CSN: [id number] Arrival date & time: 01/25/24  1740     History  Chief Complaint  Patient presents with   Arm Swelling    Sheryl White is a 48 y.o. female.  Patient seen by primary care doctor yesterday.  Given a shot of Rocephin.  Started on Cipro and Augmentin.  Patient had redness around her right elbow.  She feels it started from the olecranon process.  No significant swelling there.  Yesterday there was redness above and below that area but now its including most of the left forearm.  Patient does have a lot of scratches on both forearms.  But they were present before hand.  Past medical history significant for diabetes hyperlipidemia and hypertension.  Patient is a type I diabetic.  Patient is an everyday smoker.  Patient has an insulin pump.  Past surgical history sniffing for abdominal hysterectomy.       Home Medications Prior to Admission medications   Medication Sig Start Date End Date Taking? Authorizing Provider  amoxicillin-clavulanate (AUGMENTIN) 875-125 MG tablet Take 1 tablet by mouth 2 (two) times daily. 01/24/24   Bernadette Hoit, MD  atorvastatin (LIPITOR) 40 MG tablet Take 1 tablet (40 mg total) by mouth daily. 01/10/24   Park Meo, FNP  Cholecalciferol (VITAMIN D-1000 MAX ST) 25 MCG (1000 UT) tablet Take by mouth.    [provider]  Cholecalciferol 1.25 MG (50000 UT) capsule Take 1 capsule by mouth once a week.    [provider]  ciprofloxacin (CIPRO) 500 MG tablet Take 1 tablet (500 mg total) by mouth 2 (two) times daily for 10 days. 01/24/24 02/03/24  Bernadette Hoit, MD  Continuous Glucose Sensor (DEXCOM G7 SENSOR) MISC Use 1 each every 10 (ten) days 08/09/23   [provider]  Continuous Glucose Transmitter (DEXCOM G6 TRANSMITTER) MISC USE TO MONITOR BLOOD SUGAR. REPLACE EVERY 3 MONTHS 09/24/23   [provider]  [employer] ASPIRIN ADULT LOW DOSE 81 MG  chewable tablet Chew 81 mg by mouth daily. 03/13/22   [provider]  empagliflozin (JARDIANCE) 10 MG TABS tablet     [provider]  estradiol (ESTRACE) 0.1 MG/GM vaginal cream Place 1 Applicatorful vaginally once a week.    [provider]  famotidine (PEPCID) 40 MG tablet Take 1 tablet (40 mg total) by mouth at bedtime. 11/16/23 11/10/24  Celso Amy, PA-C  fluconazole (DIFLUCAN) 150 MG tablet Take 1 tablet (150 mg total) by mouth daily. 01/24/24   Bernadette Hoit, MD  HYDROcodone-acetaminophen (NORCO/VICODIN) 5-325 MG tablet Take 1 tablet by mouth every 6 (six) hours as needed for moderate pain (pain score 4-6). 01/24/24   Bernadette Hoit, MD  Insulin Disposable Pump (OMNIPOD 5 DEXG7G6 PODS GEN 5) MISC INJECT 1 POD SUBCUTANEOUSLY EVERY OTHER DAY    [provider]  Insulin Disposable Pump (OMNIPOD 5 DEXG7G6 PODS GEN 5) MISC Inject into the skin. 06/02/23   [provider]  Insulin Glargine (BASAGLAR KWIKPEN) 100 UNIT/ML  09/16/22   [provider]  Insulin Human (INSULIN PUMP) SOLN Inject 1 each into the skin 3 times daily with meals, bedtime and 2 AM. 03/15/22   Rhetta Mura, MD  lisinopril (ZESTRIL) 5 MG tablet Take 1 tablet by mouth daily.    [provider]  meloxicam (MOBIC) 7.5 MG tablet Take 7.5 mg by mouth daily.    [provider]  NOVOLOG 100 UNIT/ML injection  09/09/23  [provider]  pantoprazole (PROTONIX) 40 MG tablet Take 1 tablet (40 mg total) by mouth daily before breakfast. 11/16/23 11/10/24  Celso Amy, PA-C      Allergies    Tape    Review of Systems   Review of Systems  Constitutional:  Negative for chills and fever.  HENT:  Negative for ear pain and sore throat.   Eyes:  Negative for pain and visual disturbance.  Respiratory:  Negative for cough and shortness of breath.   Cardiovascular:  Negative for chest pain and palpitations.  Gastrointestinal:  Negative for abdominal pain  and vomiting.  Genitourinary:  Negative for dysuria and hematuria.  Musculoskeletal:  Positive for joint swelling. Negative for arthralgias and back pain.  Skin:  Positive for wound. Negative for color change and rash.  Neurological:  Negative for seizures and syncope.  All other systems reviewed and are negative.   Physical Exam Updated Vital Signs BP (!) 149/84   Pulse 91   Temp 97.9 F (36.6 C)   Resp 18   LMP 02/10/2012   SpO2 100%  Physical Exam Vitals and nursing note reviewed.  Constitutional:      General: She is not in acute distress.    Appearance: Normal appearance. She is well-developed. She is not toxic-appearing or diaphoretic.  HENT:     Head: Normocephalic and atraumatic.  Eyes:     Extraocular Movements: Extraocular movements intact.     Conjunctiva/sclera: Conjunctivae normal.     Pupils: Pupils are equal, round, and reactive to light.  Cardiovascular:     Rate and Rhythm: Normal rate and regular rhythm.     Heart sounds: No murmur heard. Pulmonary:     Effort: Pulmonary effort is normal. No respiratory distress.     Breath sounds: Normal breath sounds.  Abdominal:     Palpations: Abdomen is soft.     Tenderness: There is no abdominal tenderness.  Musculoskeletal:        General: Swelling and tenderness present.     Cervical back: Neck supple. No rigidity.     Comments: Erythema proximal to the left elbow up about 6 cm.  And erythema all the way down to the left wrist.  Radial pulses 2+.  Good cap refill to fingers.  Good movement of fingers without any pain.  No significant swelling at the olecranon.  A little bit of callus in that area.  There is increased warmth where the erythema is.  Also patient with a cast on the right foot.  Skin:    General: Skin is warm and dry.     Capillary Refill: Capillary refill takes less than 2 seconds.  Neurological:     General: No focal deficit present.     Mental Status: She is alert and oriented to person, place,  and time.  Psychiatric:        Mood and Affect: Mood normal.     ED Results / Procedures / Treatments   Labs (all labs ordered are listed, but only abnormal results are displayed) Labs Reviewed  COMPREHENSIVE METABOLIC PANEL - Abnormal; Notable for the following components:      Result Value   Sodium 131 (*)    Glucose, Bld 231 (*)    Albumin 3.4 (*)    AST 11 (*)    All other components within normal limits  CULTURE, BLOOD (ROUTINE X 2)  CULTURE, BLOOD (ROUTINE X 2)  LACTIC ACID, PLASMA  CBC WITH DIFFERENTIAL/PLATELET  LACTIC ACID, PLASMA  EKG None  Radiology DG Elbow Complete Left Result Date: 01/25/2024 CLINICAL DATA:  Swelling and cellulitis.  Left arm pain. EXAM: LEFT ELBOW - COMPLETE 3+ VIEW COMPARISON:  None Available. FINDINGS: Diffuse soft tissue swelling about the elbow. No acute fracture or dislocation. No elbow joint effusion. IMPRESSION: Diffuse soft tissue swelling about the elbow. No acute fracture or dislocation. Electronically Signed   By: Minerva Fester M.D.   On: 01/25/2024 21:48    Procedures Procedures    Medications Ordered in ED Medications  vancomycin (VANCOCIN) IVPB 1000 mg/200 mL premix (1,000 mg Intravenous New Bag/Given 01/25/24 2213)  ceFEPIme (MAXIPIME) 2 g in sodium chloride 0.9 % 100 mL IVPB (2 g Intravenous New Bag/Given 01/25/24 2241)    ED Course/ Medical Decision Making/ A&P                                 Medical Decision Making Amount and/or Complexity of Data Reviewed Labs: ordered. Radiology: ordered.  Risk Prescription drug management. Decision regarding hospitalization.   Patient has blood cultures pending x 2 lactic acid was very reassuring at 0.7.  Complete metabolic panel sodium down a little bit at 131 glucose is 231.  CO2 28 electrolytes normal renal function normal with GFR greater than 60.  LFTs normal.  CBC no leukocytosis white count 8.8 hemoglobin 14.4 platelets are 349.  X-ray of the elbow shows diffuse soft  tissue swelling about the elbow no evidence of any joint effusion.  And no acute fracture or dislocation.  Clinically this is a cellulitis.  Not worried about septic joint at all.  Patient just received antibiotics yesterday so technically a failure of antibiotics.  But the erythema has spread significantly through the distal forearm.  Based on that we will treat with more comprehensive antibiotics.  Will have her admitted.  Patient here to receive IV cefepime and and vancomycin.  Will discuss with the hospitalist for admission.  CRITICAL CARE Performed by: Vanetta Mulders Total critical care time: 45 minutes Critical care time was exclusive of separately billable procedures and treating other patients. Critical care was necessary to treat or prevent imminent or life-threatening deterioration. Critical care was time spent personally by me on the following activities: development of treatment plan with patient and/or surrogate as well as nursing, discussions with consultants, evaluation of patient's response to treatment, examination of patient, obtaining history from patient or surrogate, ordering and performing treatments and interventions, ordering and review of laboratory studies, ordering and review of radiographic studies, pulse oximetry and re-evaluation of patient's condition.  Patient with a cast on the right foot.  Patient is followed by [facility].  Clinically not concerned about joint infection at all.  This seems to be all cellulitis.   Final Clinical Impression(s) / ED Diagnoses Final diagnoses:  Cellulitis of left upper extremity    Rx / DC Orders ED Discharge Orders     None         Vanetta Mulders, MD 01/25/24 2246    Vanetta Mulders, MD 01/25/24 2246    Vanetta Mulders, MD 01/25/24 2255

## 2024-01-25 NOTE — ED Triage Notes (Signed)
Left arm pain and swelling Redness  Started Saturday  Seen by PCP given oral ABT and a "shot"  yesterday and today feels worse

## 2024-01-25 NOTE — Progress Notes (Signed)
Plan of Care Note for accepted transfer   Patient: Sheryl White MRN: [id number]   DOA: 01/25/2024  Facility requesting transfer: [facility]   Requesting Provider: Dr. Deretha Emory   Reason for transfer: Cellulitis   Facility course: 48 yr old female with T1DM and left foot fracture presents with worsening LUE redness, swelling, and pain despite receiving IM Rocephin yesterday and Rx for Augmentin and ciprofloxacin.   She is afebrile and WBC and lactate are both normal. There is diffuse soft tissue swelling on plain radiographs.   Blood cultures were collected and she was treated with vancomycin and cefepime.   Plan of care: The patient is accepted for admission to Med-surg  unit, at [facility].   Author: Briscoe Deutscher, MD 01/25/2024  Check [email or web address] for on-call coverage.  Nursing staff, Please call [facility] Admits & Consults System-Wide number on Amion as soon as patient's arrival, so appropriate admitting provider can evaluate the pt.

## 2024-01-26 ENCOUNTER — Other Ambulatory Visit: Payer: Self-pay

## 2024-01-26 DIAGNOSIS — E876 Hypokalemia: Secondary | ICD-10-CM | POA: Diagnosis present

## 2024-01-26 DIAGNOSIS — E785 Hyperlipidemia, unspecified: Secondary | ICD-10-CM | POA: Diagnosis present

## 2024-01-26 DIAGNOSIS — F419 Anxiety disorder, unspecified: Secondary | ICD-10-CM | POA: Diagnosis present

## 2024-01-26 DIAGNOSIS — Z91048 Other nonmedicinal substance allergy status: Secondary | ICD-10-CM | POA: Diagnosis not present

## 2024-01-26 DIAGNOSIS — Z9641 Presence of insulin pump (external) (internal): Secondary | ICD-10-CM | POA: Diagnosis present

## 2024-01-26 DIAGNOSIS — Z7982 Long term (current) use of aspirin: Secondary | ICD-10-CM | POA: Diagnosis not present

## 2024-01-26 DIAGNOSIS — L03119 Cellulitis of unspecified part of limb: Secondary | ICD-10-CM | POA: Diagnosis present

## 2024-01-26 DIAGNOSIS — Z809 Family history of malignant neoplasm, unspecified: Secondary | ICD-10-CM | POA: Diagnosis not present

## 2024-01-26 DIAGNOSIS — I10 Essential (primary) hypertension: Secondary | ICD-10-CM | POA: Diagnosis present

## 2024-01-26 DIAGNOSIS — Z79899 Other long term (current) drug therapy: Secondary | ICD-10-CM | POA: Diagnosis not present

## 2024-01-26 DIAGNOSIS — K219 Gastro-esophageal reflux disease without esophagitis: Secondary | ICD-10-CM | POA: Diagnosis present

## 2024-01-26 DIAGNOSIS — E109 Type 1 diabetes mellitus without complications: Secondary | ICD-10-CM | POA: Diagnosis present

## 2024-01-26 DIAGNOSIS — L03114 Cellulitis of left upper limb: Secondary | ICD-10-CM | POA: Diagnosis present

## 2024-01-26 DIAGNOSIS — Z794 Long term (current) use of insulin: Secondary | ICD-10-CM | POA: Diagnosis not present

## 2024-01-26 DIAGNOSIS — M7022 Olecranon bursitis, left elbow: Secondary | ICD-10-CM | POA: Diagnosis present

## 2024-01-26 DIAGNOSIS — Z833 Family history of diabetes mellitus: Secondary | ICD-10-CM | POA: Diagnosis not present

## 2024-01-26 DIAGNOSIS — F17210 Nicotine dependence, cigarettes, uncomplicated: Secondary | ICD-10-CM | POA: Diagnosis present

## 2024-01-26 DIAGNOSIS — L02414 Cutaneous abscess of left upper limb: Secondary | ICD-10-CM | POA: Diagnosis present

## 2024-01-26 DIAGNOSIS — E1010 Type 1 diabetes mellitus with ketoacidosis without coma: Secondary | ICD-10-CM

## 2024-01-26 DIAGNOSIS — Z7984 Long term (current) use of oral hypoglycemic drugs: Secondary | ICD-10-CM | POA: Diagnosis not present

## 2024-01-26 DIAGNOSIS — Z83719 Family history of colon polyps, unspecified: Secondary | ICD-10-CM | POA: Diagnosis not present

## 2024-01-26 DIAGNOSIS — Z791 Long term (current) use of non-steroidal anti-inflammatories (NSAID): Secondary | ICD-10-CM | POA: Diagnosis not present

## 2024-01-26 DIAGNOSIS — Z79818 Long term (current) use of other agents affecting estrogen receptors and estrogen levels: Secondary | ICD-10-CM | POA: Diagnosis not present

## 2024-01-26 LAB — CBC
HCT: 38.9 % (ref 36.0–46.0)
Hemoglobin: 13.6 g/dL (ref 12.0–15.0)
MCH: 31.3 pg (ref 26.0–34.0)
MCHC: 35 g/dL (ref 30.0–36.0)
MCV: 89.4 fL (ref 80.0–100.0)
Platelets: 336 10*3/uL (ref 150–400)
RBC: 4.35 MIL/uL (ref 3.87–5.11)
RDW: 12.1 % (ref 11.5–15.5)
WBC: 8.8 10*3/uL (ref 4.0–10.5)
nRBC: 0 % (ref 0.0–0.2)

## 2024-01-26 LAB — COMPREHENSIVE METABOLIC PANEL
ALT: 13 U/L (ref 0–44)
AST: 13 U/L — ABNORMAL LOW (ref 15–41)
Albumin: 2.7 g/dL — ABNORMAL LOW (ref 3.5–5.0)
Alkaline Phosphatase: 61 U/L (ref 38–126)
Anion gap: 12 (ref 5–15)
BUN: 9 mg/dL (ref 6–20)
CO2: 22 mmol/L (ref 22–32)
Calcium: 8.7 mg/dL — ABNORMAL LOW (ref 8.9–10.3)
Chloride: 102 mmol/L (ref 98–111)
Creatinine, Ser: 0.59 mg/dL (ref 0.44–1.00)
GFR, Estimated: >60 mL/min (ref >60)
Glucose, Bld: 102 mg/dL — ABNORMAL HIGH (ref 70–99)
Potassium: 3.4 mmol/L — ABNORMAL LOW (ref 3.5–5.1)
Sodium: 136 mmol/L (ref 135–145)
Total Bilirubin: 0.6 mg/dL (ref 0.0–1.2)
Total Protein: 6 g/dL — ABNORMAL LOW (ref 6.5–8.1)

## 2024-01-26 LAB — CBG MONITORING, ED: Glucose-Capillary: 285 mg/dL — ABNORMAL HIGH (ref 70–99)

## 2024-01-26 LAB — LACTIC ACID, PLASMA: Lactic Acid, Venous: 0.8 mmol/L (ref 0.5–1.9)

## 2024-01-26 LAB — GLUCOSE, CAPILLARY
Glucose-Capillary: 194 mg/dL — ABNORMAL HIGH (ref 70–99)
Glucose-Capillary: 117 mg/dL — ABNORMAL HIGH (ref 70–99)
Glucose-Capillary: 124 mg/dL — ABNORMAL HIGH (ref 70–99)

## 2024-01-26 MED ORDER — HEPARIN SODIUM (PORCINE) 5000 UNIT/ML IJ SOLN
5000.0000 [IU] | Freq: Three times a day (TID) | INTRAMUSCULAR | Status: DC
  Administered 2024-01-26 – 2024-01-27 (×3): 5000 [IU] via SUBCUTANEOUS
  Filled 2024-01-26 (×3): qty 1

## 2024-01-26 MED ORDER — IBUPROFEN 200 MG PO TABS: 400.0000 mg | ORAL_TABLET | Freq: Four times a day (QID) | ORAL | Status: DC | PRN

## 2024-01-26 MED ORDER — ALBUTEROL SULFATE (2.5 MG/3ML) 0.083% IN NEBU: 2.5000 mg | INHALATION_SOLUTION | RESPIRATORY_TRACT | Status: DC | PRN

## 2024-01-26 MED ORDER — KETOROLAC TROMETHAMINE 30 MG/ML IJ SOLN
30.0000 mg | Freq: Four times a day (QID) | INTRAMUSCULAR | Status: DC | PRN
  Administered 2024-01-26 – 2024-01-27 (×2): 30 mg via INTRAVENOUS
  Filled 2024-01-26 (×2): qty 1

## 2024-01-26 MED ORDER — LIDOCAINE HCL 1 % IJ SOLN
10.0000 mL | Freq: Once | INTRAMUSCULAR | Status: AC
  Administered 2024-01-26: 10 mL via INTRADERMAL
  Filled 2024-01-26 (×2): qty 10

## 2024-01-26 MED ORDER — VANCOMYCIN HCL 1250 MG/250ML IV SOLN
1250.0000 mg | Freq: Two times a day (BID) | INTRAVENOUS | Status: DC
  Administered 2024-01-26 – 2024-01-27 (×3): 1250 mg via INTRAVENOUS
  Filled 2024-01-26 (×4): qty 250

## 2024-01-26 MED ORDER — SODIUM CHLORIDE 0.9 % IV SOLN
2.0000 g | Freq: Every day | INTRAVENOUS | Status: DC
  Administered 2024-01-26 – 2024-01-27 (×2): 2 g via INTRAVENOUS
  Filled 2024-01-26 (×2): qty 20

## 2024-01-26 MED ORDER — ONDANSETRON HCL 4 MG/2ML IJ SOLN: 4.0000 mg | Freq: Four times a day (QID) | INTRAMUSCULAR | Status: DC | PRN

## 2024-01-26 MED ORDER — INSULIN PUMP
Freq: Three times a day (TID) | SUBCUTANEOUS | Status: DC
  Administered 2024-01-27: 7.65 via SUBCUTANEOUS
  Administered 2024-01-27: 3 via SUBCUTANEOUS
  Filled 2024-01-26: qty 1

## 2024-01-26 MED ORDER — OXYCODONE HCL 5 MG PO TABS: 5.0000 mg | ORAL_TABLET | ORAL | Status: DC | PRN

## 2024-01-26 MED ORDER — ONDANSETRON HCL 4 MG PO TABS: 4.0000 mg | ORAL_TABLET | Freq: Four times a day (QID) | ORAL | Status: DC | PRN

## 2024-01-26 NOTE — Procedures (Signed)
Procedure: Left elbow I&D   Indication: Left olecranon bursitis   Surgeon: Charma Igo, PA-C   Assist: None   Anesthesia: 6ml 1% plain lidocaine   EBL: None   Complications: None   Findings: After risks/benefits explained patient desires to undergo procedure. Consent obtained and time out performed. The left elbow was infiltrated with 6ml lidocaine. The elbow was then prepped and draped and anesthesia confirmed. A longitudinal 6cm incision was made over the olecranon bursa. Blunt dissection was used to explore the elbow and open the bursa. About 5-65ml bloody fluid obtained. A sample was captured and sent for culture. Wound was explored digitally and with a hemostat without e/o further fluid pockets. The wound was then irrigated with 1L NS and a wick was placed. Pt tolerated the procedure well.       Freeman Caldron, PA-C Orthopedic Surgery [phone]

## 2024-01-26 NOTE — H&P (Signed)
History and Physical    Sheryl White VHQ:[id number] DOB: 20-Feb-1976 DOA: 01/25/2024  PCP: Park Meo, FNP  Patient coming from: home  I have personally briefly reviewed patient's old medical records in [facility]  Chief Complaint: arm swelling   HPI: Sheryl White is a 48 y.o. female with medical history significant of  Type 1 DMII, Anxiety, HLD, GERD , HTN  who has interim history of pcp visit 2/10 at which time she diagnosed with abscess of left elbow bursa. Patient at that time was placed on Augmentin,and ciprofloxacin after CTX IM was given in clinic. Patient presents to [facility] due to progression of symptoms.  Patient notes no associated fever/chills/ n/v/d/ chest pain . She does note pain with left arm swelling and erythema.She denies any trauma or bug bites. She notes no history of skin infection in the past. Of note patient has interim hx of left foot fracture and is currently in cast.  ED Course:  Areb bp 143/75, rr 18 sat 99%  hr 84  Wbc 8.8, hgb 14.4, plt 349 Lactic 0.7 Na 131, K 3.7, glu 231, ast 11  Xray left arm IMPRESSION: Diffuse soft tissue swelling about the elbow. No acute fracture or dislocation.  Tx cefepime/vanc   Review of Systems: As per HPI otherwise 10 point review of systems negative.   Past Medical History:  Diagnosis Date   Angina    Anxiety    Depression    Diabetes mellitus    Dyslipidemia    Endometriosis    Essential hypertension 03/14/2022   GERD (gastroesophageal reflux disease)    Ovarian cyst    Type I diabetes mellitus (HCC)     Past Surgical History:  Procedure Laterality Date   ABDOMINAL HYSTERECTOMY     still have left ovary   CARPAL TUNNEL RELEASE     bilateral   CESAREAN SECTION     x 1   COLONOSCOPY     ENDOMETRIAL ABLATION     LAPAROSCOPIC SALPINGOOPHERECTOMY     svd     x 1   TRIGGER FINGER RELEASE     bilateral   TUBAL LIGATION     UPPER GASTROINTESTINAL ENDOSCOPY     WISDOM TOOTH EXTRACTION        reports that she has been smoking cigarettes. She has a 5 pack-year smoking history. She has never used smokeless tobacco. She reports current alcohol use of about 2.0 - 3.0 standard drinks of alcohol per week. She reports that she does not use drugs.  Allergies  Allergen Reactions   Tape Rash    Family History  Problem Relation Age of Onset   Colon polyps Father    Cancer Father        ?   Diabetes Son    Diabetes Sister     Prior to Admission medications   Medication Sig Start Date End Date Taking? Authorizing Provider  amoxicillin-clavulanate (AUGMENTIN) 875-125 MG tablet Take 1 tablet by mouth 2 (two) times daily. 01/24/24   Bernadette Hoit, MD  atorvastatin (LIPITOR) 40 MG tablet Take 1 tablet (40 mg total) by mouth daily. 01/10/24   Park Meo, FNP  Cholecalciferol (VITAMIN D-1000 MAX ST) 25 MCG (1000 UT) tablet Take by mouth.    [provider]  Cholecalciferol 1.25 MG (50000 UT) capsule Take 1 capsule by mouth once a week.    [provider]  ciprofloxacin (CIPRO) 500 MG tablet Take 1 tablet (500 mg total) by mouth  2 (two) times daily for 10 days. 01/24/24 02/03/24  Bernadette Hoit, MD  Continuous Glucose Sensor (DEXCOM G7 SENSOR) MISC Use 1 each every 10 (ten) days 08/09/23   [provider]  Continuous Glucose Transmitter (DEXCOM G6 TRANSMITTER) MISC USE TO MONITOR BLOOD SUGAR. REPLACE EVERY 3 MONTHS 09/24/23   [provider]  [facility] ASPIRIN ADULT LOW DOSE 81 MG chewable tablet Chew 81 mg by mouth daily. 03/13/22   [provider]  empagliflozin (JARDIANCE) 10 MG TABS tablet     [provider]  estradiol (ESTRACE) 0.1 MG/GM vaginal cream Place 1 Applicatorful vaginally once a week.    [provider]  famotidine (PEPCID) 40 MG tablet Take 1 tablet (40 mg total) by mouth at bedtime. 11/16/23 11/10/24  Celso Amy, PA-C  fluconazole (DIFLUCAN) 150 MG tablet Take 1 tablet (150 mg total) by mouth daily. 01/24/24   Bernadette Hoit, MD  HYDROcodone-acetaminophen (NORCO/VICODIN) 5-325 MG tablet Take 1 tablet by mouth every 6 (six) hours as needed for moderate pain (pain score 4-6). 01/24/24   Bernadette Hoit, MD  Insulin Disposable Pump (OMNIPOD 5 DEXG7G6 PODS GEN 5) MISC INJECT 1 POD SUBCUTANEOUSLY EVERY OTHER DAY    [provider]  Insulin Disposable Pump (OMNIPOD 5 DEXG7G6 PODS GEN 5) MISC Inject into the skin. 06/02/23   [provider]  Insulin Glargine (BASAGLAR KWIKPEN) 100 UNIT/ML  09/16/22   [provider]  Insulin Human (INSULIN PUMP) SOLN Inject 1 each into the skin 3 times daily with meals, bedtime and 2 AM. 03/15/22   Rhetta Mura, MD  lisinopril (ZESTRIL) 5 MG tablet Take 1 tablet by mouth daily.    [provider]  meloxicam (MOBIC) 7.5 MG tablet Take 7.5 mg by mouth daily.    [provider]  NOVOLOG 100 UNIT/ML injection  09/09/23   [provider]  pantoprazole (PROTONIX) 40 MG tablet Take 1 tablet (40 mg total) by mouth daily before breakfast. 11/16/23 11/10/24  Celso Amy, PA-C    Physical Exam: Vitals:   01/25/24 2100 01/25/24 2130 01/26/24 0127 01/26/24 0152  BP: (!) 143/77 (!) 149/84 (!) 158/78 134/64  Pulse: 81 91 89 74  Resp: 18 18 17 18   Temp:    (!) 97.5 F (36.4 C)  TempSrc:    Oral  SpO2: 100% 100% 100% 98%  Weight:    95 kg  Height:    5\' 6"  (1.676 m)    Constitutional: NAD, calm, comfortable Vitals:   01/25/24 2100 01/25/24 2130 01/26/24 0127 01/26/24 0152  BP: (!) 143/77 (!) 149/84 (!) 158/78 134/64  Pulse: 81 91 89 74  Resp: 18 18 17 18   Temp:    (!) 97.5 F (36.4 C)  TempSrc:    Oral  SpO2: 100% 100% 100% 98%  Weight:    95 kg  Height:    5\' 6"  (1.676 m)   Eyes: PERRL, lids and conjunctivae normal ENMT: Mucous membranes are moist. Posterior pharynx clear of any exudate or lesions.Normal dentition.  Neck: normal, supple, no masses, no thyromegaly Respiratory: clear to auscultation bilaterally, no  wheezing, no crackles. Normal respiratory effort. No accessory muscle use.  Cardiovascular: Regular rate and rhythm, no murmurs / rubs / gallops. No extremity edema. 2+ pedal pulses. No carotid bruits.  Abdomen: no tenderness, no masses palpated. No hepatosplenomegaly. Bowel sounds positive.  Musculoskeletal: no clubbing / cyanosis. No joint deformity upper and lower extremities. Good ROM, no contractures. Normal muscle tone.  Skin: left arm swollen  from hand to elbow, warm red /tender  Neurologic: CN 2-12 grossly intact. Sensation intact,Strength 5/5 in all 4.  Psychiatric: Normal judgment and insight. Alert and oriented x 3. Normal mood.    Labs on Admission: I have personally reviewed following labs and imaging studies  CBC: Recent Labs  Lab 01/25/24 1808  WBC 8.8  NEUTROABS 6.4  HGB 14.4  HCT 42.0  MCV 90.7  PLT 349   Basic Metabolic Panel: Recent Labs  Lab 01/25/24 1808  NA 131*  K 3.7  CL 98  CO2 28  GLUCOSE 231*  BUN 8  CREATININE 0.76  CALCIUM 8.9   GFR: Estimated Creatinine Clearance: 98.8 mL/min (by C-G formula based on SCr of 0.76 mg/dL). Liver Function Tests: Recent Labs  Lab 01/25/24 1808  AST 11*  ALT 11  ALKPHOS 75  BILITOT 0.5  PROT 6.9  ALBUMIN 3.4*   No results for input(s): "LIPASE", "AMYLASE" in the last 168 hours. No results for input(s): "AMMONIA" in the last 168 hours. Coagulation Profile: No results for input(s): "INR", "PROTIME" in the last 168 hours. Cardiac Enzymes: No results for input(s): "CKTOTAL", "CKMB", "CKMBINDEX", "TROPONINI" in the last 168 hours. BNP (last 3 results) No results for input(s): "PROBNP" in the last 8760 hours. HbA1C: No results for input(s): "HGBA1C" in the last 72 hours. CBG: Recent Labs  Lab 01/26/24 0041  GLUCAP 285*   Lipid Profile: No results for input(s): "CHOL", "HDL", "LDLCALC", "TRIG", "CHOLHDL", "LDLDIRECT" in the last 72 hours. Thyroid Function Tests: No results for input(s): "TSH",  "T4TOTAL", "FREET4", "T3FREE", "THYROIDAB" in the last 72 hours. Anemia Panel: No results for input(s): "VITAMINB12", "FOLATE", "FERRITIN", "TIBC", "IRON", "RETICCTPCT" in the last 72 hours. Urine analysis:    Component Value Date/Time   COLORURINE STRAW (A) 03/14/2022 1115   APPEARANCEUR CLEAR 03/14/2022 1115   LABSPEC 1.024 03/14/2022 1115   PHURINE 5.0 03/14/2022 1115   GLUCOSEU >=500 (A) 03/14/2022 1115   HGBUR NEGATIVE 03/14/2022 1115   BILIRUBINUR NEGATIVE 03/14/2022 1115   KETONESUR 80 (A) 03/14/2022 1115   PROTEINUR 30 (A) 03/14/2022 1115   UROBILINOGEN 0.2 03/30/2015 1930   NITRITE NEGATIVE 03/14/2022 1115   LEUKOCYTESUR NEGATIVE 03/14/2022 1115    Radiological Exams on Admission: DG Elbow Complete Left Result Date: 01/25/2024 CLINICAL DATA:  Swelling and cellulitis.  Left arm pain. EXAM: LEFT ELBOW - COMPLETE 3+ VIEW COMPARISON:  None Available. FINDINGS: Diffuse soft tissue swelling about the elbow. No acute fracture or dislocation. No elbow joint effusion. IMPRESSION: Diffuse soft tissue swelling about the elbow. No acute fracture or dislocation. Electronically Signed   By: Minerva Fester M.D.   On: 01/25/2024 21:48    EKG: Independently reviewed.  Assessment/Plan Left Arm Cellulitis -admit to med/surg  - ctx/ vanc  -supportive care   Type 1 DMII -continue insulin pump  Tobacco abuse -encourage cessation   Anxiety -resume home regimen    HLD -continue statin    GERD -ppi  HTN -stable on lisinopril  DVT prophylaxis: heparin Code Status: full/ as discussed per patient wishes in event of cardiac arrest  Family Communication: none at bedside Disposition Plan: patient  expected to be admitted greater than 2 midnights  Consults called: n/a Admission status: med/surg    Lurline Del MD [facility]  If 7PM-7AM, please contact night-coverage [email or web address] Password [facility]  01/26/2024, 3:14 AM

## 2024-01-26 NOTE — Progress Notes (Signed)
  Progress Note   Patient: Sheryl White WNU:[id number] DOB: 05-15-1976 DOA: 01/25/2024     0 DOS: the patient was seen and examined on 01/26/2024   Brief hospital course: 48 year old woman PMH type 1 diabetes managed on insulin pump who presented with swelling of the left elbow and forearm.  Plain film was negative and she was admitted for IV antibiotics.  Consultants Hand surgery  Procedures/Events 2/11 admit for left upper extremity cellulitis 2/12 no improvement.  Hand surgery consulted.  Assessment and Plan: Left upper extremity cellulitis Suspected olecranon bursitis with abscess Afebrile, hemodynamic stable.  No leukocytosis.  Nevertheless failure to improve on current IV antibiotics with significant induration and swelling.  Suspect abscess in the bursa. Discussed with hand surgery, consultation placed.  Hold off on imaging until they evaluate. Continue ceftriaxone and vancomycin for now.  Diabetes mellitus type 1 Managed with insulin pump.  Continue pump.  Will ask diabetes RN to review. CBGs per protocol.       Subjective:  No improvement Arm still swollen Painful at elbow Hand swollen No paresthesias  Physical Exam: Vitals:   01/26/24 0127 01/26/24 0152 01/26/24 0417 01/26/24 0811  BP: (!) 158/78 134/64 (!) 148/71 133/72  Pulse: 89 74 79 79  Resp: 17 18 18    Temp:  (!) 97.5 F (36.4 C) 98.5 F (36.9 C) 98 F (36.7 C)  TempSrc:  Oral    SpO2: 100% 98% 97% 97%  Weight:  95 kg    Height:  5\' 6"  (1.676 m)     Physical Exam Vitals reviewed.  Constitutional:      General: She is not in acute distress.    Appearance: She is not ill-appearing or toxic-appearing.  Cardiovascular:     Rate and Rhythm: Normal rate and regular rhythm.     Heart sounds: No murmur heard. Pulmonary:     Effort: Pulmonary effort is normal. No respiratory distress.     Breath sounds: No wheezing, rhonchi or rales.  Musculoskeletal:     Comments: There is swelling and  induration of the left upper extremity from above the elbow to the hand.  Particularly there is erythema and swelling over the olecranon with some fluctuance over the olecranon bursa.  There is no active drainage or opening.  Sensation appears to be grossly intact, range of movement is nearly intact.  Neurological:     Mental Status: She is alert.  Psychiatric:        Mood and Affect: Mood normal.        Behavior: Behavior normal.          Data Reviewed: Potassium is 3.4, remainder CMP unremarkable Lactic acid within normal limits CBC within normal limits Plain film elbow showed edema  Family Communication: none  Disposition: Status is: Observation     Time spent: 35 minutes  Author: Brendia Sacks, MD 01/26/2024 11:28 AM  For on call review [email or web address].

## 2024-01-26 NOTE — ED Notes (Signed)
[facility] at bedside to transport pt to [facility]

## 2024-01-26 NOTE — Plan of Care (Signed)

## 2024-01-26 NOTE — Progress Notes (Signed)
Pharmacy Antibiotic Note  Sheryl White is a 48 y.o. female admitted on 01/25/2024 with LUE cellulitis.  Pharmacy has been consulted for Vancomycin dosing.  Vancomycin 1 g IV given in ED at  10 pm 2/11  Plan: Vancomycin 1250 mg IV q12h  Height: 5\' 6"  (167.6 cm) Weight: 95 kg (209 lb 7 oz) IBW/kg (Calculated) : 59.3  Temp (24hrs), Avg:98 F (36.7 C), Min:97.5 F (36.4 C), Max:98.5 F (36.9 C)  Recent Labs  Lab 01/25/24 1808 01/26/24 0435  WBC 8.8 8.8  CREATININE 0.76  --   LATICACIDVEN 0.7 0.8    Estimated Creatinine Clearance: 98.8 mL/min (by C-G formula based on SCr of 0.76 mg/dL).    Allergies  Allergen Reactions   Tape Rash    Sheryl White 01/26/2024 5:57 AM

## 2024-01-26 NOTE — Hospital Course (Signed)
48 year old woman PMH type 1 diabetes managed on insulin pump who presented with swelling of the left elbow and forearm.  Plain film was negative and she was admitted for IV antibiotics.  Consultants Hand surgery  Procedures/Events 2/11 admit for left upper extremity cellulitis 2/12 no improvement.  Hand surgery consulted.

## 2024-01-26 NOTE — TOC CM/SW Note (Signed)
Transition of Care [facility]) - Inpatient Brief Assessment   Patient Details  Name: Sheryl White MRN: [id number] Date of Birth: [birth year]  Transition of Care [facility]) CM/SW Contact:    Harriet Masson, RN Phone Number: 01/26/2024, 11:46 AM   Clinical Narrative:  Patient admitted for left upper extremity cellulitis. Transition of Care Department [facility]) has reviewed patient and no TOC needs have been identified at this time. We will continue to monitor patient advancement through interdisciplinary progression rounds. If new patient transition needs arise, please place a TOC consult.  Transition of Care Asessment: Insurance and Status: Insurance coverage has been reviewed Patient has primary care physician: Yes Home environment has been reviewed: safe to dicharge home when medically stable Prior level of function:: independent Prior/Current Home Services: No current home services Social Drivers of Health Review: SDOH reviewed no interventions necessary Readmission risk has been reviewed: Yes Transition of care needs: no transition of care needs at this time

## 2024-01-26 NOTE — Inpatient Diabetes Management (Signed)
Inpatient Diabetes Program Recommendations  AACE/ADA: New Consensus Statement on Inpatient Glycemic Control (2015)  Target Ranges:  Prepandial:   less than 140 mg/dL      Peak postprandial:   less than 180 mg/dL (1-2 hours)      Critically ill patients:  140 - 180 mg/dL   Lab Results  Component Value Date   GLUCAP 117 (H) 01/26/2024   HGBA1C 9.1 (H) 01/06/2024    Review of Glycemic Control  Latest Reference Range & Units 01/26/24 00:41 01/26/24 08:10 01/26/24 12:08  Glucose-Capillary 70 - 99 mg/dL 409 (H) 811 (H) 914 (H)   Diabetes history: DM 1 Outpatient Diabetes medications:  Omnipod insulin pump with Dexcom G7 Basal insulin rates: MN-4a-1.0 unit/hr 4a-8a-1.1 units/hr 8a-5p-1.0 units/hr 5p-MN- 1.1 units/hr Total basal=25.1 units/ 24 hours 1 unit/10 grams of CHO, ISF: 35 with target of 110 mg/dL Current orders for Inpatient glycemic control:  Insulin pump  Inpatient Diabetes Program Recommendations:    Briefly spoke to patient and RN regarding insulin pump.  Patient changes both her omnipod and her Dexcom sensor yesterday.  I encouraged her to have family bring another pod, just in case it needs to be changed while in the hospital.  Patient see's Dr. Donald Prose and is very familiar with pump and management of DM.  Last A1C was improved but still greater than goal.   Will follow while in the hospital. No needs noted at this time.   Thanks,  Lorenza Cambridge, RN, BC-ADM Inpatient Diabetes Coordinator Pager [phone]  (8a-5p)

## 2024-01-26 NOTE — Consult Note (Signed)
Reason for Consult:Left olecranon bursitis Referring Physician: Brendia Sacks Time called: 1122 Time at bedside: 1202   Sheryl White is an 48 y.o. female.  HPI: Sheryl White developed redness, pain, and swelling of the left elbow late last week. This progressed and she saw PCP Monday who gave her shot of abx. She continued to worsen and was admitted yesterday. She initially got better but worsened overnight again and orthopedic surgery was consulted. She denies fevers, chills, sweats, N/V, or prior e/o. 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  Past Medical History:  Diagnosis Date   Angina    Anxiety    Depression    Diabetes mellitus    Dyslipidemia    Endometriosis    Essential hypertension 03/14/2022   GERD (gastroesophageal reflux disease)    Ovarian cyst    Type I diabetes mellitus (HCC)     Past Surgical History:  Procedure Laterality Date   ABDOMINAL HYSTERECTOMY     still have left ovary   CARPAL TUNNEL RELEASE     bilateral   CESAREAN SECTION     x 1   COLONOSCOPY     ENDOMETRIAL ABLATION     LAPAROSCOPIC SALPINGOOPHERECTOMY     svd     x 1   TRIGGER FINGER RELEASE     bilateral   TUBAL LIGATION     UPPER GASTROINTESTINAL ENDOSCOPY     WISDOM TOOTH EXTRACTION      Family History  Problem Relation Age of Onset   Colon polyps Father    Cancer Father        ?   Diabetes Son    Diabetes Sister     Social History:  reports that she has been smoking cigarettes. She has a 5 pack-year smoking history. She has never used smokeless tobacco. She reports current alcohol use of about 2.0 - 3.0 standard drinks of alcohol per week. She reports that she does not use drugs.  Allergies:  Allergies  Allergen Reactions   Tape Rash    Medications: I have reviewed the patient's current medications.  Results for orders placed or performed during the hospital encounter of 01/25/24 (from the past 48 hours)  Culture, blood (Routine X 2) w Reflex to ID Panel      Status: None (Preliminary result)   Collection Time: 01/25/24  6:07 PM   Specimen: BLOOD  Result Value Ref Range   Specimen Description      BLOOD RIGHT ANTECUBITAL Performed at [facility], [address] [place], [place] [address]    Special Requests      NONE Performed at [facility], [address], [place], [place] [address]    Culture PENDING    Report Status PENDING   Lactic acid, plasma     Status: None   Collection Time: 01/25/24  6:08 PM  Result Value Ref Range   Lactic Acid, Venous 0.7 0.5 - 1.9 mmol/L    Comment: Performed at [facility], [address], [place], [place] [address]  Comprehensive metabolic panel     Status: Abnormal   Collection Time: 01/25/24  6:08 PM  Result Value Ref Range   Sodium 131 (L) 135 - 145 mmol/L   Potassium 3.7 3.5 - 5.1 mmol/L   Chloride 98 98 - 111 mmol/L   CO2 28 22 - 32 mmol/L   Glucose, Bld 231 (H) 70 - 99 mg/dL    Comment: Glucose reference range applies only to samples  taken after fasting for at least 8 hours.   BUN 8 6 - 20 mg/dL   Creatinine, Ser 8.65 0.44 - 1.00 mg/dL   Calcium 8.9 8.9 - 78.4 mg/dL   Total Protein 6.9 6.5 - 8.1 g/dL   Albumin 3.4 (L) 3.5 - 5.0 g/dL   AST 11 (L) 15 - 41 U/L   ALT 11 0 - 44 U/L   Alkaline Phosphatase 75 38 - 126 U/L   Total Bilirubin 0.5 0.0 - 1.2 mg/dL   GFR, Estimated >69 >62 mL/min    Comment: (NOTE) Calculated using the CKD-EPI Creatinine Equation (2021)    Anion gap 5 5 - 15    Comment: Performed at [facility], [address] [place], [place] [address]  CBC with Differential     Status: None   Collection Time: 01/25/24  6:08 PM  Result Value Ref Range   WBC 8.8 4.0 - 10.5 K/uL   RBC 4.63 3.87 - 5.11 MIL/uL   Hemoglobin 14.4 12.0 - 15.0 g/dL   HCT 13.2 44.0 - 10.2 %   MCV 90.7 80.0 - 100.0 fL   MCH 31.1 26.0 - 34.0 pg   MCHC 34.3 30.0 - 36.0 g/dL   RDW 72.5 36.6 - 44.0 %   Platelets 349 150 - 400 K/uL    nRBC 0.0 0.0 - 0.2 %   Neutrophils Relative % 73 %   Neutro Abs 6.4 1.7 - 7.7 K/uL   Lymphocytes Relative 18 %   Lymphs Abs 1.6 0.7 - 4.0 K/uL   Monocytes Relative 7 %   Monocytes Absolute 0.6 0.1 - 1.0 K/uL   Eosinophils Relative 2 %   Eosinophils Absolute 0.2 0.0 - 0.5 K/uL   Basophils Relative 0 %   Basophils Absolute 0.0 0.0 - 0.1 K/uL   Immature Granulocytes 0 %   Abs Immature Granulocytes 0.02 0.00 - 0.07 K/uL    Comment: Performed at [facility], [address], [place], [place] [address]  Culture, blood (Routine X 2) w Reflex to ID Panel     Status: None (Preliminary result)   Collection Time: 01/25/24 10:15 PM   Specimen: BLOOD RIGHT HAND  Result Value Ref Range   Specimen Description      BLOOD RIGHT HAND Performed at [facility], [address], [place], [place] [address]    Special Requests      NONE Performed at [facility], [address], [place], [place] [address]    Culture PENDING    Report Status PENDING   POC CBG, ED     Status: Abnormal   Collection Time: 01/26/24 12:41 AM  Result Value Ref Range   Glucose-Capillary 285 (H) 70 - 99 mg/dL    Comment: Glucose reference range applies only to samples taken after fasting for at least 8 hours.  Lactic acid, plasma     Status: None   Collection Time: 01/26/24  4:35 AM  Result Value Ref Range   Lactic Acid, Venous 0.8 0.5 - 1.9 mmol/L    Comment: Performed at [facility], [address], [place], [place] [address]  CBC     Status: None   Collection Time: 01/26/24  4:35 AM  Result Value Ref Range   WBC 8.8 4.0 - 10.5 K/uL   RBC 4.35 3.87 - 5.11 MIL/uL   Hemoglobin 13.6 12.0 - 15.0 g/dL   HCT 18.8 41.6 - 60.6 %   MCV 89.4 80.0 - 100.0 fL  MCH 31.3 26.0 - 34.0 pg   MCHC 35.0 30.0 - 36.0 g/dL   RDW 16.1 09.6 - 04.5 %   Platelets 336 150 - 400 K/uL   nRBC 0.0 0.0 - 0.2 %    Comment: Performed at [facility], [address], [place], [place]  [address]  Comprehensive metabolic panel     Status: Abnormal   Collection Time: 01/26/24  4:35 AM  Result Value Ref Range   Sodium 136 135 - 145 mmol/L   Potassium 3.4 (L) 3.5 - 5.1 mmol/L   Chloride 102 98 - 111 mmol/L   CO2 22 22 - 32 mmol/L   Glucose, Bld 102 (H) 70 - 99 mg/dL    Comment: Glucose reference range applies only to samples taken after fasting for at least 8 hours.   BUN 9 6 - 20 mg/dL   Creatinine, Ser 1.91 0.44 - 1.00 mg/dL   Calcium 8.7 (L) 8.9 - 10.3 mg/dL   Total Protein 6.0 (L) 6.5 - 8.1 g/dL   Albumin 2.7 (L) 3.5 - 5.0 g/dL   AST 13 (L) 15 - 41 U/L   ALT 13 0 - 44 U/L   Alkaline Phosphatase 61 38 - 126 U/L   Total Bilirubin 0.6 0.0 - 1.2 mg/dL   GFR, Estimated >47 >82 mL/min    Comment: (NOTE) Calculated using the CKD-EPI Creatinine Equation (2021)    Anion gap 12 5 - 15    Comment: Performed at [facility], [address], [place], [place] [address]  Glucose, capillary     Status: Abnormal   Collection Time: 01/26/24  8:10 AM  Result Value Ref Range   Glucose-Capillary 194 (H) 70 - 99 mg/dL    Comment: Glucose reference range applies only to samples taken after fasting for at least 8 hours.  Glucose, capillary     Status: Abnormal   Collection Time: 01/26/24 12:08 PM  Result Value Ref Range   Glucose-Capillary 117 (H) 70 - 99 mg/dL    Comment: Glucose reference range applies only to samples taken after fasting for at least 8 hours.    DG Elbow Complete Left Result Date: 01/25/2024 CLINICAL DATA:  Swelling and cellulitis.  Left arm pain. EXAM: LEFT ELBOW - COMPLETE 3+ VIEW COMPARISON:  None Available. FINDINGS: Diffuse soft tissue swelling about the elbow. No acute fracture or dislocation. No elbow joint effusion. IMPRESSION: Diffuse soft tissue swelling about the elbow. No acute fracture or dislocation. Electronically Signed   By: Minerva Fester M.D.   On: 01/25/2024 21:48    Review of Systems  Constitutional:  Negative for chills, diaphoresis and  fever.  HENT:  Negative for ear discharge, ear pain, hearing loss and tinnitus.   Eyes:  Negative for photophobia and pain.  Respiratory:  Negative for cough and shortness of breath.   Cardiovascular:  Negative for chest pain.  Gastrointestinal:  Negative for abdominal pain, nausea and vomiting.  Genitourinary:  Negative for dysuria, flank pain, frequency and urgency.  Musculoskeletal:  Positive for arthralgias (Left elbow). Negative for back pain, myalgias and neck pain.  Neurological:  Negative for dizziness and headaches.  Hematological:  Does not bruise/bleed easily.  Psychiatric/Behavioral:  The patient is not nervous/anxious.    Blood pressure 133/72, pulse 79, temperature 98 F (36.7 C), resp. rate 18, height 5\' 6"  (1.676 m), weight 95 kg, last menstrual period 02/10/2012, SpO2 97%. Physical Exam Constitutional:      General: She is not in acute  distress.    Appearance: She is well-developed. She is not diaphoretic.  HENT:     Head: Normocephalic and atraumatic.  Eyes:     General: No scleral icterus.       Right eye: No discharge.        Left eye: No discharge.     Conjunctiva/sclera: Conjunctivae normal.  Cardiovascular:     Rate and Rhythm: Normal rate and regular rhythm.  Pulmonary:     Effort: Pulmonary effort is normal. No respiratory distress.  Musculoskeletal:     Cervical back: Normal range of motion.     Comments: Left shoulder, elbow, wrist, digits- no skin wounds, mod TTP post elbow, erythema extending to distal upper arm and distal 1/3 FA, no instability, no blocks to motion  Sens  Ax/R/M/U intact  Mot   Ax/ R/ PIN/ M/ AIN/ U intact  Rad 2+  Skin:    General: Skin is warm and dry.  Neurological:     Mental Status: She is alert.  Psychiatric:        Mood and Affect: Mood normal.        Behavior: Behavior normal.     Assessment/Plan: Left olecranon bursitis -- Plan I&D today at bedside.    Freeman Caldron, PA-C Orthopedic  Surgery [phone] 01/26/2024, 12:43 PM

## 2024-01-27 DIAGNOSIS — L03114 Cellulitis of left upper limb: Secondary | ICD-10-CM | POA: Diagnosis not present

## 2024-01-27 LAB — GLUCOSE, CAPILLARY: Glucose-Capillary: 152 mg/dL — ABNORMAL HIGH (ref 70–99)

## 2024-01-27 MED ORDER — IBUPROFEN 400 MG PO TABS: 400.0000 mg | ORAL_TABLET | Freq: Four times a day (QID) | ORAL | Status: DC | PRN

## 2024-01-27 MED ORDER — ENOXAPARIN SODIUM 40 MG/0.4ML IJ SOSY
40.0000 mg | PREFILLED_SYRINGE | INTRAMUSCULAR | Status: DC
  Administered 2024-01-27: 40 mg via SUBCUTANEOUS
  Filled 2024-01-27: qty 0.4

## 2024-01-27 MED ORDER — DOXYCYCLINE HYCLATE 100 MG PO TABS: 100.0000 mg | ORAL_TABLET | Freq: Two times a day (BID) | ORAL | 0 refills | Status: AC

## 2024-01-27 MED ORDER — IBUPROFEN 400 MG PO TABS: 400.0000 mg | ORAL_TABLET | Freq: Four times a day (QID) | ORAL | 0 refills | Status: DC | PRN

## 2024-01-27 MED ORDER — ATORVASTATIN CALCIUM 40 MG PO TABS: 40.0000 mg | ORAL_TABLET | Freq: Every day | ORAL | Status: DC

## 2024-01-27 MED ORDER — DOXYCYCLINE HYCLATE 100 MG PO TABS: 100.0000 mg | ORAL_TABLET | Freq: Two times a day (BID) | ORAL | Status: DC

## 2024-01-27 MED ORDER — LISINOPRIL 20 MG PO TABS: 10.0000 mg | ORAL_TABLET | Freq: Every day | ORAL | Status: DC

## 2024-01-27 NOTE — Plan of Care (Signed)
Problem: Education: Goal: Knowledge of General Education information will improve Description: Including pain rating scale, medication(s)/side effects and non-pharmacologic comfort measures Outcome: Progressing   Problem: Health Behavior/Discharge Planning: Goal: Ability to manage health-related needs will improve Outcome: Progressing   Problem: Clinical Measurements: Goal: Ability to maintain clinical measurements within normal limits will improve Outcome: Progressing Goal: Will remain free from infection Outcome: Progressing Goal: Diagnostic test results will improve Outcome: Progressing Goal: Respiratory complications will improve Outcome: Progressing Goal: Cardiovascular complication will be avoided Outcome: Progressing   Problem: Activity: Goal: Risk for activity intolerance will decrease Outcome: Progressing   Problem: Nutrition: Goal: Adequate nutrition will be maintained Outcome: Progressing   Problem: Coping: Goal: Level of anxiety will decrease Outcome: Progressing   Problem: Elimination: Goal: Will not experience complications related to bowel motility Outcome: Progressing Goal: Will not experience complications related to urinary retention Outcome: Progressing   Problem: Pain Managment: Goal: General experience of comfort will improve and/or be controlled Outcome: Progressing   Problem: Safety: Goal: Ability to remain free from injury will improve Outcome: Progressing   Problem: Skin Integrity: Goal: Risk for impaired skin integrity will decrease Outcome: Progressing   Problem: Clinical Measurements: Goal: Ability to avoid or minimize complications of infection will improve Outcome: Progressing   Problem: Skin Integrity: Goal: Skin integrity will improve Outcome: Progressing   Problem: Clinical Measurements: Goal: Ability to avoid or minimize complications of infection will improve Outcome: Progressing   Problem: Skin Integrity: Goal: Skin  integrity will improve Outcome: Progressing

## 2024-01-27 NOTE — Discharge Summary (Signed)
DISCHARGE SUMMARY  Sheryl White  MR#: [id number]  DOB:1976/01/26  Date of Admission: 01/25/2024 Date of Discharge: 01/27/2024  Attending Physician:Janyah Singleterry Silvestre Gunner, MD  Patient's FAO:[id number], Binnie Rail, FNP  Disposition: Discharge home  Follow-up Appts:  Follow-up Information     Netta Cedars, MD Follow up on 01/28/2024.   Specialty: Orthopedic Surgery Why: Keep your appointment as instructed by the Orthopedic Team. Contact information: [address] [place] [address] [phone]         Park Meo, FNP Follow up in 1 week(s).   Specialty: Family Medicine Contact information: [address] [place] [address] [phone]                 Tests Needing Follow-up: -Monitor culture from left elbow I&D to ensure antibiotic coverage is appropriate -assess BP control on newly resumed lisinopril   Discharge Diagnoses: Left upper extremity cellulitis - left olecranon bursitis with abscess DM 1 Mild hypokalemia HTN  Initial presentation: 48 year old with a history of tobacco abuse, HTN, and DM 1 managed via insulin pump who presented to the ER with redness of the left elbow which over the course of just a few days progressed to include her entire right arm.  She was seen by her primary care doctor the day before her presentation, given a shot of Rocephin, and started on Cipro and Augmentin.  When the erythema progressed she presented to the ER for evaluation.   Hospital Course:  Left upper extremity cellulitis -left olecranon bursitis with abscess Failed to improve on outpatient antibiotics with worsening induration erythema and swelling - the patient underwent a left elbow I&D at bedside 2/12 with 5-10 cc of bloody fluid obtained and sent for culture -culture noting small number of Staph aureus at time of discharge -clinically dramatically improved status post I&D and IV antibiotics - transitioned to oral doxycycline at time  of discharge with plan to complete prolonged course of therapy -has a follow-up appointment the day after discharge in the orthopedic office   DM 1 Continue insulin pump as per protocol - CBG well-controlled during this admission   Mild hypokalemia Likely simply due to poor intake -supplemented    HTN Resume previously prescribed ACE inhibitor which patient was not taking prior to this admission  Allergies as of 01/27/2024       Reactions   Tape Rash        Medication List     TAKE these medications    atorvastatin 40 MG tablet Commonly known as: LIPITOR Take 1 tablet (40 mg total) by mouth daily.   Dexcom G7 Sensor Misc Use 1 each every 10 (ten) days   doxycycline 100 MG tablet Commonly known as: VIBRA-TABS Take 1 tablet (100 mg total) by mouth every 12 (twelve) hours for 10 days.   HYDROcodone-acetaminophen 5-325 MG tablet Commonly known as: NORCO/VICODIN Take 1 tablet by mouth every 6 (six) hours as needed for moderate pain (pain score 4-6).   ibuprofen 400 MG tablet Commonly known as: ADVIL Take 1 tablet (400 mg total) by mouth every 6 (six) hours as needed for mild pain (pain score 1-3) (or Fever >/= 101).   insulin pump Soln Inject 1 each into the skin 3 times daily with meals, bedtime and 2 AM.   lisinopril 10 MG tablet Commonly known as: ZESTRIL Take 10 mg by mouth daily. What changed: Another medication with the same name was removed. Continue taking this medication, and follow the  directions you see here.   NovoLOG 100 UNIT/ML injection Generic drug: insulin aspart   Omnipod 5 DexG7G6 Pods Gen 5 Misc INJECT 1 POD SUBCUTANEOUSLY EVERY OTHER DAY   Vitamin D-1000 Max St 25 MCG (1000 UT) tablet Generic drug: Cholecalciferol Take by mouth.   Cholecalciferol 1.25 MG (50000 UT) capsule Take 1 capsule by mouth once a week.        Day of Discharge BP 136/61   Pulse 70   Temp 98 F (36.7 C)   Resp 18   Ht 5\' 6"  (1.676 m)   Wt 95 kg   LMP  02/10/2012   SpO2 96%   BMI 33.80 kg/m   Physical Exam: General: No acute respiratory distress Lungs: Clear to auscultation bilaterally without wheezes or crackles Cardiovascular: Regular rate and rhythm without murmur gallop or rub normal S1 and S2 Abdomen: Nontender, nondistended, soft, bowel sounds positive, no rebound, no ascites, no appreciable mass Extremities: No significant cyanosis, clubbing, or edema bilateral lower extremities - RUE with minimal erythema around elbow - 2+ radial pulse on R - full range of motion of RUE - elbow dressed and dry   Basic Metabolic Panel: Recent Labs  Lab 01/25/24 1808 01/26/24 0435  NA 131* 136  K 3.7 3.4*  CL 98 102  CO2 28 22  GLUCOSE 231* 102*  BUN 8 9  CREATININE 0.76 0.59  CALCIUM 8.9 8.7*    CBC: Recent Labs  Lab 01/25/24 1808 01/26/24 0435  WBC 8.8 8.8  NEUTROABS 6.4  --   HGB 14.4 13.6  HCT 42.0 38.9  MCV 90.7 89.4  PLT 349 336    Recent Results (from the past 240 hours)  Culture, blood (Routine X 2) w Reflex to ID Panel     Status: None (Preliminary result)   Collection Time: 01/25/24  6:07 PM   Specimen: BLOOD  Result Value Ref Range Status   Specimen Description   Final    BLOOD RIGHT ANTECUBITAL Performed at [facility], [address], [place], [place] [address]    Special Requests   Final    NONE Performed at [facility], [address] [place], [place] [address]    Culture   Final    NO GROWTH < 24 HOURS Performed at [facility], [address] [place], [place] [address]    Report Status PENDING  Incomplete  Culture, blood (Routine X 2) w Reflex to ID Panel     Status: None (Preliminary result)   Collection Time: 01/25/24 10:15 PM   Specimen: BLOOD RIGHT HAND  Result Value Ref Range Status   Specimen Description   Final    BLOOD RIGHT HAND Performed at [facility], [address] [place], [place] [address]    Special Requests   Final     NONE Performed at [facility], [address], [place], [place] [address]    Culture   Final    NO GROWTH < 24 HOURS Performed at [facility], [address], [place], [place] [address]    Report Status PENDING  Incomplete  Aerobic Culture w Gram Stain (superficial specimen)     Status: None (Preliminary result)   Collection Time: 01/26/24  3:45 PM   Specimen: Cyst  Result Value Ref Range Status   Specimen Description CYSTS  Final   Special Requests SYN BURSA  Final   Gram Stain NO WBC SEEN RARE GRAM POSITIVE COCCI IN PAIRS  Final   Culture   Final    FEW STAPHYLOCOCCUS AUREUS CULTURE REINCUBATED FOR BETTER GROWTH Performed at [facility], [address] [place], [place] [address]    Report Status PENDING  Incomplete      Time spent in discharge (includes decision making & examination of pt): 30 minutes  01/27/2024, 2:36 PM   Lonia Blood, MD [facility] Office  [phone]

## 2024-01-27 NOTE — Plan of Care (Signed)
Problem: Education: Goal: Knowledge of General Education information will improve Description: Including pain rating scale, medication(s)/side effects and non-pharmacologic comfort measures 01/27/2024 1513 by Coy Saunas, RN Outcome: Adequate for Discharge 01/27/2024 1513 by Coy Saunas, RN Outcome: Adequate for Discharge   Problem: Health Behavior/Discharge Planning: Goal: Ability to manage health-related needs will improve 01/27/2024 1513 by Coy Saunas, RN Outcome: Adequate for Discharge 01/27/2024 1513 by Coy Saunas, RN Outcome: Adequate for Discharge   Problem: Clinical Measurements: Goal: Ability to maintain clinical measurements within normal limits will improve 01/27/2024 1513 by Coy Saunas, RN Outcome: Adequate for Discharge 01/27/2024 1513 by Coy Saunas, RN Outcome: Adequate for Discharge Goal: Will remain free from infection 01/27/2024 1513 by Coy Saunas, RN Outcome: Adequate for Discharge 01/27/2024 1513 by Coy Saunas, RN Outcome: Adequate for Discharge Goal: Diagnostic test results will improve 01/27/2024 1513 by Coy Saunas, RN Outcome: Adequate for Discharge 01/27/2024 1513 by Coy Saunas, RN Outcome: Adequate for Discharge Goal: Respiratory complications will improve 01/27/2024 1513 by Coy Saunas, RN Outcome: Adequate for Discharge 01/27/2024 1513 by Coy Saunas, RN Outcome: Adequate for Discharge Goal: Cardiovascular complication will be avoided 01/27/2024 1513 by Coy Saunas, RN Outcome: Adequate for Discharge 01/27/2024 1513 by Coy Saunas, RN Outcome: Adequate for Discharge   Problem: Activity: Goal: Risk for activity intolerance will decrease 01/27/2024 1513 by Coy Saunas, RN Outcome: Adequate for Discharge 01/27/2024 1513 by Coy Saunas, RN Outcome: Adequate for Discharge   Problem: Nutrition: Goal: Adequate nutrition will be maintained 01/27/2024 1513 by Coy Saunas, RN Outcome: Adequate for  Discharge 01/27/2024 1513 by Coy Saunas, RN Outcome: Adequate for Discharge   Problem: Coping: Goal: Level of anxiety will decrease 01/27/2024 1513 by Coy Saunas, RN Outcome: Adequate for Discharge 01/27/2024 1513 by Coy Saunas, RN Outcome: Adequate for Discharge   Problem: Elimination: Goal: Will not experience complications related to bowel motility 01/27/2024 1513 by Coy Saunas, RN Outcome: Adequate for Discharge 01/27/2024 1513 by Coy Saunas, RN Outcome: Adequate for Discharge Goal: Will not experience complications related to urinary retention 01/27/2024 1513 by Coy Saunas, RN Outcome: Adequate for Discharge 01/27/2024 1513 by Coy Saunas, RN Outcome: Adequate for Discharge   Problem: Pain Managment: Goal: General experience of comfort will improve and/or be controlled 01/27/2024 1513 by Coy Saunas, RN Outcome: Adequate for Discharge 01/27/2024 1513 by Coy Saunas, RN Outcome: Adequate for Discharge   Problem: Safety: Goal: Ability to remain free from injury will improve 01/27/2024 1513 by Coy Saunas, RN Outcome: Adequate for Discharge 01/27/2024 1513 by Coy Saunas, RN Outcome: Adequate for Discharge   Problem: Skin Integrity: Goal: Risk for impaired skin integrity will decrease 01/27/2024 1513 by Coy Saunas, RN Outcome: Adequate for Discharge 01/27/2024 1513 by Coy Saunas, RN Outcome: Adequate for Discharge   Problem: Clinical Measurements: Goal: Ability to avoid or minimize complications of infection will improve 01/27/2024 1513 by Coy Saunas, RN Outcome: Adequate for Discharge 01/27/2024 1513 by Coy Saunas, RN Outcome: Adequate for Discharge   Problem: Skin Integrity: Goal: Skin integrity will improve 01/27/2024 1513 by Coy Saunas, RN Outcome: Adequate for Discharge 01/27/2024 1513 by Coy Saunas, RN Outcome: Adequate for Discharge   Problem: Clinical Measurements: Goal: Ability to avoid or minimize  complications of infection will improve 01/27/2024 1513 by Coy Saunas, RN Outcome: Adequate for Discharge 01/27/2024 1513 by  Coy Saunas, RN Outcome: Adequate for Discharge   Problem: Skin Integrity: Goal: Skin integrity will improve 01/27/2024 1513 by Coy Saunas, RN Outcome: Adequate for Discharge 01/27/2024 1513 by Coy Saunas, RN Outcome: Adequate for Discharge

## 2024-01-28 ENCOUNTER — Telehealth: Payer: Self-pay

## 2024-01-28 NOTE — Transitions of Care (Post Inpatient/ED Visit) (Signed)
01/28/2024  Name: Sheryl White MRN: [id number] DOB: [birth year]  Today's TOC FU Call Status: Today's TOC FU Call Status:: Successful TOC FU Call Completed TOC FU Call Complete Date: 01/28/24 Patient's Name and Date of Birth confirmed.  Transition Care Management Follow-up Telephone Call Date of Discharge: 01/27/24 Discharge Facility: [facility]) Type of Discharge: Inpatient Admission Primary Inpatient Discharge Diagnosis:: cellulitis How have you been since you were released from the hospital?: Better Any questions or concerns?: No  Items Reviewed: Did you receive and understand the discharge instructions provided?: Yes Medications obtained,verified, and reconciled?: Yes (Medications Reviewed) Any new allergies since your discharge?: No Dietary orders reviewed?: Yes Do you have support at home?: Yes People in Home: parent(s)  Medications Reviewed Today: Medications Reviewed Today     Reviewed by Karena Addison, LPN (Licensed Practical Nurse) on 01/28/24 at [phone]  Med List Status: <None>   Medication Order Taking? Sig Documenting Provider Last Dose Status Informant  atorvastatin (LIPITOR) 40 MG tablet [id number] No Take 1 tablet (40 mg total) by mouth daily.  Patient not taking: Reported on 01/26/2024   Park Meo, FNP Not Taking Active Self, Pharmacy Records  Cholecalciferol (VITAMIN D-1000 MAX ST) 25 MCG (1000 UT) tablet [id number] No Take by mouth.  Patient not taking: Reported on 01/26/2024   [provider] Not Taking Active Self, Pharmacy Records  Cholecalciferol 1.25 MG (50000 UT) capsule [id number] No Take 1 capsule by mouth once a week.  Patient not taking: Reported on 01/26/2024   [provider] Not Taking Active Self, Pharmacy Records  Continuous Glucose Sensor (DEXCOM G7 SENSOR) MISC [id number] No Use 1 each every 10 (ten) days [provider] 01/25/2024 Active Self, Pharmacy Records  doxycycline (VIBRA-TABS) 100 MG tablet  [id number]  Take 1 tablet (100 mg total) by mouth every 12 (twelve) hours for 10 days. Lonia Blood, MD  Active   HYDROcodone-acetaminophen (NORCO/VICODIN) 5-325 MG tablet [id number] No Take 1 tablet by mouth every 6 (six) hours as needed for moderate pain (pain score 4-6). Bernadette Hoit, MD 01/26/2024 Morning Active Self, Pharmacy Records  ibuprofen (ADVIL) 400 MG tablet [id number]  Take 1 tablet (400 mg total) by mouth every 6 (six) hours as needed for mild pain (pain score 1-3) (or Fever >/= 101). Lonia Blood, MD  Active   Insulin Disposable Pump (OMNIPOD 5 DEXG7G6 PODS GEN 5) MISC [id number] No INJECT 1 POD SUBCUTANEOUSLY EVERY OTHER DAY [provider] 01/25/2024 Evening Active Self, Pharmacy Records  Insulin Human (INSULIN PUMP) SOLN [id number] No Inject 1 each into the skin 3 times daily with meals, bedtime and 2 AM. Rhetta Mura, MD Past Week Active Self, Pharmacy Records  lisinopril (ZESTRIL) 10 MG tablet [id number] No Take 10 mg by mouth daily.  Patient not taking: Reported on 01/26/2024   [provider] Not Taking Active Self, Pharmacy Records  NOVOLOG 100 UNIT/ML injection [id number] No  [provider] Past Week Active Self, Pharmacy Records            Home Care and Equipment/Supplies: Were Home Health Services Ordered?: NA Any new equipment or medical supplies ordered?: NA  Functional Questionnaire: Do you need assistance with bathing/showering or dressing?: No Do you need assistance with meal preparation?: No Do you need assistance with eating?: No Do you have difficulty maintaining continence: No Do you need assistance with getting out of bed/getting out of a chair/moving?: No Do you have difficulty managing or taking your medications?: No  Follow up appointments  reviewed: PCP Follow-up appointment confirmed?: Yes Date of PCP follow-up appointment?: 02/03/24 Follow-up Provider: [facility] Follow-up appointment  confirmed?: Yes Date of Specialist follow-up appointment?: 01/28/24 Follow-Up Specialty Provider:: ortho Do you need transportation to your follow-up appointment?: No Do you understand care options if your condition(s) worsen?: Yes-patient verbalized understanding    SIGNATURE Karena Addison, LPN [facility] Nurse Health Advisor Direct Dial [phone]

## 2024-01-29 LAB — AEROBIC CULTURE W GRAM STAIN (SUPERFICIAL SPECIMEN): Gram Stain: NONE SEEN

## 2024-01-31 LAB — CULTURE, BLOOD (ROUTINE X 2)
Culture: NO GROWTH
Culture: NO GROWTH

## 2024-02-03 ENCOUNTER — Inpatient Hospital Stay: Payer: 59 | Admitting: Family Medicine

## 2024-02-07 ENCOUNTER — Inpatient Hospital Stay: Payer: 59 | Admitting: Family Medicine

## 2024-02-09 ENCOUNTER — Other Ambulatory Visit: Payer: Self-pay

## 2024-02-09 ENCOUNTER — Encounter (HOSPITAL_BASED_OUTPATIENT_CLINIC_OR_DEPARTMENT_OTHER): Payer: Self-pay | Admitting: Orthopedic Surgery

## 2024-02-10 ENCOUNTER — Encounter: Payer: Self-pay | Admitting: Family Medicine

## 2024-02-10 ENCOUNTER — Ambulatory Visit (INDEPENDENT_AMBULATORY_CARE_PROVIDER_SITE_OTHER): Payer: 59 | Admitting: Family Medicine

## 2024-02-10 VITALS — BP 130/68 | HR 78 | Temp 97.6°F | Ht 66.0 in | Wt 205.0 lb

## 2024-02-10 DIAGNOSIS — L03114 Cellulitis of left upper limb: Secondary | ICD-10-CM | POA: Diagnosis not present

## 2024-02-10 DIAGNOSIS — M71022 Abscess of bursa, left elbow: Secondary | ICD-10-CM | POA: Diagnosis not present

## 2024-02-10 DIAGNOSIS — I10 Essential (primary) hypertension: Secondary | ICD-10-CM

## 2024-02-10 DIAGNOSIS — E109 Type 1 diabetes mellitus without complications: Secondary | ICD-10-CM | POA: Diagnosis not present

## 2024-02-10 DIAGNOSIS — Z79899 Other long term (current) drug therapy: Secondary | ICD-10-CM | POA: Insufficient documentation

## 2024-02-10 NOTE — Assessment & Plan Note (Signed)
 Stable on Bactrim currently. Follows up with ortho next week for possible washout. Advised to seek medical care for worsening swelling, erythema, drainage, fever, chills, body aches, N/V.

## 2024-02-10 NOTE — Assessment & Plan Note (Signed)
 Complete course of Bactrim. Resume Lisinopril when course completed as directed by Ortho. Resume Atorvastatin.

## 2024-02-10 NOTE — Assessment & Plan Note (Signed)
 Remains well controlled per pt. Continue current regimen. Keep appt with Endo next week.

## 2024-02-10 NOTE — Assessment & Plan Note (Signed)
 Remains well controlled while currently holding Lisinopril. Resume after Bactrim course completed. Seek medical care for chest pain, palpitations, shortness of breath with exertion, dizziness/lightheadedness, vision changes, recurrent headaches, or swelling of extremities.

## 2024-02-10 NOTE — Progress Notes (Signed)
 Subjective:  HPI: Sheryl White is a 48 y.o. female presenting on 02/10/2024 for Follow-up [facility] FOLLOW UP )   HPI Patient is in today for hospital follow up. She was hospitalized from 01/25/2024 to 01/27/2024 for left upper extremity staph cellulitis, more specifically left olecranon bursitis with abscess. She was treated with IV inpatinet antibiotics sent home with Doycycline for 2 weeks. She was seen by Orthopedics on 02/08/2024 and was switched to Bactrim due to GI upset. If antibiotics ineffective, scheduled for irrigation and excision next week on 3/5. She is not currently taking her Atorvastatin or Lisinopril due to interactions between bactrim and lisinopril (she could not remember which to hold) She reports today her elbow continues to have mild swelling with mild purulent drainage. She denies fever, chills, body aches, nausea, vomiting.  Her blood sugars have been 130-200s and she reports well controlled.    Review of Systems  All other systems reviewed and are negative.   Relevant past medical history reviewed and updated as indicated.   Past Medical History:  Diagnosis Date   Angina    Anxiety    Depression    Diabetes mellitus    Dyslipidemia    Endometriosis    Essential hypertension 03/14/2022   GERD (gastroesophageal reflux disease)    Ovarian cyst    Type I diabetes mellitus (HCC)      Past Surgical History:  Procedure Laterality Date   ABDOMINAL HYSTERECTOMY     still have left ovary   CARPAL TUNNEL RELEASE     bilateral   CESAREAN SECTION     x 1   COLONOSCOPY     ENDOMETRIAL ABLATION     LAPAROSCOPIC SALPINGOOPHERECTOMY     svd     x 1   TRIGGER FINGER RELEASE     bilateral   TUBAL LIGATION     UPPER GASTROINTESTINAL ENDOSCOPY     WISDOM TOOTH EXTRACTION      Allergies and medications reviewed and updated.   Current Outpatient Medications:    Continuous Glucose Sensor (DEXCOM G7 SENSOR) MISC, Use 1 each every 10 (ten) days, Disp: , Rfl:     HYDROcodone-acetaminophen (NORCO/VICODIN) 5-325 MG tablet, Take 1 tablet by mouth every 6 (six) hours as needed for moderate pain (pain score 4-6)., Disp: 30 tablet, Rfl: 0   ibuprofen (ADVIL) 400 MG tablet, Take 1 tablet (400 mg total) by mouth every 6 (six) hours as needed for mild pain (pain score 1-3) (or Fever >/= 101)., Disp: , Rfl:    Insulin Disposable Pump (OMNIPOD 5 DEXG7G6 PODS GEN 5) MISC, INJECT 1 POD SUBCUTANEOUSLY EVERY OTHER DAY, Disp: , Rfl:    Insulin Human (INSULIN PUMP) SOLN, Inject 1 each into the skin 3 times daily with meals, bedtime and 2 AM., Disp: , Rfl:    NOVOLOG 100 UNIT/ML injection, , Disp: , Rfl:    Sulfamethoxazole-Trimethoprim (BACTRIM PO), Take by mouth., Disp: , Rfl:    atorvastatin (LIPITOR) 40 MG tablet, Take 1 tablet (40 mg total) by mouth daily. (Patient not taking: Reported on 02/10/2024), Disp: 90 tablet, Rfl: 1   Cholecalciferol (VITAMIN D-1000 MAX ST) 25 MCG (1000 UT) tablet, Take by mouth. (Patient not taking: Reported on 01/26/2024), Disp: , Rfl:    Cholecalciferol 1.25 MG (50000 UT) capsule, Take 1 capsule by mouth once a week. (Patient not taking: Reported on 01/26/2024), Disp: , Rfl:    lisinopril (ZESTRIL) 10 MG tablet, Take 10 mg by mouth daily. (Patient not taking: Reported  on 01/26/2024), Disp: , Rfl:   Allergies  Allergen Reactions   Tape Rash    Objective:   BP 130/68   Pulse 78   Temp 97.6 F (36.4 C) (Oral)   Ht 5\' 6"  (1.676 m)   Wt 205 lb (93 kg)   LMP 02/10/2012   PF 98 L/min   BMI 33.09 kg/m      02/10/2024    3:28 PM 02/09/2024   11:24 AM 01/27/2024    7:40 AM  Vitals with BMI  Height 5\' 6"  5\' 6"    Weight 205 lbs 200 lbs   BMI 33.1 32.3   Systolic 130  136  Diastolic 68  61  Pulse 78  70     Physical Exam Vitals and nursing note reviewed.  Constitutional:      Appearance: Normal appearance. She is normal weight.  HENT:     Head: Normocephalic and atraumatic.  Musculoskeletal:     Left elbow: Swelling present.      Comments: Mild purulent drainage  Skin:    General: Skin is warm and dry.  Neurological:     General: No focal deficit present.     Mental Status: She is alert and oriented to person, place, and time. Mental status is at baseline.  Psychiatric:        Mood and Affect: Mood normal.        Behavior: Behavior normal.        Thought Content: Thought content normal.        Judgment: Judgment normal.     Assessment & Plan:  Abscess of bursa of left elbow Assessment & Plan: Stable on Bactrim currently. Follows up with ortho next week for possible washout. Advised to seek medical care for worsening swelling, erythema, drainage, fever, chills, body aches, N/V.   Type 1 diabetes mellitus without complication (HCC) Assessment & Plan: Remains well controlled per pt. Continue current regimen. Keep appt with Endo next week.    Essential hypertension Assessment & Plan: Remains well controlled while currently holding Lisinopril. Resume after Bactrim course completed. Seek medical care for chest pain, palpitations, shortness of breath with exertion, dizziness/lightheadedness, vision changes, recurrent headaches, or swelling of extremities.     Encounter for medication management Assessment & Plan: Complete course of Bactrim. Resume Lisinopril when course completed as directed by Ortho. Resume Atorvastatin.      Follow up plan: Return if symptoms worsen or fail to improve, for as scheduled.  Park Meo, FNP

## 2024-02-16 ENCOUNTER — Ambulatory Visit (HOSPITAL_BASED_OUTPATIENT_CLINIC_OR_DEPARTMENT_OTHER): Admission: RE | Admit: 2024-02-16 | Payer: 59 | Source: Home / Self Care | Admitting: Orthopedic Surgery

## 2024-02-16 DIAGNOSIS — Z01818 Encounter for other preprocedural examination: Secondary | ICD-10-CM

## 2024-02-16 SURGERY — IRRIGATION AND DEBRIDEMENT WOUND
Anesthesia: Monitor Anesthesia Care | Laterality: Left

## 2024-03-06 ENCOUNTER — Other Ambulatory Visit: Payer: Self-pay | Admitting: Family Medicine

## 2024-03-06 MED ORDER — ALBUTEROL SULFATE HFA 108 (90 BASE) MCG/ACT IN AERS: 2.0000 | INHALATION_SPRAY | Freq: Four times a day (QID) | RESPIRATORY_TRACT | 0 refills | Status: DC | PRN

## 2024-03-28 ENCOUNTER — Other Ambulatory Visit: Payer: Self-pay | Admitting: Family Medicine

## 2024-04-22 ENCOUNTER — Other Ambulatory Visit: Payer: Self-pay | Admitting: Family Medicine

## 2024-04-28 ENCOUNTER — Encounter: Payer: Self-pay | Admitting: Family Medicine

## 2024-06-30 ENCOUNTER — Other Ambulatory Visit: Payer: 59

## 2024-07-03 ENCOUNTER — Encounter: Payer: Self-pay | Admitting: Family Medicine

## 2024-07-03 ENCOUNTER — Ambulatory Visit: Admitting: Family Medicine

## 2024-07-03 VITALS — BP 122/75 | HR 81 | Temp 98.2°F | Ht 66.0 in | Wt 212.8 lb

## 2024-07-03 DIAGNOSIS — E1065 Type 1 diabetes mellitus with hyperglycemia: Secondary | ICD-10-CM | POA: Diagnosis not present

## 2024-07-03 DIAGNOSIS — E785 Hyperlipidemia, unspecified: Secondary | ICD-10-CM

## 2024-07-03 DIAGNOSIS — G8929 Other chronic pain: Secondary | ICD-10-CM

## 2024-07-03 DIAGNOSIS — K219 Gastro-esophageal reflux disease without esophagitis: Secondary | ICD-10-CM | POA: Diagnosis not present

## 2024-07-03 DIAGNOSIS — E559 Vitamin D deficiency, unspecified: Secondary | ICD-10-CM | POA: Diagnosis not present

## 2024-07-03 DIAGNOSIS — Z1231 Encounter for screening mammogram for malignant neoplasm of breast: Secondary | ICD-10-CM

## 2024-07-03 DIAGNOSIS — Z135 Encounter for screening for eye and ear disorders: Secondary | ICD-10-CM

## 2024-07-03 DIAGNOSIS — I152 Hypertension secondary to endocrine disorders: Secondary | ICD-10-CM

## 2024-07-03 DIAGNOSIS — F17210 Nicotine dependence, cigarettes, uncomplicated: Secondary | ICD-10-CM | POA: Diagnosis not present

## 2024-07-03 DIAGNOSIS — M25561 Pain in right knee: Secondary | ICD-10-CM

## 2024-07-03 DIAGNOSIS — E1169 Type 2 diabetes mellitus with other specified complication: Secondary | ICD-10-CM

## 2024-07-03 DIAGNOSIS — E1159 Type 2 diabetes mellitus with other circulatory complications: Secondary | ICD-10-CM

## 2024-07-03 DIAGNOSIS — E782 Mixed hyperlipidemia: Secondary | ICD-10-CM

## 2024-07-03 MED ORDER — METHOCARBAMOL 750 MG PO TABS: 750.0000 mg | ORAL_TABLET | Freq: Four times a day (QID) | ORAL | 0 refills | Status: AC

## 2024-07-03 NOTE — Progress Notes (Addendum)
 Subjective:  HPI: Sheryl White is a 48 y.o. female presenting on 07/03/2024 for Medical Management of Chronic Issues (6 month chronic f/u /Request Cortizone shot in rt  knee x 3 days knee is swollen, hard to bend, extreme pain causing imbalance./ Back spasms .)   HPI Patient is in today for follow up for chronic condition management. PMH includes anxiety, depression, DM1, GERD, HTN, HLD. Sheryl White is followed by gastroenterology and endocrinology as well as OBGYN. No labs drawn prior to this OV. She reports today conditions are well controlled. Other concerns include mid back right sided muscle spasms that are intermittent and flaring up. She has been treated with Robaxin  in past with effective control. Denies injury or trauma, saddle numbness, incontinence of urine or stool.  Mammogram UTD per pt, she is due for PAP, Hep B, Shingles, PNA vaccines, and eye exam.   Anxiety and depression well controlled without medication.   DM1 on OMNIPOD, followed by Endo Sheryl White, insulin  pump. Last A1c 9.4%, GMI 7.9%   GERD well controlled on protonix  40mg  daily and Famotidine  40mg  nightly.  Needs EGD to screen for Barretts and colonoscopy, financial barriers. Followed by GI. Known 3cm hiatal hernia.    HTN/HLD: not taking Lisinopril  10mg  daily or Atorvastatin  20mg  daily.   Review of Systems  Eyes: Negative.   Respiratory: Negative.    Cardiovascular: Negative.   Musculoskeletal:  Positive for back pain and joint pain.  Endo/Heme/Allergies: Negative.     Relevant past medical history reviewed and updated as indicated.   Past Medical History:  Diagnosis Date   Angina    Anxiety    Depression    Diabetes mellitus    Dyslipidemia    Endometriosis    Essential hypertension 03/14/2022   GERD (gastroesophageal reflux disease)    Ovarian cyst    Type I diabetes mellitus (HCC)      Past Surgical History:  Procedure Laterality Date   ABDOMINAL HYSTERECTOMY     still have left ovary    CARPAL TUNNEL RELEASE     bilateral   CESAREAN SECTION     x 1   COLONOSCOPY     ENDOMETRIAL ABLATION     LAPAROSCOPIC SALPINGOOPHERECTOMY     svd     x 1   TRIGGER FINGER RELEASE     bilateral   TUBAL LIGATION     UPPER GASTROINTESTINAL ENDOSCOPY     WISDOM TOOTH EXTRACTION      Allergies and medications reviewed and updated.   Current Outpatient Medications:    Continuous Glucose Sensor (DEXCOM G7 SENSOR) MISC, Use 1 each every 10 (ten) days, Disp: , Rfl:    Insulin  Disposable Pump (OMNIPOD 5 DEXG7G6 PODS GEN 5) MISC, INJECT 1 POD SUBCUTANEOUSLY EVERY OTHER DAY, Disp: , Rfl:    Insulin  Human (INSULIN  PUMP) SOLN, Inject 1 each into the skin 3 times daily with meals, bedtime and 2 AM., Disp: , Rfl:    methocarbamol  (ROBAXIN ) 750 MG tablet, Take 1 tablet (750 mg total) by mouth 4 (four) times daily., Disp: 30 tablet, Rfl: 0   NOVOLOG  100 UNIT/ML injection, , Disp: , Rfl:    albuterol  (VENTOLIN  HFA) 108 (90 Base) MCG/ACT inhaler, TAKE 2 PUFFS BY MOUTH EVERY 6 HOURS AS NEEDED FOR WHEEZE OR SHORTNESS OF BREATH (Patient not taking: Reported on 07/03/2024), Disp: 18 each, Rfl: 0   atorvastatin  (LIPITOR) 40 MG tablet, Take 1 tablet (40 mg total) by mouth daily. (Patient not taking: Reported on 07/03/2024),  Disp: 90 tablet, Rfl: 1   Cholecalciferol (VITAMIN D -1000 MAX ST) 25 MCG (1000 UT) tablet, Take by mouth. (Patient not taking: Reported on 07/03/2024), Disp: , Rfl:    Cholecalciferol 1.25 MG (50000 UT) capsule, Take 1 capsule by mouth once a week. (Patient not taking: Reported on 07/03/2024), Disp: , Rfl:    lisinopril  (ZESTRIL ) 10 MG tablet, Take 10 mg by mouth daily. (Patient not taking: Reported on 07/03/2024), Disp: , Rfl:   Allergies  Allergen Reactions   Tape Rash    Objective:   BP 122/75   Pulse 81   Temp 98.2 F (36.8 C)   Ht 5' 6 (1.676 m)   Wt 212 lb 12.8 oz (96.5 kg)   LMP 02/10/2012   SpO2 98%   BMI 34.35 kg/m      07/03/2024    3:48 PM 02/10/2024    3:28 PM  02/09/2024   11:24 AM  Vitals with BMI  Height 5' 6 5' 6 5' 6  Weight 212 lbs 13 oz 205 lbs 200 lbs  BMI 34.36 33.1 32.3  Systolic 122 130   Diastolic 75 68   Pulse 81 78      Physical Exam Vitals and nursing note reviewed.  Constitutional:      Appearance: Normal appearance. She is normal weight.  HENT:     Head: Normocephalic and atraumatic.  Cardiovascular:     Rate and Rhythm: Normal rate and regular rhythm.     Pulses: Normal pulses.     Heart sounds: Normal heart sounds.  Pulmonary:     Effort: Pulmonary effort is normal.     Breath sounds: Normal breath sounds.  Musculoskeletal:     Thoracic back: Normal.     Right knee: Swelling present. Decreased range of motion. Tenderness present.  Skin:    General: Skin is warm and dry.  Neurological:     General: No focal deficit present.     Mental Status: She is alert and oriented to person, place, and time. Mental status is at baseline.  Psychiatric:        Mood and Affect: Mood normal.        Behavior: Behavior normal.        Thought Content: Thought content normal.        Judgment: Judgment normal.     Assessment & Plan:  Type 1 diabetes mellitus with hyperglycemia (HCC) Assessment & Plan: Followed by Endo. Last A1c 9.4%. GMI 7.9%. Will recheck today. Foot exam and eye exam UTD. PNA UTD. Need uACR. Counseled on importance of diet and exercise. Keep appts with Endo.   Orders: -     CBC with Differential/Platelet -     Comprehensive metabolic panel with GFR -     Lipid panel -     Hemoglobin A1c -     Microalbumin / creatinine urine ratio -     Ambulatory referral to Ophthalmology  Hypertension associated with diabetes [facility]) Assessment & Plan: Well controlled, unmedicated. Labs today. Recommend heart healthy diet such as Mediterranean diet with whole grains, fruits, vegetable, fish, lean meats, nuts, and olive oil. Limit salt. Encouraged moderate walking, 3-5 times/week for 30-50 minutes each session. Aim for  at least 150 minutes.week. Goal should be pace of 3 miles/hours, or walking 1.5 miles in 30 minutes. Avoid tobacco products. Avoid excess alcohol. Take medications as prescribed and bring medications and blood pressure log with cuff to each office visit. Seek medical care for chest pain, palpitations, shortness  of breath with exertion, dizziness/lightheadedness, vision changes, recurrent headaches, or swelling of extremities. Follow up in 3 months.   Orders: -     CBC with Differential/Platelet -     Comprehensive metabolic panel with GFR -     Lipid panel  Mixed hyperlipidemia Assessment & Plan: Noncompliant with Atorvastatin . I recommend consuming a heart healthy diet such as Mediterranean diet or DASH diet with whole grains, fruits, vegetable, fish, lean meats, nuts, and olive oil. Limit sweets and processed foods. I also encourage moderate intensity exercise 150 minutes weekly. This is 3-5 times weekly for 30-50 minutes each session. Goal should be pace of 3 miles/hours, or walking 1.5 miles in 30 minutes. The ASCVD Risk score (Arnett DK, et al., 2019) failed to calculate for the following reasons:   Cannot find a previous HDL lab   Cannot find a previous total cholesterol lab   Orders: -     CBC with Differential/Platelet -     Comprehensive metabolic panel with GFR -     Lipid panel  Vitamin D  deficiency Assessment & Plan: Vitamin D  level today  Orders: -     VITAMIN D  25 Hydroxy (Vit-D Deficiency, Fractures)  Gastroesophageal reflux disease without esophagitis Assessment & Plan: Well controlled on Protonix  and Famotidine  Needs EGD and colonoscopy with GI. Cannot afford this at this time. No red flags on exam.   Orders: -     CBC with Differential/Platelet -     Comprehensive metabolic panel with GFR -     Lipid panel  Cigarette nicotine dependence without complication Assessment & Plan: 3-5 minute discussion regarding the harms of tobacco use, the benefits of cessation,  and methods of cessation. Discussed that there are medication options to help with cessation. Provided printed education on steps to quit smoking.    Chronic pain of right knee Assessment & Plan: No warmth or redness on exam, is seeing ortho for this. Symptoms unchanged from prior. Has had MRI in past with subsequent knee injections. Will return to [facility] for follow up management.    Screening for diabetic retinopathy -     Ambulatory referral to Ophthalmology  Screening mammogram for breast cancer -     3D Screening Mammogram, Left and Right  Other orders -     Methocarbamol ; Take 1 tablet (750 mg total) by mouth 4 (four) times daily.  Dispense: 30 tablet; Refill: 0     Follow up plan: Return in about 3 months (around 10/03/2024) for ASAP PAP and chronic follow-up with labs 1 week prior.  Jeoffrey GORMAN Barrio, FNP

## 2024-07-03 NOTE — Assessment & Plan Note (Signed)
 No warmth or redness on exam, is seeing ortho for this. Symptoms unchanged from prior. Has had MRI in past with subsequent knee injections. Will return to [facility] for follow up management.

## 2024-07-03 NOTE — Assessment & Plan Note (Signed)
 3-5 minute discussion regarding the harms of tobacco use, the benefits of cessation, and methods of cessation. Discussed that there are medication options to help with cessation. Provided printed education on steps to quit smoking.

## 2024-07-03 NOTE — Assessment & Plan Note (Signed)
 Well controlled, unmedicated. Labs today. Recommend heart healthy diet such as Mediterranean diet with whole grains, fruits, vegetable, fish, lean meats, nuts, and olive oil. Limit salt. Encouraged moderate walking, 3-5 times/week for 30-50 minutes each session. Aim for at least 150 minutes.week. Goal should be pace of 3 miles/hours, or walking 1.5 miles in 30 minutes. Avoid tobacco products. Avoid excess alcohol. Take medications as prescribed and bring medications and blood pressure log with cuff to each office visit. Seek medical care for chest pain, palpitations, shortness of breath with exertion, dizziness/lightheadedness, vision changes, recurrent headaches, or swelling of extremities. Follow up in 3 months.

## 2024-07-03 NOTE — Addendum Note (Signed)
 Addended by: CORINNA JESUSA SAUNDERS on: 07/03/2024 04:32 PM   Modules accepted: Orders

## 2024-07-03 NOTE — Assessment & Plan Note (Signed)
 Well controlled on Protonix  and Famotidine  Needs EGD and colonoscopy with GI. Cannot afford this at this time. No red flags on exam.

## 2024-07-03 NOTE — Assessment & Plan Note (Signed)
 Vitamin D level today

## 2024-07-03 NOTE — Assessment & Plan Note (Signed)
 Followed by Endo. Last A1c 9.4%. GMI 7.9%. Will recheck today. Foot exam and eye exam UTD. PNA UTD. Need uACR. Counseled on importance of diet and exercise. Keep appts with Endo.

## 2024-07-03 NOTE — Assessment & Plan Note (Signed)
 Noncompliant with Atorvastatin . I recommend consuming a heart healthy diet such as Mediterranean diet or DASH diet with whole grains, fruits, vegetable, fish, lean meats, nuts, and olive oil. Limit sweets and processed foods. I also encourage moderate intensity exercise 150 minutes weekly. This is 3-5 times weekly for 30-50 minutes each session. Goal should be pace of 3 miles/hours, or walking 1.5 miles in 30 minutes. The ASCVD Risk score (Arnett DK, et al., 2019) failed to calculate for the following reasons:   Cannot find a previous HDL lab   Cannot find a previous total cholesterol lab

## 2024-07-04 ENCOUNTER — Ambulatory Visit: Payer: Self-pay | Admitting: Family Medicine

## 2024-07-04 LAB — CBC WITH DIFFERENTIAL/PLATELET
Absolute Lymphocytes: 2684 {cells}/uL (ref 850–3900)
Absolute Monocytes: 525 {cells}/uL (ref 200–950)
Basophils Absolute: 43 {cells}/uL (ref 0–200)
Basophils Relative: 0.7 %
Eosinophils Absolute: 256 {cells}/uL (ref 15–500)
Eosinophils Relative: 4.2 %
HCT: 43.3 % (ref 35.0–45.0)
Hemoglobin: 14.2 g/dL (ref 11.7–15.5)
MCH: 31 pg (ref 27.0–33.0)
MCHC: 32.8 g/dL (ref 32.0–36.0)
MCV: 94.5 fL (ref 80.0–100.0)
MPV: 9.9 fL (ref 7.5–12.5)
Monocytes Relative: 8.6 %
Neutro Abs: 2593 {cells}/uL (ref 1500–7800)
Neutrophils Relative %: 42.5 %
Platelets: 365 Thousand/uL (ref 140–400)
RBC: 4.58 Million/uL (ref 3.80–5.10)
RDW: 11.8 % (ref 11.0–15.0)
Total Lymphocyte: 44 %
WBC: 6.1 Thousand/uL (ref 3.8–10.8)

## 2024-07-04 LAB — COMPREHENSIVE METABOLIC PANEL WITH GFR
AG Ratio: 1.6 (calc) (ref 1.0–2.5)
ALT: 10 U/L (ref 6–29)
AST: 12 U/L (ref 10–35)
Albumin: 3.8 g/dL (ref 3.6–5.1)
Alkaline phosphatase (APISO): 75 U/L (ref 37–153)
BUN/Creatinine Ratio: 19 (calc) (ref 6–22)
BUN: 20 mg/dL (ref 7–25)
CO2: 29 mmol/L (ref 20–32)
Calcium: 9.2 mg/dL (ref 8.6–10.4)
Chloride: 107 mmol/L (ref 98–110)
Creat: 1.08 mg/dL — ABNORMAL HIGH (ref 0.50–1.03)
Globulin: 2.4 g/dL (ref 1.9–3.7)
Glucose, Bld: 173 mg/dL — ABNORMAL HIGH (ref 65–99)
Potassium: 4.6 mmol/L (ref 3.5–5.3)
Sodium: 140 mmol/L (ref 135–146)
Total Bilirubin: 0.2 mg/dL (ref 0.2–1.2)
Total Protein: 6.2 g/dL (ref 6.1–8.1)
eGFR: 63 mL/min/1.73m2 (ref >=60)

## 2024-07-04 LAB — LIPID PANEL
Cholesterol: 195 mg/dL (ref <200)
HDL: 70 mg/dL (ref >=50)
LDL Cholesterol (Calc): 107 mg/dL — ABNORMAL HIGH
Non-HDL Cholesterol (Calc): 125 mg/dL (ref <130)
Total CHOL/HDL Ratio: 2.8 (calc) (ref <5.0)
Triglycerides: 86 mg/dL (ref <150)

## 2024-07-04 LAB — MICROALBUMIN / CREATININE URINE RATIO
Creatinine, Urine: 89 mg/dL (ref 20–275)
Microalb Creat Ratio: 330 mg/g{creat} — ABNORMAL HIGH (ref <30)
Microalb, Ur: 29.4 mg/dL

## 2024-07-04 LAB — HEMOGLOBIN A1C
Hgb A1c MFr Bld: 9.1 % — ABNORMAL HIGH (ref <5.7)
Mean Plasma Glucose: 214 mg/dL
eAG (mmol/L): 11.9 mmol/L

## 2024-07-04 LAB — VITAMIN D 25 HYDROXY (VIT D DEFICIENCY, FRACTURES): Vit D, 25-Hydroxy: 33 ng/mL (ref 30–100)

## 2024-07-06 ENCOUNTER — Ambulatory Visit: Payer: 59 | Admitting: Family Medicine

## 2024-08-16 LAB — HM DIABETES EYE EXAM

## 2024-08-20 ENCOUNTER — Encounter: Payer: Self-pay | Admitting: Family Medicine

## 2024-08-21 ENCOUNTER — Other Ambulatory Visit: Payer: Self-pay | Admitting: Family Medicine

## 2024-08-21 ENCOUNTER — Ambulatory Visit: Payer: Self-pay | Admitting: Family Medicine

## 2024-08-21 DIAGNOSIS — H3500 Unspecified background retinopathy: Secondary | ICD-10-CM

## 2024-08-23 ENCOUNTER — Ambulatory Visit: Admitting: Family Medicine

## 2024-09-03 ENCOUNTER — Encounter: Payer: Self-pay | Admitting: Family Medicine

## 2024-09-05 ENCOUNTER — Ambulatory Visit (INDEPENDENT_AMBULATORY_CARE_PROVIDER_SITE_OTHER): Admitting: Family Medicine

## 2024-09-05 ENCOUNTER — Encounter: Payer: Self-pay | Admitting: Family Medicine

## 2024-09-05 VITALS — BP 138/72 | HR 78 | Temp 97.8°F | Ht 66.0 in | Wt 215.4 lb

## 2024-09-05 DIAGNOSIS — R052 Subacute cough: Secondary | ICD-10-CM | POA: Diagnosis not present

## 2024-09-05 DIAGNOSIS — J069 Acute upper respiratory infection, unspecified: Secondary | ICD-10-CM | POA: Insufficient documentation

## 2024-09-05 DIAGNOSIS — J029 Acute pharyngitis, unspecified: Secondary | ICD-10-CM

## 2024-09-05 NOTE — Assessment & Plan Note (Signed)
 Pt with recent viral URI. Cough is lingering. No s/s of bacterial infection. Reassured patient that symptoms and exam findings are most consistent with a subacute cough s/p viral upper respiratory infection and explained lack of efficacy of antibiotics against viruses.  Discussed expected course and features suggestive of secondary bacterial infection.  Continue supportive care. Increase fluid intake with water or electrolyte solution like pedialyte. Encouraged acetaminophen  as needed for fever/pain. Encouraged salt water gargling, chloraseptic spray and throat lozenges. Encouraged OTC guaifenesin. Encouraged saline sinus flushes and/or neti with humidified air.  Presence of pharyngeal exudate, strep test negative.

## 2024-09-05 NOTE — Progress Notes (Signed)
 Subjective:  HPI: Sheryl White is a 48 y.o. female presenting on 09/05/2024 for Acute Visit (Cough and congestion,x 3 weeks.  sore throat, head congestion, green mucus from nose x 1.5 weeks )   HPI Patient is in today for 3 weeks cough and congestion with sore throat. She does have new spots on her throat. Denies fever, chills, body aches, SOB, wheezing, pleurisy, N/V/D. Works for [employer] Has tried mucinex and nyquil Symptoms overall improving just lingering cough New onset pharyngeal exudate since yesterday with associated sore throat  Review of Systems  All other systems reviewed and are negative.   Relevant past medical history reviewed and updated as indicated.   Past Medical History:  Diagnosis Date   Angina    Anxiety    Depression    Diabetes mellitus    Dyslipidemia    Endometriosis    Essential hypertension 03/14/2022   GERD (gastroesophageal reflux disease)    Ovarian cyst    Type I diabetes mellitus (HCC)      Past Surgical History:  Procedure Laterality Date   ABDOMINAL HYSTERECTOMY     still have left ovary   CARPAL TUNNEL RELEASE     bilateral   CESAREAN SECTION     x 1   COLONOSCOPY     ENDOMETRIAL ABLATION     LAPAROSCOPIC SALPINGOOPHERECTOMY     svd     x 1   TRIGGER FINGER RELEASE     bilateral   TUBAL LIGATION     UPPER GASTROINTESTINAL ENDOSCOPY     WISDOM TOOTH EXTRACTION      Allergies and medications reviewed and updated.   Current Outpatient Medications:    albuterol  (VENTOLIN  HFA) 108 (90 Base) MCG/ACT inhaler, Inhale 1-2 puffs into the lungs every 6 (six) hours as needed for wheezing or shortness of breath., Disp: , Rfl:    ASPIRIN LOW DOSE 81 MG tablet, TAKE 1 TABLET BY MOUTH EVERY DAY AFTER DINNER, Disp: 120 tablet, Rfl: 3   Continuous Glucose Sensor (DEXCOM G7 SENSOR) MISC, Use 1 each every 10 (ten) days, Disp: , Rfl:    Insulin  Disposable Pump (OMNIPOD 5 DEXG7G6 PODS GEN 5) MISC, INJECT 1 POD  SUBCUTANEOUSLY EVERY OTHER DAY, Disp: , Rfl:    Insulin  Human (INSULIN  PUMP) SOLN, Inject 1 each into the skin 3 times daily with meals, bedtime and 2 AM., Disp: , Rfl:    methocarbamol  (ROBAXIN ) 750 MG tablet, Take 1 tablet (750 mg total) by mouth 4 (four) times daily. (Patient taking differently: Take 750 mg by mouth as needed for muscle spasms.), Disp: 30 tablet, Rfl: 0   NOVOLOG  100 UNIT/ML injection, , Disp: , Rfl:   Allergies  Allergen Reactions   Tape Rash    Objective:   BP 138/72   Pulse 78   Temp 97.8 F (36.6 C)   Ht 5' 6 (1.676 m)   Wt 215 lb 6.4 oz (97.7 kg)   LMP 02/10/2012   SpO2 98%   BMI 34.77 kg/m      09/05/2024    3:14 PM 07/03/2024    3:48 PM 02/10/2024    3:28 PM  Vitals with BMI  Height 5' 6 5' 6 5' 6  Weight 215 lbs 6 oz 212 lbs 13 oz 205 lbs  BMI 34.78 34.36 33.1  Systolic 138 122 869  Diastolic 72 75 68  Pulse 78 81 78     Physical Exam Vitals and nursing note reviewed.  Constitutional:  Appearance: Normal appearance. She is normal weight.  HENT:     Head: Normocephalic and atraumatic.     Right Ear: Tympanic membrane, ear canal and external ear normal.     Left Ear: Tympanic membrane, ear canal and external ear normal.     Nose: Nose normal.     Mouth/Throat:     Mouth: Mucous membranes are moist.     Pharynx: Oropharyngeal exudate present.  Eyes:     Extraocular Movements: Extraocular movements intact.     Conjunctiva/sclera: Conjunctivae normal.  Cardiovascular:     Rate and Rhythm: Normal rate and regular rhythm.     Pulses: Normal pulses.     Heart sounds: Normal heart sounds.  Pulmonary:     Effort: Pulmonary effort is normal.     Breath sounds: Normal breath sounds.  Musculoskeletal:     Cervical back: No tenderness.  Lymphadenopathy:     Cervical: No cervical adenopathy.  Skin:    General: Skin is warm and dry.  Neurological:     General: No focal deficit present.     Mental Status: She is alert and oriented to  person, place, and time. Mental status is at baseline.  Psychiatric:        Mood and Affect: Mood normal.        Behavior: Behavior normal.        Thought Content: Thought content normal.        Judgment: Judgment normal.     Assessment & Plan:  Subacute cough Assessment & Plan: Pt with recent viral URI. Cough is lingering. No s/s of bacterial infection. Reassured patient that symptoms and exam findings are most consistent with a subacute cough s/p viral upper respiratory infection and explained lack of efficacy of antibiotics against viruses.  Discussed expected course and features suggestive of secondary bacterial infection.  Continue supportive care. Increase fluid intake with water or electrolyte solution like pedialyte. Encouraged acetaminophen  as needed for fever/pain. Encouraged salt water gargling, chloraseptic spray and throat lozenges. Encouraged OTC guaifenesin. Encouraged saline sinus flushes and/or neti with humidified air.  Presence of pharyngeal exudate, strep test negative.     Sore throat -     STREP GROUP A AG, W/REFLEX TO CULT     Follow up plan: Return if symptoms worsen or fail to improve.  Jeoffrey GORMAN Barrio, FNP

## 2024-09-06 ENCOUNTER — Ambulatory Visit: Payer: Self-pay | Admitting: Family Medicine

## 2024-09-07 LAB — CULTURE, GROUP A STREP
Micro Number: 17004870
SPECIMEN QUALITY:: ADEQUATE

## 2024-09-07 LAB — STREP GROUP A AG, W/REFLEX TO CULT: Streptococcus Group A AG: NOT DETECTED

## 2024-09-22 ENCOUNTER — Other Ambulatory Visit

## 2024-09-22 DIAGNOSIS — I10 Essential (primary) hypertension: Secondary | ICD-10-CM

## 2024-09-22 DIAGNOSIS — E1169 Type 2 diabetes mellitus with other specified complication: Secondary | ICD-10-CM

## 2024-09-22 DIAGNOSIS — E559 Vitamin D deficiency, unspecified: Secondary | ICD-10-CM

## 2024-09-22 DIAGNOSIS — E1065 Type 1 diabetes mellitus with hyperglycemia: Secondary | ICD-10-CM

## 2024-09-27 ENCOUNTER — Other Ambulatory Visit

## 2024-09-27 DIAGNOSIS — I10 Essential (primary) hypertension: Secondary | ICD-10-CM

## 2024-09-27 DIAGNOSIS — E1065 Type 1 diabetes mellitus with hyperglycemia: Secondary | ICD-10-CM

## 2024-09-28 LAB — COMPREHENSIVE METABOLIC PANEL WITH GFR
AG Ratio: 1.6 (calc) (ref 1.0–2.5)
ALT: 51 U/L — ABNORMAL HIGH (ref 6–29)
AST: 57 U/L — ABNORMAL HIGH (ref 10–35)
Albumin: 3.9 g/dL (ref 3.6–5.1)
Alkaline phosphatase (APISO): 86 U/L (ref 37–153)
BUN/Creatinine Ratio: 13 (calc) (ref 6–22)
BUN: 15 mg/dL (ref 7–25)
CO2: 28 mmol/L (ref 20–32)
Calcium: 8.7 mg/dL (ref 8.6–10.4)
Chloride: 102 mmol/L (ref 98–110)
Creat: 1.12 mg/dL — ABNORMAL HIGH (ref 0.50–1.03)
Globulin: 2.4 g/dL (ref 1.9–3.7)
Glucose, Bld: 164 mg/dL — ABNORMAL HIGH (ref 65–99)
Potassium: 4.4 mmol/L (ref 3.5–5.3)
Sodium: 136 mmol/L (ref 135–146)
Total Bilirubin: 0.5 mg/dL (ref 0.2–1.2)
Total Protein: 6.3 g/dL (ref 6.1–8.1)
eGFR: 60 mL/min/1.73m2 (ref >=60)

## 2024-09-28 LAB — CBC WITH DIFFERENTIAL/PLATELET
Absolute Lymphocytes: 2081 {cells}/uL (ref 850–3900)
Absolute Monocytes: 672 {cells}/uL (ref 200–950)
Basophils Absolute: 37 {cells}/uL (ref 0–200)
Basophils Relative: 0.5 %
Eosinophils Absolute: 197 {cells}/uL (ref 15–500)
Eosinophils Relative: 2.7 %
HCT: 44 % (ref 35.0–45.0)
Hemoglobin: 14.6 g/dL (ref 11.7–15.5)
MCH: 31.5 pg (ref 27.0–33.0)
MCHC: 33.2 g/dL (ref 32.0–36.0)
MCV: 94.8 fL (ref 80.0–100.0)
MPV: 10.4 fL (ref 7.5–12.5)
Monocytes Relative: 9.2 %
Neutro Abs: 4314 {cells}/uL (ref 1500–7800)
Neutrophils Relative %: 59.1 %
Platelets: 333 Thousand/uL (ref 140–400)
RBC: 4.64 Million/uL (ref 3.80–5.10)
RDW: 11.6 % (ref 11.0–15.0)
Total Lymphocyte: 28.5 %
WBC: 7.3 Thousand/uL (ref 3.8–10.8)

## 2024-09-28 LAB — MICROALBUMIN / CREATININE URINE RATIO
Creatinine, Urine: 101 mg/dL (ref 20–275)
Microalb Creat Ratio: 173 mg/g{creat} — ABNORMAL HIGH (ref <30)
Microalb, Ur: 17.5 mg/dL

## 2024-10-02 ENCOUNTER — Ambulatory Visit: Payer: Self-pay | Admitting: Family Medicine

## 2024-10-03 ENCOUNTER — Ambulatory Visit: Admitting: Family Medicine

## 2024-10-03 ENCOUNTER — Encounter: Payer: Self-pay | Admitting: Family Medicine

## 2024-10-03 VITALS — BP 135/78 | HR 77 | Temp 98.0°F | Ht 66.0 in | Wt 214.8 lb

## 2024-10-03 DIAGNOSIS — Z23 Encounter for immunization: Secondary | ICD-10-CM

## 2024-10-03 DIAGNOSIS — I152 Hypertension secondary to endocrine disorders: Secondary | ICD-10-CM

## 2024-10-03 DIAGNOSIS — F17210 Nicotine dependence, cigarettes, uncomplicated: Secondary | ICD-10-CM

## 2024-10-03 DIAGNOSIS — Z794 Long term (current) use of insulin: Secondary | ICD-10-CM

## 2024-10-03 DIAGNOSIS — N182 Chronic kidney disease, stage 2 (mild): Secondary | ICD-10-CM

## 2024-10-03 DIAGNOSIS — E785 Hyperlipidemia, unspecified: Secondary | ICD-10-CM

## 2024-10-03 DIAGNOSIS — R7401 Elevation of levels of liver transaminase levels: Secondary | ICD-10-CM | POA: Diagnosis not present

## 2024-10-03 DIAGNOSIS — E1169 Type 2 diabetes mellitus with other specified complication: Secondary | ICD-10-CM

## 2024-10-03 DIAGNOSIS — E782 Mixed hyperlipidemia: Secondary | ICD-10-CM

## 2024-10-03 DIAGNOSIS — E1065 Type 1 diabetes mellitus with hyperglycemia: Secondary | ICD-10-CM

## 2024-10-03 DIAGNOSIS — E1122 Type 2 diabetes mellitus with diabetic chronic kidney disease: Secondary | ICD-10-CM | POA: Insufficient documentation

## 2024-10-03 NOTE — Progress Notes (Signed)
 Subjective:  HPI: Sheryl White is a 48 y.o. female presenting on 10/03/2024 for Medical Management of Chronic Issues (3 month chronic follow up. Flu shot today /Get lab from last week,/ Discuss meds. )   HPI Patient is in today for chronic condition management.  PMH includes anxiety, depression, DM1, GERD, HTN, HLD, CKD2 Followed by Endocrinology Other concerns include mucopurulent rhinorrhea, head congestion, and cough that comes and goes for over a month. Has not had antibiotics. Denies fever, chills, body aches. Has tried saline rinse.   Anxiety and depression well controlled without medication.   DM1 on OMNIPOD, followed by Endo O'Connell, insulin  pump. Last A1c 9.4%, GMI 7.9%, on bASA Denies polyuria, polydypsia, chest pain, paresthesias, or chest pain.    GERD well controlled on protonix  40mg  daily and Famotidine  40mg  nightly.  Needs EGD to screen for Barretts and colonoscopy, financial barriers. Followed by GI. Known 3cm hiatal hernia.  Denies hematemesis, melena, hematochezia, occult blood in stool, iron deficiency anemia, anorexia, unexplained weight loss, dysphagia, odynophagia, persistent vomiting    HTN/HLD: not taking Lisinopril  10mg  daily or Atorvastatin  20mg  daily. Denies chest pain, palpitations, recurrent headaches, vision changes, lightheadedness, dizziness, dyspnea on exertion, or swelling of extremities.   CKD2: avoiding nsaids and staying hydrated Lab Results  Component Value Date   CREATININE 1.12 (H) 09/27/2024   CREATININE 1.08 (H) 07/03/2024   CREATININE 0.59 01/26/2024   Transaminitis: experiencing urinary frequency and pressure while urinating the day labs were drawn, has seen OBGYN and UA showed hematuria   Review of Systems  All other systems reviewed and are negative.   Relevant past medical history reviewed and updated as indicated.   Past Medical History:  Diagnosis Date   Angina    Anxiety    Depression    Diabetes mellitus     Dyslipidemia    Endometriosis    Essential hypertension 03/14/2022   GERD (gastroesophageal reflux disease)    Ovarian cyst    Type I diabetes mellitus (HCC)      Past Surgical History:  Procedure Laterality Date   ABDOMINAL HYSTERECTOMY     still have left ovary   CARPAL TUNNEL RELEASE     bilateral   CESAREAN SECTION     x 1   COLONOSCOPY     ENDOMETRIAL ABLATION     LAPAROSCOPIC SALPINGOOPHERECTOMY     svd     x 1   TRIGGER FINGER RELEASE     bilateral   TUBAL LIGATION     UPPER GASTROINTESTINAL ENDOSCOPY     WISDOM TOOTH EXTRACTION      Allergies and medications reviewed and updated.   Current Outpatient Medications:    albuterol  (VENTOLIN  HFA) 108 (90 Base) MCG/ACT inhaler, Inhale 1-2 puffs into the lungs every 6 (six) hours as needed for wheezing or shortness of breath., Disp: , Rfl:    ASPIRIN LOW DOSE 81 MG tablet, TAKE 1 TABLET BY MOUTH EVERY DAY AFTER DINNER, Disp: 120 tablet, Rfl: 3   Continuous Glucose Sensor (DEXCOM G7 SENSOR) MISC, Use 1 each every 10 (ten) days, Disp: , Rfl:    Insulin  Disposable Pump (OMNIPOD 5 DEXG7G6 PODS GEN 5) MISC, INJECT 1 POD SUBCUTANEOUSLY EVERY OTHER DAY, Disp: , Rfl:    Insulin  Human (INSULIN  PUMP) SOLN, Inject 1 each into the skin 3 times daily with meals, bedtime and 2 AM., Disp: , Rfl:    methocarbamol  (ROBAXIN ) 750 MG tablet, Take 1 tablet (750 mg total) by mouth 4 (four) times  daily. (Patient taking differently: Take 750 mg by mouth as needed for muscle spasms.), Disp: 30 tablet, Rfl: 0   NOVOLOG  100 UNIT/ML injection, , Disp: , Rfl:   Allergies  Allergen Reactions   Tape Rash    Objective:   BP 135/78   Pulse 77   Temp 98 F (36.7 C)   Ht 5' 6 (1.676 m)   Wt 214 lb 12.8 oz (97.4 kg)   LMP 02/10/2012   SpO2 98%   BMI 34.67 kg/m      10/03/2024    2:39 PM 09/05/2024    3:14 PM 07/03/2024    3:48 PM  Vitals with BMI  Height 5' 6 5' 6 5' 6  Weight 214 lbs 13 oz 215 lbs 6 oz 212 lbs 13 oz  BMI 34.69 34.78  34.36  Systolic 135 138 877  Diastolic 78 72 75  Pulse 77 78 81     Physical Exam Vitals and nursing note reviewed.  Constitutional:      Appearance: Normal appearance. She is normal weight.  HENT:     Head: Normocephalic and atraumatic.  Cardiovascular:     Rate and Rhythm: Normal rate and regular rhythm.     Pulses: Normal pulses.     Heart sounds: Normal heart sounds.  Pulmonary:     Effort: Pulmonary effort is normal.     Breath sounds: Normal breath sounds.  Abdominal:     General: Bowel sounds are normal.     Palpations: Abdomen is soft. There is no hepatomegaly.     Tenderness: There is no abdominal tenderness.  Skin:    General: Skin is warm and dry.  Neurological:     General: No focal deficit present.     Mental Status: She is alert and oriented to person, place, and time. Mental status is at baseline.  Psychiatric:        Mood and Affect: Mood normal.        Behavior: Behavior normal.        Thought Content: Thought content normal.        Judgment: Judgment normal.     Assessment & Plan:  Type 2 diabetes mellitus with stage 2 chronic kidney disease, with long-term current use of insulin  (HCC)  Type 1 diabetes mellitus with hyperglycemia (HCC) Assessment & Plan: Followed by Endo. Last A1c 9.4%. GMI 7.9%. Will recheck today. Foot exam and eye exam UTD. PNA UTD. Counseled on importance of diet and exercise. Keep appts with Endo.    Mixed hyperlipidemia -     Lipid panel  Hypertension associated with diabetes [facility]) Assessment & Plan: Not taking Lisinopril . Counseled on medication compliance. Will resend to pharmacy. Recommend heart healthy diet such as Mediterranean diet with whole grains, fruits, vegetable, fish, lean meats, nuts, and olive oil. Limit salt. Encouraged moderate walking, 3-5 times/week for 30-50 minutes each session. Aim for at least 150 minutes.week. Goal should be pace of 3 miles/hours, or walking 1.5 miles in 30 minutes. Avoid tobacco  products. Avoid excess alcohol. Take medications as prescribed and bring medications and blood pressure log with cuff to each office visit. Seek medical care for chest pain, palpitations, shortness of breath with exertion, dizziness/lightheadedness, vision changes, recurrent headaches, or swelling of extremities. Follow up in 3 months.    Cigarette nicotine dependence without complication  Transaminitis Assessment & Plan: No offending medication identified. No recent viral illness however she is suffering from prolonged rhinosinusitis. Iron/TIBC/ferritin and hepatitis panel today. Will order RUQ  US .  Orders: -     US  ABDOMEN LIMITED RUQ (LIVER/GB); Future -     Iron, TIBC and Ferritin Panel -     Hepatic function panel     Follow up plan: Return in about 3 months (around 01/03/2025) for annual physical with labs 1 week prior.  Jeoffrey GORMAN Barrio, FNP

## 2024-10-03 NOTE — Assessment & Plan Note (Signed)
Lipids today

## 2024-10-03 NOTE — Assessment & Plan Note (Signed)
 No offending medication identified. No recent viral illness however she is suffering from prolonged rhinosinusitis. Iron/TIBC/ferritin and hepatitis panel today. Will order RUQ US .

## 2024-10-03 NOTE — Assessment & Plan Note (Signed)
 Not taking Lisinopril . Counseled on medication compliance. Will resend to pharmacy. Recommend heart healthy diet such as Mediterranean diet with whole grains, fruits, vegetable, fish, lean meats, nuts, and olive oil. Limit salt. Encouraged moderate walking, 3-5 times/week for 30-50 minutes each session. Aim for at least 150 minutes.week. Goal should be pace of 3 miles/hours, or walking 1.5 miles in 30 minutes. Avoid tobacco products. Avoid excess alcohol. Take medications as prescribed and bring medications and blood pressure log with cuff to each office visit. Seek medical care for chest pain, palpitations, shortness of breath with exertion, dizziness/lightheadedness, vision changes, recurrent headaches, or swelling of extremities. Follow up in 3 months.

## 2024-10-03 NOTE — Assessment & Plan Note (Signed)
 Followed by Endo. Last A1c 9.4%. GMI 7.9%. Will recheck today. Foot exam and eye exam UTD. PNA UTD. Counseled on importance of diet and exercise. Keep appts with Endo.

## 2024-10-04 ENCOUNTER — Ambulatory Visit: Payer: Self-pay | Admitting: Family Medicine

## 2024-10-04 ENCOUNTER — Encounter: Payer: Self-pay | Admitting: Family Medicine

## 2024-10-04 DIAGNOSIS — Z23 Encounter for immunization: Secondary | ICD-10-CM | POA: Diagnosis not present

## 2024-10-04 DIAGNOSIS — R932 Abnormal findings on diagnostic imaging of liver and biliary tract: Secondary | ICD-10-CM

## 2024-10-04 LAB — HEPATIC FUNCTION PANEL
AG Ratio: 1.5 (calc) (ref 1.0–2.5)
ALT: 17 U/L (ref 6–29)
AST: 13 U/L (ref 10–35)
Albumin: 3.8 g/dL (ref 3.6–5.1)
Alkaline phosphatase (APISO): 83 U/L (ref 37–153)
Bilirubin, Direct: 0.1 mg/dL (ref 0.0–0.2)
Globulin: 2.6 g/dL (ref 1.9–3.7)
Indirect Bilirubin: 0.2 mg/dL (ref 0.2–1.2)
Total Bilirubin: 0.3 mg/dL (ref 0.2–1.2)
Total Protein: 6.4 g/dL (ref 6.1–8.1)

## 2024-10-04 LAB — LIPID PANEL
Cholesterol: 182 mg/dL (ref <200)
HDL: 63 mg/dL (ref >=50)
LDL Cholesterol (Calc): 99 mg/dL
Non-HDL Cholesterol (Calc): 119 mg/dL (ref <130)
Total CHOL/HDL Ratio: 2.9 (calc) (ref <5.0)
Triglycerides: 108 mg/dL (ref <150)

## 2024-10-04 LAB — IRON,TIBC AND FERRITIN PANEL
%SAT: 17 % (ref 16–45)
Ferritin: 69 ng/mL (ref 16–232)
Iron: 45 ug/dL (ref 45–160)
TIBC: 268 ug/dL (ref 250–450)

## 2024-10-04 NOTE — Addendum Note (Signed)
 Addended by: CORINNA BRISKER R on: 10/04/2024 12:52 PM   Modules accepted: Orders

## 2024-10-10 ENCOUNTER — Ambulatory Visit
Admission: RE | Admit: 2024-10-10 | Discharge: 2024-10-10 | Disposition: A | Discharge status: Home / Self Care | Source: Ambulatory Visit | Attending: Family Medicine | Admitting: Family Medicine

## 2024-10-10 DIAGNOSIS — R7401 Elevation of levels of liver transaminase levels: Secondary | ICD-10-CM

## 2024-11-03 ENCOUNTER — Other Ambulatory Visit: Payer: Self-pay | Admitting: Obstetrics and Gynecology

## 2024-11-03 DIAGNOSIS — Z01818 Encounter for other preprocedural examination: Secondary | ICD-10-CM

## 2024-11-15 ENCOUNTER — Encounter: Payer: Self-pay | Admitting: Family Medicine

## 2024-11-15 ENCOUNTER — Other Ambulatory Visit: Payer: Self-pay | Admitting: Family Medicine

## 2024-11-16 ENCOUNTER — Other Ambulatory Visit: Payer: Self-pay | Admitting: Physician Assistant

## 2024-11-16 DIAGNOSIS — K219 Gastro-esophageal reflux disease without esophagitis: Secondary | ICD-10-CM

## 2024-11-20 ENCOUNTER — Other Ambulatory Visit: Payer: Self-pay | Admitting: Family Medicine

## 2024-11-20 MED ORDER — GVOKE HYPOPEN 2-PACK 1 MG/0.2ML ~~LOC~~ SOAJ: 1.0000 mg | Freq: Once | SUBCUTANEOUS | 1 refills | Status: DC | PRN

## 2024-11-21 ENCOUNTER — Other Ambulatory Visit: Payer: Self-pay | Admitting: Family Medicine

## 2024-11-24 ENCOUNTER — Other Ambulatory Visit: Payer: Self-pay | Admitting: Family Medicine

## 2024-11-27 ENCOUNTER — Other Ambulatory Visit: Payer: Self-pay | Admitting: Family Medicine

## 2024-11-29 ENCOUNTER — Encounter: Payer: Self-pay | Admitting: Family Medicine

## 2024-11-29 ENCOUNTER — Other Ambulatory Visit: Payer: Self-pay

## 2024-11-29 DIAGNOSIS — K219 Gastro-esophageal reflux disease without esophagitis: Secondary | ICD-10-CM

## 2024-11-29 MED ORDER — PANTOPRAZOLE SODIUM 40 MG PO TBEC: 40.0000 mg | DELAYED_RELEASE_TABLET | Freq: Every day | ORAL | 1 refills | Status: AC

## 2024-11-29 MED ORDER — FAMOTIDINE 40 MG PO TABS: 40.0000 mg | ORAL_TABLET | Freq: Every day | ORAL | 1 refills | Status: AC

## 2024-12-11 ENCOUNTER — Other Ambulatory Visit: Payer: Self-pay | Admitting: Family Medicine

## 2024-12-11 MED ORDER — GLUCOSE 5 G PO CHEW: 15.0000 g | CHEWABLE_TABLET | ORAL | 12 refills | Status: AC | PRN

## 2024-12-15 ENCOUNTER — Encounter: Payer: Self-pay | Admitting: Family Medicine

## 2024-12-28 ENCOUNTER — Encounter (HOSPITAL_COMMUNITY): Payer: Self-pay | Admitting: Obstetrics and Gynecology

## 2024-12-28 NOTE — Progress Notes (Signed)
 Spoke w/ via phone for pre-op interview--- Sheryl White needs dos----   T&S, CBC and UPT per surgeon. BMP, CBG, A1C per anesthesia.      [name]------ COVID test -----patient states asymptomatic no test needed Arrive at -------1000 NPO after MN NO Solid Food.  Clear liquids from MN until---0900 Pre-Surgery Ensure or G2:  Med rec completed Medications to take morning of surgery -----Bring Albuterol  inhaler. Wixela. and Protonix .  Diabetic medication -----Omnipod insulin  pump. And Cont. Glucose monitor upper adb. Pt stated she had instructions from endocrinologist Dr Cherilyn to decrease dose and suspend during surgery.  GLP1 agonist last dose: GLP1 instructions:  Patient instructed no nail polish to be worn day of surgery Patient instructed to bring photo id and insurance card day of surgery Patient aware to have Driver (ride ) / caregiver    for 24 hours after surgery - Alm Beans Patient Special Instructions ----- Pre-Op special Instructions -----NS IV fluid. Pt has CKD II history.  Patient verbalized understanding of instructions that were given at this phone interview. Patient denies chest pain, sob, fever, cough at the interview.

## 2025-01-01 ENCOUNTER — Other Ambulatory Visit

## 2025-01-01 DIAGNOSIS — Z0000 Encounter for general adult medical examination without abnormal findings: Secondary | ICD-10-CM

## 2025-01-02 ENCOUNTER — Encounter (HOSPITAL_COMMUNITY)
Admission: RE | Disposition: A | Discharge status: Home / Self Care | Payer: Self-pay | Source: Home / Self Care | Attending: Obstetrics and Gynecology

## 2025-01-02 ENCOUNTER — Ambulatory Visit (HOSPITAL_COMMUNITY)

## 2025-01-02 ENCOUNTER — Other Ambulatory Visit: Payer: Self-pay

## 2025-01-02 ENCOUNTER — Encounter (HOSPITAL_COMMUNITY): Payer: Self-pay | Admitting: Obstetrics and Gynecology

## 2025-01-02 ENCOUNTER — Ambulatory Visit (HOSPITAL_COMMUNITY)
Admission: RE | Admit: 2025-01-02 | Discharge: 2025-01-02 | Disposition: A | Discharge status: Home / Self Care | Attending: Obstetrics and Gynecology | Admitting: Obstetrics and Gynecology

## 2025-01-02 DIAGNOSIS — Z794 Long term (current) use of insulin: Secondary | ICD-10-CM | POA: Insufficient documentation

## 2025-01-02 DIAGNOSIS — I10 Essential (primary) hypertension: Secondary | ICD-10-CM | POA: Insufficient documentation

## 2025-01-02 DIAGNOSIS — N8110 Cystocele, unspecified: Secondary | ICD-10-CM | POA: Diagnosis not present

## 2025-01-02 DIAGNOSIS — E109 Type 1 diabetes mellitus without complications: Secondary | ICD-10-CM | POA: Diagnosis not present

## 2025-01-02 DIAGNOSIS — Z79899 Other long term (current) drug therapy: Secondary | ICD-10-CM | POA: Diagnosis not present

## 2025-01-02 DIAGNOSIS — N393 Stress incontinence (female) (male): Secondary | ICD-10-CM | POA: Diagnosis not present

## 2025-01-02 DIAGNOSIS — F17210 Nicotine dependence, cigarettes, uncomplicated: Secondary | ICD-10-CM | POA: Insufficient documentation

## 2025-01-02 DIAGNOSIS — Z01818 Encounter for other preprocedural examination: Secondary | ICD-10-CM

## 2025-01-02 DIAGNOSIS — F418 Other specified anxiety disorders: Secondary | ICD-10-CM | POA: Diagnosis not present

## 2025-01-02 DIAGNOSIS — E1065 Type 1 diabetes mellitus with hyperglycemia: Secondary | ICD-10-CM

## 2025-01-02 HISTORY — PX: CYSTOSCOPY: SHX5120

## 2025-01-02 HISTORY — PX: BLADDER SUSPENSION: SHX72

## 2025-01-02 HISTORY — PX: CYSTOCELE REPAIR: SHX163

## 2025-01-02 LAB — TYPE AND SCREEN
ABO/RH(D): O NEG
Antibody Screen: NEGATIVE

## 2025-01-02 LAB — GLUCOSE, CAPILLARY
Glucose-Capillary: 209 mg/dL — ABNORMAL HIGH (ref 70–99)
Glucose-Capillary: 202 mg/dL — ABNORMAL HIGH (ref 70–99)

## 2025-01-02 LAB — ABO/RH: ABO/RH(D): O NEG

## 2025-01-02 MED ORDER — ONDANSETRON HCL 4 MG/2ML IJ SOLN
INTRAMUSCULAR | Status: AC
  Filled 2025-01-02: qty 2

## 2025-01-02 MED ORDER — ROCURONIUM BROMIDE 10 MG/ML (PF) SYRINGE
PREFILLED_SYRINGE | INTRAVENOUS | Status: DC | PRN
  Administered 2025-01-02: 50 mg via INTRAVENOUS

## 2025-01-02 MED ORDER — LIDOCAINE 2% (20 MG/ML) 5 ML SYRINGE
INTRAMUSCULAR | Status: AC
  Filled 2025-01-02: qty 5

## 2025-01-02 MED ORDER — CEFAZOLIN SODIUM-DEXTROSE 2-4 GM/100ML-% IV SOLN
INTRAVENOUS | Status: AC
  Filled 2025-01-02: qty 100

## 2025-01-02 MED ORDER — PROPOFOL 10 MG/ML IV BOLUS
INTRAVENOUS | Status: AC
  Filled 2025-01-02: qty 20

## 2025-01-02 MED ORDER — PROPOFOL 10 MG/ML IV BOLUS
INTRAVENOUS | Status: DC | PRN
  Administered 2025-01-02: 170 mg via INTRAVENOUS

## 2025-01-02 MED ORDER — PHENYLEPHRINE 80 MCG/ML (10ML) SYRINGE FOR IV PUSH (FOR BLOOD PRESSURE SUPPORT)
PREFILLED_SYRINGE | INTRAVENOUS | Status: DC | PRN
  Administered 2025-01-02: 80 ug via INTRAVENOUS

## 2025-01-02 MED ORDER — OXYCODONE HCL 5 MG/5ML PO SOLN: 5.0000 mg | Freq: Once | ORAL | Status: DC | PRN

## 2025-01-02 MED ORDER — OXYCODONE-ACETAMINOPHEN 5-325 MG PO TABS: 1.0000 | ORAL_TABLET | ORAL | 0 refills | Status: AC | PRN

## 2025-01-02 MED ORDER — MIDAZOLAM HCL 2 MG/2ML IJ SOLN
INTRAMUSCULAR | Status: AC
  Filled 2025-01-02: qty 2

## 2025-01-02 MED ORDER — HEMOSTATIC AGENTS (NO CHARGE) OPTIME
TOPICAL | Status: DC | PRN
  Administered 2025-01-02: 1

## 2025-01-02 MED ORDER — CHLORHEXIDINE GLUCONATE 0.12 % MT SOLN: 15.0000 mL | Freq: Once | OROMUCOSAL | Status: AC

## 2025-01-02 MED ORDER — FENTANYL CITRATE (PF) 250 MCG/5ML IJ SOLN
INTRAMUSCULAR | Status: AC
  Filled 2025-01-02: qty 5

## 2025-01-02 MED ORDER — ORAL CARE MOUTH RINSE: 15.0000 mL | Freq: Once | OROMUCOSAL | Status: AC

## 2025-01-02 MED ORDER — LIDOCAINE-EPINEPHRINE 1 %-1:100000 IJ SOLN
INTRAMUSCULAR | Status: AC
  Filled 2025-01-02: qty 1

## 2025-01-02 MED ORDER — AMISULPRIDE (ANTIEMETIC) 5 MG/2ML IV SOLN: 10.0000 mg | Freq: Once | INTRAVENOUS | Status: DC | PRN

## 2025-01-02 MED ORDER — HYDROMORPHONE HCL 1 MG/ML IJ SOLN: 0.2500 mg | INTRAMUSCULAR | Status: DC | PRN

## 2025-01-02 MED ORDER — LACTATED RINGERS IV SOLN: INTRAVENOUS | Status: DC

## 2025-01-02 MED ORDER — LIDOCAINE 2% (20 MG/ML) 5 ML SYRINGE
INTRAMUSCULAR | Status: DC | PRN
  Administered 2025-01-02: 60 mg via INTRAVENOUS

## 2025-01-02 MED ORDER — DEXAMETHASONE SOD PHOSPHATE PF 10 MG/ML IJ SOLN
INTRAMUSCULAR | Status: AC
  Filled 2025-01-02: qty 1

## 2025-01-02 MED ORDER — POVIDONE-IODINE 10 % EX SWAB: 2.0000 | Freq: Once | CUTANEOUS | Status: DC

## 2025-01-02 MED ORDER — OXYCODONE HCL 5 MG PO TABS: 5.0000 mg | ORAL_TABLET | Freq: Once | ORAL | Status: DC | PRN

## 2025-01-02 MED ORDER — ROCURONIUM BROMIDE 10 MG/ML (PF) SYRINGE
PREFILLED_SYRINGE | INTRAVENOUS | Status: AC
  Filled 2025-01-02: qty 10

## 2025-01-02 MED ORDER — MIDAZOLAM HCL (PF) 2 MG/2ML IJ SOLN
INTRAMUSCULAR | Status: DC | PRN
  Administered 2025-01-02: 2 mg via INTRAVENOUS

## 2025-01-02 MED ORDER — 0.9 % SODIUM CHLORIDE (POUR BTL) OPTIME
TOPICAL | Status: DC | PRN
  Administered 2025-01-02: 1000 mL

## 2025-01-02 MED ORDER — SODIUM CHLORIDE 0.9 % IR SOLN
Status: DC | PRN
  Administered 2025-01-02: 1000 mL

## 2025-01-02 MED ORDER — PHENYLEPHRINE 80 MCG/ML (10ML) SYRINGE FOR IV PUSH (FOR BLOOD PRESSURE SUPPORT)
PREFILLED_SYRINGE | INTRAVENOUS | Status: AC
  Filled 2025-01-02: qty 10

## 2025-01-02 MED ORDER — DEXMEDETOMIDINE HCL IN NACL 80 MCG/20ML IV SOLN
INTRAVENOUS | Status: DC | PRN
  Administered 2025-01-02: 8 ug via INTRAVENOUS

## 2025-01-02 MED ORDER — SOD CITRATE-CITRIC ACID 500-334 MG/5ML PO SOLN: 30.0000 mL | ORAL | Status: DC

## 2025-01-02 MED ORDER — CHLORHEXIDINE GLUCONATE 0.12 % MT SOLN
OROMUCOSAL | Status: AC
  Administered 2025-01-02: 15 mL via OROMUCOSAL
  Filled 2025-01-02: qty 15

## 2025-01-02 MED ORDER — ESTRADIOL 0.01 % VA CREA
TOPICAL_CREAM | VAGINAL | Status: DC | PRN
  Administered 2025-01-02: 1 via VAGINAL

## 2025-01-02 MED ORDER — CEFAZOLIN SODIUM-DEXTROSE 2-4 GM/100ML-% IV SOLN
2.0000 g | INTRAVENOUS | Status: AC
  Administered 2025-01-02: 2 g via INTRAVENOUS

## 2025-01-02 MED ORDER — SUGAMMADEX SODIUM 200 MG/2ML IV SOLN
INTRAVENOUS | Status: DC | PRN
  Administered 2025-01-02: 200 mg via INTRAVENOUS

## 2025-01-02 MED ORDER — FENTANYL CITRATE (PF) 250 MCG/5ML IJ SOLN
INTRAMUSCULAR | Status: DC | PRN
  Administered 2025-01-02: 100 ug via INTRAVENOUS

## 2025-01-02 MED ORDER — LIDOCAINE-EPINEPHRINE (PF) 1 %-1:200000 IJ SOLN
INTRAMUSCULAR | Status: DC | PRN
  Administered 2025-01-02: 8 mL

## 2025-01-02 MED ORDER — ALBUTEROL SULFATE HFA 108 (90 BASE) MCG/ACT IN AERS
INHALATION_SPRAY | RESPIRATORY_TRACT | Status: DC | PRN
  Administered 2025-01-02: 4 via RESPIRATORY_TRACT

## 2025-01-02 MED ORDER — CEFUROXIME AXETIL 250 MG PO TABS: 250.0000 mg | ORAL_TABLET | Freq: Two times a day (BID) | ORAL | 0 refills | Status: AC

## 2025-01-02 NOTE — Anesthesia Preprocedure Evaluation (Signed)
"                                    Anesthesia Evaluation  Patient identified by MRN, date of birth, ID band Patient awake    Reviewed: Allergy & Precautions, H&P , NPO status , Patient's Chart, lab work & pertinent test results  Airway Mallampati: II  TM Distance: >3 FB Neck ROM: Full    Dental  (+) Dental Advisory Given   Pulmonary neg pulmonary ROS, Current Smoker and Patient abstained from smoking.   Pulmonary exam normal breath sounds clear to auscultation       Cardiovascular hypertension, Pt. on medications negative cardio ROS Normal cardiovascular exam Rhythm:Regular Rate:Normal     Neuro/Psych   Anxiety Depression    negative neurological ROS  negative psych ROS   GI/Hepatic Neg liver ROS,GERD  ,,  Endo/Other  negative endocrine ROSdiabetes, Type 1, Insulin  Dependent    Renal/GU negative Renal ROS  negative genitourinary   Musculoskeletal negative musculoskeletal ROS (+)    Abdominal   Peds negative pediatric ROS (+)  Hematology negative hematology ROS (+)   Anesthesia Other Findings   Reproductive/Obstetrics negative OB ROS                              Anesthesia Physical Anesthesia Plan  ASA: 3  Anesthesia Plan: General   Post-op Pain Management:    Induction: Intravenous  PONV Risk Score and Plan: 2 and Ondansetron , Midazolam  and Treatment may vary due to age or medical condition  Airway Management Planned: Oral ETT  Additional Equipment:   Intra-op Plan:   Post-operative Plan: Extubation in OR  Informed Consent: I have reviewed the patients History and Physical, chart, labs and discussed the procedure including the risks, benefits and alternatives for the proposed anesthesia with the patient or authorized representative who has indicated his/her understanding and acceptance.     Dental advisory given  Plan Discussed with: CRNA  Anesthesia Plan Comments:         Anesthesia Quick  Evaluation  "

## 2025-01-02 NOTE — Anesthesia Postprocedure Evaluation (Signed)
"   Anesthesia Post Note  Patient: Sheryl White  Procedure(s) Performed: URETHROPEXY, USING TRANSVAGINAL TAPE (Vagina ) COLPORRHAPHY, ANTERIOR, FOR CYSTOCELE REPAIR (Vagina ) CYSTOSCOPY (Bladder)     Patient location during evaluation: PACU Anesthesia Type: General Level of consciousness: awake and alert, oriented and patient cooperative Pain management: pain level controlled Vital Signs Assessment: post-procedure vital signs reviewed and stable Respiratory status: spontaneous breathing, nonlabored ventilation and respiratory function stable Cardiovascular status: blood pressure returned to baseline and stable Postop Assessment: no apparent nausea or vomiting Anesthetic complications: no   No notable events documented.  Last Vitals:  Vitals:   01/02/25 1400 01/02/25 1415  BP: (!) 148/71 (!) 151/69  Pulse: 80 77  Resp: 17 17  Temp:    SpO2: 93% 95%    Last Pain:  Vitals:   01/02/25 1415  TempSrc:   PainSc: 1                  [name]      "

## 2025-01-02 NOTE — H&P (View-Only) (Signed)
 There has been no change in the patients history, status or exam since the history and physical.  Vitals:   12/28/24 1036 01/02/25 1011  BP:  (!) 153/78  Pulse:  85  Resp:  17  Temp:  98 F (36.7 C)  TempSrc:  Oral  SpO2:  97%  Weight: 99.8 kg 99.8 kg  Height:  5' 6 (1.676 m)    Results for orders placed or performed during the hospital encounter of 01/02/25 (from the past 72 hours)  Type and screen     Status: None   Collection Time: 01/02/25 10:50 AM  Result Value Ref Range   ABO/RH(D) O NEG    Antibody Screen NEG    Sample Expiration      01/05/2025,2359 Performed at [facility], [address], [place], [place] [address]   ABO/Rh     Status: None   Collection Time: 01/02/25 11:05 AM  Result Value Ref Range   ABO/RH(D)      O NEG Performed at [facility], [address] [place], [place] [address]     Sheryl White

## 2025-01-02 NOTE — Transfer of Care (Signed)
 Immediate Anesthesia Transfer of Care Note  Patient: Sheryl White  Procedure(s) Performed: URETHROPEXY, USING TRANSVAGINAL TAPE (Vagina ) COLPORRHAPHY, ANTERIOR, FOR CYSTOCELE REPAIR (Vagina ) CYSTOSCOPY (Bladder)  Patient Location: PACU  Anesthesia Type:General  Level of Consciousness: drowsy and patient cooperative  Airway & Oxygen Therapy: Patient Spontanous Breathing and Patient connected to face mask oxygen  Post-op Assessment: Report given to RN and Post -op Vital signs reviewed and stable  Post vital signs: Reviewed and stable  Last Vitals:  Vitals Value Taken Time  BP 145/75 01/02/25 13:42  Temp 36.4 C 01/02/25 13:42  Pulse 82 01/02/25 13:44  Resp 19 01/02/25 13:44  SpO2 97 % 01/02/25 13:44  Vitals shown include unfiled device data.  Last Pain:  Vitals:   01/02/25 1011  TempSrc: Oral  PainSc: 0-No pain      Patients Stated Pain Goal: 4 (01/02/25 1011)  Complications: No notable events documented.

## 2025-01-02 NOTE — Interval H&P Note (Signed)
 History and Physical Interval Note:  01/02/2025 12:24 PM  Sheryl White  has presented today for surgery, with the diagnosis of sui.  The various methods of treatment have been discussed with the patient and family. After consideration of risks, benefits and other options for treatment, the patient has consented to  Procedures: URETHROPEXY, USING TRANSVAGINAL TAPE (N/A) COLPORRHAPHY, ANTERIOR, FOR CYSTOCELE REPAIR (N/A) CYSTOSCOPY (N/A) as a surgical intervention.  The patient's history has been reviewed, patient examined, no change in status, stable for surgery.  I have reviewed the patient's chart and labs.  Questions were answered to the patient's satisfaction.     [name]

## 2025-01-02 NOTE — Brief Op Note (Signed)
 01/02/2025 12:00 PM  1:50 PM  PATIENT:  Andrea JONETTA Senters  49 y.o. female  PRE-OPERATIVE DIAGNOSIS:  sui  POST-OPERATIVE DIAGNOSIS:  sui  PROCEDURE:  Procedures: URETHROPEXY, USING TRANSVAGINAL TAPE (N/A) COLPORRHAPHY, ANTERIOR, FOR CYSTOCELE REPAIR (N/A) CYSTOSCOPY (N/A)  SURGEON:  Surgeons and Role:    * Sarrah Browning, MD - Primary    * Diedre Browning BRAVO, MD - Assisting   ANESTHESIA:   general  EBL:  20 mL   BLOOD ADMINISTERED:none  DRAINS: Urinary Catheter (Foley)   LOCAL MEDICATIONS USED:  LIDOCAINE  with epi, surgiflo and estrace   SPECIMEN:  No Specimen  DISPOSITION OF SPECIMEN:  PATHOLOGY  COUNTS:  YES  TOURNIQUET:  * No tourniquets in log *  DICTATION: .Note written in EPIC  PLAN OF CARE: Discharge to home after PACU  PATIENT DISPOSITION:  PACU - hemodynamically stable.   Delay start of Pharmacological VTE agent (>24hrs) due to surgical blood loss or risk of bleeding: not applicable

## 2025-01-02 NOTE — Op Note (Signed)
 01/02/2025 12:00 PM  1:50 PM  PATIENT:  Sheryl White  49 y.o. female  PRE-OPERATIVE DIAGNOSIS:  sui  POST-OPERATIVE DIAGNOSIS:  sui  PROCEDURE:  Procedures: URETHROPEXY, USING TRANSVAGINAL TAPE (N/A) COLPORRHAPHY, ANTERIOR, FOR CYSTOCELE REPAIR (N/A) CYSTOSCOPY (N/A)  SURGEON:  Surgeons and Role:    * Sarrah Browning, MD - Primary    * Diedre Browning BRAVO, MD - Assisting   ANESTHESIA:   general  EBL:  20 mL   BLOOD ADMINISTERED:none  DRAINS: Urinary Catheter (Foley)   LOCAL MEDICATIONS USED:  LIDOCAINE  with epi, surgiflo and estrace   SPECIMEN:  No Specimen  DISPOSITION OF SPECIMEN:  PATHOLOGY  COUNTS:  YES  TOURNIQUET:  * No tourniquets in log *  DICTATION: .Note written in EPIC  PLAN OF CARE: Discharge to home after PACU  PATIENT DISPOSITION:  PACU - hemodynamically stable.   Delay start of Pharmacological VTE agent (>24hrs) due to surgical blood loss or risk of bleeding: not applicable  Complications: none  Technique:  After general anesthesia was administered, the patient was prepped and draped in the usual sterile fashion.  The uterus was well suspended and we elected to leave it in place.  A foley was placed in the urethra and the apex of the cystocele grasped with two allises.  Allises were used to grasp the vaginal mucosa in the midline superior to inferior just below the urethral opening.  The mucosa was injected with 1% lidocaine  with epi in the midline and a scalpel used to incise the vaginal mucosa vertically.  Allises were used to retract the vaginal mucosa as the vesico-vaginal fascia was removed from the mucosa with blunt and sharp dissection and adequate room midurethral to pelvic floor bilaterally was developed.  Two small puncture incisions were then made two cm from midline just above the pubic symphysis.  The abdominal needles from the [employer]ne[employer] TVT set were introduced behind the pubic bone, through the pelvic floor and out the vagina  carefully on each respective side, being careful to hug the posterior of the pubic bone.  The foley was removed.  Cystoscopy revealed excellent bilateral spill from each ureteral orifice and NO NEEDLES IN THE BLADDER.   The urethra was clear as well.  The foley was replaced and the vaginal needles attached to the abdominal needles.  The tape was pulled into place through the abdominal incisions, the sheaths removed while a kelly was in place under the mesh sling and the tape cut at the level of just below the skin.  The tape was checked and found to be tight enough and laying flat.   Surgiflo was placed in the corners up by the pelvic floor to achieve hemostasis of some small bleeders out of reach and too close to the sling for stitches.  Once hemostasis was achieved, one mattress stitches of 0-vicryl were placed to close the vesico-vaginal fascia under the bladder.  The vaginal mucosa was trimmed and then closed with a running lock stitch of 2-0 vicryl.  A vaginal packing with estrace  was placed and the patient returned to the recovery room in stable condition.

## 2025-01-02 NOTE — H&P (Signed)
 " 49 y.o. [name] complains of stress incontinence and cystocele.  PRE OP today for bladder surgery on 01/02/25; pt is still smoking 1/2 a pack a day -BM//  Previously: Genitourinary: no hematuria, no abnormal bleeding, incontinence (worsening now, 3-4 times a day. Leaking with speaking, bending over or cough. No urge at all. No prolapse sx. 2025 about ready to fix, leaking 5-6 times a day.)  Pt counseled to quit tob.   Past Medical History:  Diagnosis Date   Angina    Anxiety    Depression    Diabetes mellitus    Dyslipidemia    Endometriosis    Essential hypertension 03/14/2022   GERD (gastroesophageal reflux disease)    Ovarian cyst    Type I diabetes mellitus (HCC)    Past Surgical History:  Procedure Laterality Date   ABDOMINAL HYSTERECTOMY     still have left ovary   CARPAL TUNNEL RELEASE     bilateral   CESAREAN SECTION     x 1   COLONOSCOPY     ENDOMETRIAL ABLATION     LAPAROSCOPIC SALPINGOOPHERECTOMY     svd     x 1   TRIGGER FINGER RELEASE     bilateral   TUBAL LIGATION     UPPER GASTROINTESTINAL ENDOSCOPY     WISDOM TOOTH EXTRACTION      Social History   Socioeconomic History   Marital status: Single    Spouse name: Not on file   Number of children: 2   Years of education: Not on file   Highest education level: Not on file  Occupational History   Occupation: [employer]    Employer: [employer]  Tobacco Use   Smoking status: Every Day    Current packs/day: 1.00    Average packs/day: 1 pack/day for 5.0 years (5.0 ttl pk-yrs)    Types: Cigarettes   Smokeless tobacco: Never  Vaping Use   Vaping status: Never Used  Substance and Sexual Activity   Alcohol use: Yes    Alcohol/week: 2.0 - 3.0 standard drinks of alcohol    Types: 2 - 3 Standard drinks or equivalent per week    Comment: occasionally   Drug use: No   Sexual activity: Yes    Birth control/protection: Surgical  Other Topics Concern   Not on file  Social History  Narrative   Not on file   Social Drivers of Health   Tobacco Use: High Risk (12/28/2024)   Patient History    Smoking Tobacco Use: Every Day    Smokeless Tobacco Use: Never    Passive Exposure: Not on file  Financial Resource Strain: Low Risk  (10/12/2024)   Received from [facility]   Overall Financial Resource Strain (CARDIA)    Difficulty of Paying Living Expenses: Not hard at all  Food Insecurity: No Food Insecurity (10/12/2024)   Received from [facility]   Epic    Within the past 12 months, you worried that your food would run out before you got the money to buy more.: Never true    Within the past 12 months, the food you bought just didn't last and you didn't have money to get more.: Never true  Transportation Needs: No Transportation Needs (10/12/2024)   Received from [facility] - Transportation    In the past 12 months, has lack of transportation kept you from medical appointments or from getting medications?: No  Lack of Transportation (Non-Medical): No  Physical Activity: Not on file  Stress: Not on file  Social Connections: Not on file  Intimate Partner Violence: Not At Risk (01/26/2024)   Humiliation, Afraid, Rape, and Kick questionnaire    Fear of Current or Ex-Partner: No    Emotionally Abused: No    Physically Abused: No    Sexually Abused: No  Depression (PHQ2-9): Low Risk (02/10/2024)   Depression (PHQ2-9)    PHQ-2 Score: 4  Alcohol Screen: Not on file  Housing: Low Risk  (10/12/2024)   Received from [facility]   Epic    In the last 12 months, was there a time when you were not able to pay the mortgage or rent on time?: No    In the past 12 months, how many times have you moved where you were living?: 0    At any time in the past 12 months, were you homeless or living in a shelter (including now)?: No  Utilities: Not At Risk (10/12/2024)   Received from [facility]   Epic    In the past 12 months has the electric, gas, oil, or water company threatened to shut off services in your home?: No  Health Literacy: Not on file    Medications Ordered Prior to Encounter[1]  Allergies[2]  Vitals:   12/28/24 1036  Weight: 99.8 kg    Lungs: clear to ascultation Cor:  RRR Abdomen:  soft, nontender, nondistended. Ex:  no cords, erythema Pelvic:    Vulva: no masses, no atrophy, no lesions Vagina: no tenderness, no erythema, no abnormal vaginal discharge, no vesicle(s) or ulcers, cystocele (0), rectocele (mild) (pap done) Bladder/Urethra: no urethral discharge, no urethral mass, bladder non distended, Urethra hypermobile (>>45) Adnexa/Parametria: no parametrial tenderness, no parametrial mass, no adnexal tenderness, no ovarian mass  A:  Hypermobile urethral without significant prolapse or rectocele.  For TVT/Arepair/cysto.   P: P: All risks, benefits and alternatives d/w patient and she desires to proceed.  Patient has undergone an ERAS protocol and will receive preop antibiotics and SCDs during the operation.   Pt to have extended recovery but will go home same day if eating, ambulating, and pain control is good.   Voiding trial before d/c.  Sheryl White    [1]  No current facility-administered medications on file prior to encounter.   Current Outpatient Medications on File Prior to Encounter  Medication Sig Dispense Refill   acetaminophen  (TYLENOL ) 500 MG tablet Take 1,000 mg by mouth every 6 (six) hours as needed for moderate pain (pain score 4-6) or mild pain (pain score 1-3).     albuterol  (VENTOLIN  HFA) 108 (90 Base) MCG/ACT inhaler Inhale 1-2 puffs into the lungs every 6 (six) hours as needed for wheezing or shortness of breath.     calcium  carbonate (ALKA-SELTZER HEARTBURN) 750 MG chewable tablet Chew 1 tablet by mouth daily as needed for heartburn.     lisinopril  (ZESTRIL ) 5 MG tablet Take 5 mg by mouth at bedtime.      NOVOLOG  100 UNIT/ML injection Sliding scale through pump     Phenyleph-Doxylamine-DM-APAP (NYQUIL SEVERE COLD/FLU) 5-6.25-10-325 MG/15ML LIQD Take 30 mLs by mouth at bedtime as needed.     predniSONE (STERAPRED UNI-PAK 21 TAB) 10 MG (21) TBPK tablet Take 10 mg by mouth. As directed on package     rosuvastatin (CRESTOR) 5 MG tablet Take 5 mg by mouth at bedtime.     sodium chloride  (OCEAN) 0.65 %  SOLN nasal spray Place 1 spray into both nostrils daily as needed for congestion.     Vitamin D , Ergocalciferol , (DRISDOL) 1.25 MG (50000 UNIT) CAPS capsule Take 50,000 Units by mouth once a week.     WIXELA INHUB 250-50 MCG/ACT AEPB Inhale 1 puff into the lungs 2 (two) times daily.     ASPIRIN LOW DOSE 81 MG tablet TAKE 1 TABLET BY MOUTH EVERY DAY AFTER DINNER (Patient not taking: Reported on 12/26/2024) 120 tablet 3   Continuous Glucose Sensor (DEXCOM G7 SENSOR) MISC Use 1 each every 10 (ten) days     Insulin  Disposable Pump (OMNIPOD 5 DEXG7G6 PODS GEN 5) MISC INJECT 1 POD SUBCUTANEOUSLY EVERY OTHER DAY     Insulin  Human (INSULIN  PUMP) SOLN Inject 1 each into the skin 3 times daily with meals, bedtime and 2 AM.     methocarbamol  (ROBAXIN ) 750 MG tablet Take 1 tablet (750 mg total) by mouth 4 (four) times daily. (Patient not taking: Reported on 12/26/2024) 30 tablet 0  [2]  Allergies Allergen Reactions   Tape Rash    Plastic tape   "

## 2025-01-02 NOTE — Anesthesia Procedure Notes (Signed)
 Procedure Name: Intubation Date/Time: 01/02/2025 12:46 PM  Performed by: Hedy Jarred, CRNAPre-anesthesia Checklist: Patient identified, Emergency Drugs available, Suction available and Patient being monitored Patient Re-evaluated:Patient Re-evaluated prior to induction Oxygen Delivery Method: Circle System Utilized Preoxygenation: Pre-oxygenation with 100% oxygen Induction Type: IV induction Ventilation: Mask ventilation without difficulty Laryngoscope Size: Miller and 2 Grade View: Grade I Tube type: Oral Tube size: 7.0 mm Number of attempts: 1 Airway Equipment and Method: Stylet and Oral airway Placement Confirmation: ETT inserted through vocal cords under direct vision, positive ETCO2 and breath sounds checked- equal and bilateral Secured at: 21 cm Tube secured with: Tape Dental Injury: Teeth and Oropharynx as per pre-operative assessment

## 2025-01-02 NOTE — Progress Notes (Signed)
 There has been no change in the patients history, status or exam since the history and physical.  Vitals:   12/28/24 1036 01/02/25 1011  BP:  (!) 153/78  Pulse:  85  Resp:  17  Temp:  98 F (36.7 C)  TempSrc:  Oral  SpO2:  97%  Weight: 99.8 kg 99.8 kg  Height:  5' 6 (1.676 m)    Results for orders placed or performed during the hospital encounter of 01/02/25 (from the past 72 hours)  Type and screen     Status: None   Collection Time: 01/02/25 10:50 AM  Result Value Ref Range   ABO/RH(D) O NEG    Antibody Screen NEG    Sample Expiration      01/05/2025,2359 Performed at [facility], [address], [place], [place] [address]   ABO/Rh     Status: None   Collection Time: 01/02/25 11:05 AM  Result Value Ref Range   ABO/RH(D)      O NEG Performed at [facility], [address] [place], [place] [address]     Sheryl White

## 2025-01-02 NOTE — Discharge Summary (Addendum)
 Physician Discharge Summary  Patient ID: Sheryl White MRN: [id number] DOB/AGE: 02/07/1976 49 y.o.  Admit date: 01/02/2025 Discharge date: 01/02/2025  Admission Diagnoses:  Discharge Diagnoses:  Active Problems:   * No active hospital problems. *   Discharged Condition: good  Hospital Course: uncomplicated TVT, anterior repair and cysto  Consults: None  Significant Diagnostic Studies: none  Treatments: surgery:  uncomplicated TVT, anterior repair and cysto  Discharge Exam: Blood pressure (!) 151/69, pulse 77, temperature 97.6 F (36.4 C), resp. rate 17, height 5' 6 (1.676 m), weight 99.8 kg, last menstrual period 02/10/2012, SpO2 95%.   Disposition: Discharge disposition: 01-Home or Self Care       Discharge Instructions     Call MD for:  temperature >100.4   Complete by: As directed    Discharge instructions   Complete by: As directed    No driving on narcotics, no sexual activity for 2 weeks.   Increase activity slowly   Complete by: As directed    May shower / Bathe   Complete by: As directed    Shower, no bath for 2 weeks.   Remove dressing in 24 hours   Complete by: As directed    Sexual Activity Restrictions   Complete by: As directed    No sexual activity for 2 weeks.      Allergies as of 01/02/2025       Reactions   Tape Rash   Plastic tape        Medication List     STOP taking these medications    predniSONE 10 MG (21) Tbpk tablet Commonly known as: STERAPRED UNI-PAK 21 TAB       TAKE these medications    acetaminophen  500 MG tablet Commonly known as: TYLENOL  Take 1,000 mg by mouth every 6 (six) hours as needed for moderate pain (pain score 4-6) or mild pain (pain score 1-3).   albuterol  108 (90 Base) MCG/ACT inhaler Commonly known as: VENTOLIN  HFA Inhale 1-2 puffs into the lungs every 6 (six) hours as needed for wheezing or shortness of breath.   Alka-Seltzer Heartburn 750 MG chewable tablet Generic drug: calcium   carbonate Chew 1 tablet by mouth daily as needed for heartburn.   Aspirin Low Dose 81 MG tablet Generic drug: aspirin EC TAKE 1 TABLET BY MOUTH EVERY DAY AFTER DINNER   cefUROXime  250 MG tablet Commonly known as: CEFTIN  Take 1 tablet (250 mg total) by mouth 2 (two) times daily with a meal.   Dexcom G7 Sensor Misc Use 1 each every 10 (ten) days   famotidine  40 MG tablet Commonly known as: PEPCID  Take 1 tablet (40 mg total) by mouth at bedtime.   glucose 5 g chewable tablet Chew 3 tablets (15 g total) by mouth as needed for low blood sugar.   Gvoke HypoPen  2-Pack 1 MG/0.2ML Soaj Generic drug: Glucagon  INJECT 1 MG INTO THE SKIN ONCE AS NEEDED FOR UP TO 1 DOSE (HYPOGLYCEMIA). MAY REPEAT IN 15 MINUTES   insulin  pump Soln Inject 1 each into the skin 3 times daily with meals, bedtime and 2 AM.   lisinopril  5 MG tablet Commonly known as: ZESTRIL  Take 5 mg by mouth at bedtime.   methocarbamol  750 MG tablet Commonly known as: ROBAXIN  Take 1 tablet (750 mg total) by mouth 4 (four) times daily.   NovoLOG  100 UNIT/ML injection Generic drug: insulin  aspart Sliding scale through pump   NyQuil Severe Cold/Flu 5-6.25-10-325 MG/15ML Liqd Generic drug: Phenyleph-Doxylamine-DM-APAP Take 30 mLs by mouth  at bedtime as needed.   Omnipod 5 DexG7G6 Pods Gen 5 Misc INJECT 1 POD SUBCUTANEOUSLY EVERY OTHER DAY   oxyCODONE -acetaminophen  5-325 MG tablet Commonly known as: PERCOCET/ROXICET Take 1 tablet by mouth every 4 (four) hours as needed.   pantoprazole  40 MG tablet Commonly known as: PROTONIX  Take 1 tablet (40 mg total) by mouth daily before breakfast.   rosuvastatin 5 MG tablet Commonly known as: CRESTOR Take 5 mg by mouth at bedtime.   sodium chloride  0.65 % Soln nasal spray Commonly known as: OCEAN Place 1 spray into both nostrils daily as needed for congestion.   Vitamin D  (Ergocalciferol ) 1.25 MG (50000 UNIT) Caps capsule Commonly known as: DRISDOL Take 50,000 Units by  mouth once a week.   Wixela Inhub 250-50 MCG/ACT Aepb Generic drug: fluticasone-salmeterol Inhale 1 puff into the lungs 2 (two) times daily.        Follow-up Information     Sarrah Browning, MD Follow up in 2 week(s).   Specialty: Obstetrics and Gynecology Contact information: [address] [place] [address] [place] [address] [phone]                 Signed: Browning DELENA Sarrah 01/02/2025, 4:30 PM

## 2025-01-03 ENCOUNTER — Ambulatory Visit: Payer: Self-pay | Admitting: Family Medicine

## 2025-01-03 ENCOUNTER — Encounter (HOSPITAL_COMMUNITY): Payer: Self-pay | Admitting: Obstetrics and Gynecology

## 2025-01-05 LAB — COMPREHENSIVE METABOLIC PANEL WITH GFR
AG Ratio: 1.4 (calc) (ref 1.0–2.5)
ALT: 15 U/L (ref 6–29)
AST: 12 U/L (ref 10–35)
Albumin: 3.7 g/dL (ref 3.6–5.1)
Alkaline phosphatase (APISO): 120 U/L (ref 37–153)
BUN: 24 mg/dL (ref 7–25)
CO2: 27 mmol/L (ref 20–32)
Calcium: 9.1 mg/dL (ref 8.6–10.4)
Chloride: 102 mmol/L (ref 98–110)
Creat: 0.99 mg/dL (ref 0.50–1.03)
Globulin: 2.6 g/dL (ref 1.9–3.7)
Glucose, Bld: 183 mg/dL — ABNORMAL HIGH (ref 65–99)
Potassium: 4.7 mmol/L (ref 3.5–5.3)
Sodium: 136 mmol/L (ref 135–146)
Total Bilirubin: 0.3 mg/dL (ref 0.2–1.2)
Total Protein: 6.3 g/dL (ref 6.1–8.1)
eGFR: 69 mL/min/1.73m2

## 2025-01-05 LAB — HEMOGLOBIN A1C
Hgb A1c MFr Bld: 9 % — ABNORMAL HIGH
Mean Plasma Glucose: 212 mg/dL
eAG (mmol/L): 11.7 mmol/L

## 2025-01-05 LAB — LIPID PANEL
Cholesterol: 172 mg/dL
HDL: 88 mg/dL
LDL Cholesterol (Calc): 68 mg/dL
Non-HDL Cholesterol (Calc): 84 mg/dL
Total CHOL/HDL Ratio: 2 (calc)
Triglycerides: 81 mg/dL

## 2025-01-05 LAB — CBC WITH DIFFERENTIAL/PLATELET
Absolute Lymphocytes: 2897 {cells}/uL (ref 850–3900)
Absolute Monocytes: 983 {cells}/uL — ABNORMAL HIGH (ref 200–950)
Basophils Absolute: 52 {cells}/uL (ref 0–200)
Basophils Relative: 0.6 %
Eosinophils Absolute: 435 {cells}/uL (ref 15–500)
Eosinophils Relative: 5 %
HCT: 41.9 % (ref 35.9–46.0)
Hemoglobin: 13.9 g/dL (ref 11.7–15.5)
MCH: 30.5 pg (ref 27.0–33.0)
MCHC: 33.2 g/dL (ref 31.6–35.4)
MCV: 92.1 fL (ref 81.4–101.7)
MPV: 9.7 fL (ref 7.5–12.5)
Monocytes Relative: 11.3 %
Neutro Abs: 4333 {cells}/uL (ref 1500–7800)
Neutrophils Relative %: 49.8 %
Platelets: 405 Thousand/uL — ABNORMAL HIGH (ref 140–400)
RBC: 4.55 Million/uL (ref 3.80–5.10)
RDW: 12.2 % (ref 11.0–15.0)
Total Lymphocyte: 33.3 %
WBC: 8.7 Thousand/uL (ref 3.8–10.8)

## 2025-01-05 LAB — TSH: TSH: 0.57 m[IU]/L

## 2025-01-05 LAB — TEST AUTHORIZATION

## 2025-01-05 LAB — VITAMIN D 25 HYDROXY (VIT D DEFICIENCY, FRACTURES): Vit D, 25-Hydroxy: 49 ng/mL (ref 30–100)

## 2025-01-09 ENCOUNTER — Encounter: Admitting: Family Medicine

## 2025-04-11 ENCOUNTER — Other Ambulatory Visit

## 2025-05-03 ENCOUNTER — Encounter: Admitting: Family Medicine
